# Patient Record
Sex: Male | Born: 1955 | Race: Black or African American | Hispanic: No | Marital: Married
Health system: Midwestern US, Community
[De-identification: ages and names within clinical notes are randomized; demographics above are authoritative.]

## PROBLEM LIST (undated history)

## (undated) DIAGNOSIS — G40409 Other generalized epilepsy and epileptic syndromes, not intractable, without status epilepticus: Principal | ICD-10-CM

## (undated) DIAGNOSIS — Z940 Kidney transplant status: Secondary | ICD-10-CM

## (undated) DIAGNOSIS — E119 Type 2 diabetes mellitus without complications: Secondary | ICD-10-CM

## (undated) DIAGNOSIS — I10 Essential (primary) hypertension: Secondary | ICD-10-CM

## (undated) DIAGNOSIS — I4891 Unspecified atrial fibrillation: Secondary | ICD-10-CM

## (undated) DIAGNOSIS — E785 Hyperlipidemia, unspecified: Secondary | ICD-10-CM

## (undated) DIAGNOSIS — F0150 Vascular dementia without behavioral disturbance: Secondary | ICD-10-CM

## (undated) HISTORY — PX: OTHER SURGICAL HISTORY: SHX169

## (undated) HISTORY — PX: KIDNEY TRANSPLANT: SHX239

---

## 2016-05-27 ENCOUNTER — Inpatient Hospital Stay

## 2016-05-27 ENCOUNTER — Inpatient Hospital Stay
Admit: 2016-05-27 | Discharge: 2016-05-29 | Disposition: A | Discharge status: Home / Self Care | Attending: Internal Medicine | Admitting: Internal Medicine

## 2016-05-27 ENCOUNTER — Emergency Department: Admit: 2016-05-27 | Primary: Family Medicine

## 2016-05-27 DIAGNOSIS — G40909 Epilepsy, unspecified, not intractable, without status epilepticus: Secondary | ICD-10-CM

## 2016-05-27 LAB — METABOLIC PANEL, BASIC
Anion gap: 11 mmol/L (ref 5–15)
BUN: 42 mg/dl — ABNORMAL HIGH (ref 7–25)
CO2: 22 mEq/L (ref 21–32)
Calcium: 10.1 mg/dl (ref 8.5–10.1)
Chloride: 104 mEq/L (ref 98–107)
Creatinine: 2.1 mg/dl — ABNORMAL HIGH (ref 0.6–1.3)
GFR est AA: 42
GFR est non-AA: 35
Glucose: 165 mg/dl — ABNORMAL HIGH (ref 74–106)
Potassium: 4.4 mEq/L (ref 3.5–5.1)
Sodium: 136 mEq/L (ref 136–145)

## 2016-05-27 LAB — PROTHROMBIN TIME + INR
INR: 2.2 — ABNORMAL HIGH (ref 0.1–1.1)
Prothrombin time: 24.7 seconds — ABNORMAL HIGH (ref 10.2–12.9)

## 2016-05-27 LAB — CBC WITH AUTOMATED DIFF
BASOPHILS: 0.3 % (ref 0–3)
EOSINOPHILS: 0.5 % (ref 0–5)
HCT: 45.7 % (ref 37.0–50.0)
HGB: 14.2 gm/dl (ref 12.4–17.2)
LYMPHOCYTES: 10.9 % — ABNORMAL LOW (ref 28–48)
MCH: 24.4 pg (ref 23.0–34.6)
MCHC: 31.1 gm/dl (ref 30.0–36.0)
MCV: 78.4 fL — ABNORMAL LOW (ref 80.0–98.0)
MONOCYTES: 5.1 % (ref 1–13)
NEUTROPHILS: 82.7 % — ABNORMAL HIGH (ref 34–64)
PLATELET: 168 10*3/uL (ref 140–450)
RBC Morphology: NORMAL
RBC: 5.83 M/uL — ABNORMAL HIGH (ref 3.80–5.70)
WBC: 6.1 10*3/uL (ref 4.0–11.0)

## 2016-05-27 LAB — POC URINE MACROSCOPIC
Bilirubin: NEGATIVE
Glucose: NEGATIVE mg/dl
Ketone: NEGATIVE mg/dl
Nitrites: NEGATIVE
Protein: 100 mg/dl — AB
Specific gravity: 1.02 (ref 1.005–1.030)
Urobilinogen: 0.2 EU/dl (ref 0.0–1.0)
pH (UA): 5.5 (ref 5–9)

## 2016-05-27 LAB — POC URINE MICROSCOPIC

## 2016-05-27 LAB — TROPONIN I
Troponin-I: <0.015 ng/ml (ref 0.000–0.045)
Troponin-I: <0.015 ng/ml (ref 0.000–0.045)

## 2016-05-27 MED ORDER — ONDANSETRON (PF) 4 MG/2 ML INJECTION
4 mg/2 mL | Freq: Four times a day (QID) | INTRAMUSCULAR | Status: DC | PRN
Start: 2016-05-27 — End: 2016-05-29
  Administered 2016-05-27 – 2016-05-28 (×2): via INTRAVENOUS

## 2016-05-27 MED ORDER — HYDRALAZINE 20 MG/ML IJ SOLN
20 mg/mL | Freq: Four times a day (QID) | INTRAMUSCULAR | Status: DC | PRN
Start: 2016-05-27 — End: 2016-05-27

## 2016-05-27 MED ORDER — NALOXONE 0.4 MG/ML INJECTION
0.4 mg/mL | INTRAMUSCULAR | Status: DC | PRN
Start: 2016-05-27 — End: 2016-05-29

## 2016-05-27 MED ORDER — CLONIDINE 0.2 MG TAB
0.2 mg | Freq: Three times a day (TID) | ORAL | Status: DC | PRN
Start: 2016-05-27 — End: 2016-05-27

## 2016-05-27 MED ORDER — LABETALOL 5 MG/ML IV SOLN
5 mg/mL | INTRAVENOUS | Status: AC
Start: 2016-05-27 — End: 2016-05-27
  Administered 2016-05-27: 18:00:00 via INTRAVENOUS

## 2016-05-27 MED ORDER — ALBUTEROL SULFATE 0.083 % (0.83 MG/ML) SOLN FOR INHALATION
2.5 mg /3 mL (0.083 %) | Freq: Four times a day (QID) | RESPIRATORY_TRACT | Status: DC | PRN
Start: 2016-05-27 — End: 2016-05-29

## 2016-05-27 MED ORDER — SODIUM CHLORIDE 0.9 % IJ SYRG
Freq: Once | INTRAMUSCULAR | Status: AC
Start: 2016-05-27 — End: 2016-05-27
  Administered 2016-05-27: 14:00:00 via INTRAVENOUS

## 2016-05-27 MED ORDER — ALUM-MAG HYDROXIDE-SIMETH 200 MG-200 MG-20 MG/5 ML ORAL SUSP
200-200-20 mg/5 mL | ORAL | Status: DC | PRN
Start: 2016-05-27 — End: 2016-05-29

## 2016-05-27 MED ORDER — LEVETIRACETAM IN SOD CHLORIDE (ISO-OSM) 500 MG/100 ML IV PIGGY BACK
500 mg/100 mL | INTRAVENOUS | Status: AC
Start: 2016-05-27 — End: 2016-05-27
  Administered 2016-05-27: 16:00:00 via INTRAVENOUS

## 2016-05-27 MED ORDER — ONDANSETRON (PF) 4 MG/2 ML INJECTION
4 mg/2 mL | Freq: Once | INTRAMUSCULAR | Status: AC
Start: 2016-05-27 — End: 2016-05-27
  Administered 2016-05-27: 16:00:00 via INTRAVENOUS

## 2016-05-27 MED ORDER — SODIUM CHLORIDE 0.9 % IJ SYRG
INTRAMUSCULAR | Status: DC | PRN
Start: 2016-05-27 — End: 2016-05-29

## 2016-05-27 MED ORDER — MYCOPHENOLIC ACID 180 MG TAB, DELAYED RELEASE
180 mg | Freq: Two times a day (BID) | ORAL | Status: DC
Start: 2016-05-27 — End: 2016-05-29
  Administered 2016-05-28 – 2016-05-29 (×4): via ORAL

## 2016-05-27 MED ORDER — LEVETIRACETAM 500 MG TAB
500 mg | Freq: Two times a day (BID) | ORAL | Status: DC
Start: 2016-05-27 — End: 2016-05-29
  Administered 2016-05-28 – 2016-05-29 (×4): via ORAL

## 2016-05-27 MED ORDER — SERTRALINE 50 MG TAB
50 mg | Freq: Every day | ORAL | Status: DC
Start: 2016-05-27 — End: 2016-05-29
  Administered 2016-05-28 – 2016-05-29 (×3): via ORAL

## 2016-05-27 MED ORDER — OMEPRAZOLE 20 MG CAP, DELAYED RELEASE
20 mg | Freq: Every day | ORAL | Status: DC
Start: 2016-05-27 — End: 2016-05-29
  Administered 2016-05-29 (×2): via ORAL

## 2016-05-27 MED ORDER — PRAVASTATIN 10 MG TAB
10 mg | Freq: Every evening | ORAL | Status: DC
Start: 2016-05-27 — End: 2016-05-29
  Administered 2016-05-29: 01:00:00 via ORAL

## 2016-05-27 MED ORDER — FLUORESCEIN 1 MG EYE STRIPS
1 mg | OPHTHALMIC | Status: AC
Start: 2016-05-27 — End: 2016-05-27
  Administered 2016-05-27: 15:00:00 via OPHTHALMIC

## 2016-05-27 MED ORDER — WARFARIN 5 MG TAB
5 mg | Freq: Once | ORAL | Status: AC
Start: 2016-05-27 — End: 2016-05-27
  Administered 2016-05-27: 23:00:00 via ORAL

## 2016-05-27 MED ORDER — ACETAMINOPHEN 325 MG TABLET
325 mg | Freq: Four times a day (QID) | ORAL | Status: DC | PRN
Start: 2016-05-27 — End: 2016-05-29
  Administered 2016-05-28 – 2016-05-29 (×2): via ORAL

## 2016-05-27 MED ORDER — PROPARACAINE 0.5 % EYE DROPS
0.5 % | OPHTHALMIC | Status: AC
Start: 2016-05-27 — End: 2016-05-27
  Administered 2016-05-27: 15:00:00 via OPHTHALMIC

## 2016-05-27 MED ORDER — AMLODIPINE 5 MG TAB
5 mg | Freq: Every day | ORAL | Status: DC
Start: 2016-05-27 — End: 2016-05-29
  Administered 2016-05-29: 01:00:00 via ORAL

## 2016-05-27 MED ORDER — HYDROCODONE-ACETAMINOPHEN 5 MG-325 MG TAB
5-325 mg | ORAL | Status: DC | PRN
Start: 2016-05-27 — End: 2016-05-29
  Administered 2016-05-27: 19:00:00 via ORAL

## 2016-05-27 MED ORDER — SODIUM CHLORIDE 0.9 % IJ SYRG
Freq: Three times a day (TID) | INTRAMUSCULAR | Status: DC
Start: 2016-05-27 — End: 2016-05-29
  Administered 2016-05-28 – 2016-05-29 (×5): via INTRAVENOUS

## 2016-05-27 MED ORDER — PHARMACY WARFARIN NOTE
Status: DC
Start: 2016-05-27 — End: 2016-05-29

## 2016-05-27 MED ORDER — CYCLOSPORINE 100 MG CAP
100 mg | Freq: Two times a day (BID) | ORAL | Status: DC
Start: 2016-05-27 — End: 2016-05-29
  Administered 2016-05-28 – 2016-05-29 (×3): via ORAL

## 2016-05-27 MED FILL — FLUORESCEIN 1 MG EYE STRIPS: 1 mg | OPHTHALMIC | Qty: 1

## 2016-05-27 MED FILL — ONDANSETRON (PF) 4 MG/2 ML INJECTION: 4 mg/2 mL | INTRAMUSCULAR | Qty: 2

## 2016-05-27 MED FILL — HYDROCODONE-ACETAMINOPHEN 5 MG-325 MG TAB: 5-325 mg | ORAL | Qty: 1

## 2016-05-27 MED FILL — MYFORTIC 360 MG TABLET,DELAYED RELEASE: 360 mg | ORAL | Qty: 1

## 2016-05-27 MED FILL — PROPARACAINE 0.5 % EYE DROPS: 0.5 % | OPHTHALMIC | Qty: 15

## 2016-05-27 MED FILL — PHARMACY WARFARIN NOTE: Qty: 1

## 2016-05-27 MED FILL — LEVETIRACETAM IN SOD CHLORIDE (ISO-OSM) 500 MG/100 ML IV PIGGY BACK: 500 mg/100 mL | INTRAVENOUS | Qty: 100

## 2016-05-27 MED FILL — SANDIMMUNE 25 MG CAPSULE: 25 mg | ORAL | Qty: 3

## 2016-05-27 MED FILL — LABETALOL 5 MG/ML IV SOLN: 5 mg/mL | INTRAVENOUS | Qty: 20

## 2016-05-27 MED FILL — COUMADIN 5 MG TABLET: 5 mg | ORAL | Qty: 1

## 2016-05-27 NOTE — Other (Signed)
Pt refusing PNA vaccine b/c he is states that he can't have live vaccines.

## 2016-05-27 NOTE — ED Notes (Addendum)
CT here to take patient for testing--  Family asked for nurse because patient wanted to use the bathroom before going to CT-  When nurse entered the room--patient began to wretch--  Patient given emesis bag immediately--already sitting up and in a position to use the bag  Nurse placed urinal on the stretcher--told patient after his nausea and gagging stopped the urinal was there for him  Nurse left to find provider to let them know of patient's nausea and gagging--    Provider not readily available--  Visitor immediately at the desk asking why nurse did not help patient-  Nurse explained that patient was given emesis bag, had urinal available when ready for it and nurse went to try and get medicine for patient to help with the nausea--visitor insists that --that behavior is not providing bedside care  Asked visitor what nurse missed, visitor said she doesn't know--she works at [employer] and no care is being given  Nurse asked visitor what she would have done--that nurse didn't do--  Again, visitor said she didn't know because she was not a nurse  Visitor told patient she could of just provided "care"--guess you can't teach that --  At this time, wife comes out of treatment room--asking for the charge nurse, patient advocate --wanted them all.  ERP Pitrolo informed of visitor's discontent and visitor heard nurse informing doctor so she walked through the nursing station to confront nurse and doctor--Dr Pitrolo went in to patient's room to talk with patient and family and try to calm visitors.  Charge nurse aware of situation and care transferred to [facility].  Patient moved to treatment room 34.      PA Denny Peonrin reports wife wants patient assisted up at the side of the bed ---to use urinal and then she wants patient's blood pressure taken while he is sitting up because she thinks it has something to do with the current situation.    Nurse spoke with Liz BeachGabe EDT and asked him to please assist patient with  using the urinal and take blood pressure while patient is sitting up --per wife request.

## 2016-05-27 NOTE — Progress Notes (Signed)
PAGER ID: [phone]   MESSAGE: Dr. Corinda GublerVerma [id number] Cori RazorMorris, Yehya( SZ) wife is upset because there was a miscommunication with his cyclosporine. She gave his medications tonight and stated she was leaving in the am. if need call [phone]. Thanks. Archie Pattenonya RN

## 2016-05-27 NOTE — H&P (Signed)
Hospitalist Admission History and Physical    NAME:  [name] [name]   DOB:   Jan 22, 1955   MRN:   [id number]     PCP:  Phys Other, MD  Admission Date/Time:  05/27/2016 4:36 PM  Anticipated Date of Discharge: 05/30/16  Anticipated Disposition (home, SNF) : HH         Assessment/Plan:      Active Problems:    Seizure (HCC) (05/27/2016)       Seizure disorder  Metabolic encephalopathy  Status post renal transplant more than one year ago  Hypertensive emergency  Hyperlipidemia  Chronic kidney disease  A. fib      ___________________________________________________  PLAN:    Neuro checks every 4 hours  Control blood pressure, was given labetalol IV push in the emergency room  Restart patient's home medications, clonidine when necessary  Nephrology consulted by emergency room physician-okay for patient staying in the hospital considering his post transplant status  Continue Coumadin pharmacy to dose  Neurology has been consulted for further neuro workup as per neurology    Pressure remained uncontrolled by me to be higher level of care on IV antihypertensive continues infusion      Risk of deterioration:  [] Low    [x] Moderate  [] High    Prophylaxis:  [] Lovenox  [] Coumadin  [x] Hep SQ  [] SCD???s  [] H2B/PPI    Disposition:  [] Home w/ Family   [x] HH PT,OT,RN   [] SNF/LTC   [] SAH/Rehab    Discussed Code Status:    [x] Full Code      [] DNR           Subjective:   CHIEF COMPLAINT:    Chief Complaint   Patient presents with   ??? Seizure       HISTORY OF PRESENT ILLNESS:     [name] [name] is a 61 y.o. BLACK OR [place] AMER[place] male who presents with possible seizure  He was in a hotel having breakfast when he suddenly stopped responding with standing fell hit his head, and went down on the ground and had posturing and clenching behavior with no tonic-clonic activity observed by anybody, no tongue bite or incontinence  Wife by the time she got to him said it seemed like he was confused likely  use after a seizure and was not responsive or speaking and she called paramedics  He has been having some headache discomfort behind his eyes  He had kidney transplant one year one month ago in [facility] and has had 3 episode of possible seizures  With that history with orthostatic hypotension and his blood pressure is usually higher when he is laying down but drops when he is up and is advised to take his blood pressure medication the night when he is ready to sleep  He has been on Keppra for 6 months without any issues  He is visiting here for graduation of his daughter    Past Medical History:   Diagnosis Date   ??? A-fib (HCC)    ??? Chronic kidney disease    ??? ESRD (end stage renal disease) (HCC)    ??? High cholesterol    ??? Hypertension    ??? Kidney transplanted    ??? Orthostatic hypotension    ??? Seizure [facility])         Past Surgical History:   Procedure Laterality Date   ??? HX ARTERIOVENOUS FISTULA     ??? HX RENAL TRANSPLANT         Social History   Substance  Use Topics   ??? Smoking status: Former Smoker   ??? Smokeless tobacco: Never Used   ??? Alcohol use No        Family History   Problem Relation Age of Onset   ??? Hypertension Mother    ??? Diabetes Mother    ??? Hypertension Father    ??? Hypertension Sister         Allergies   Allergen Reactions   ??? Baclofen Other (comments)   ??? Remeron [Mirtazapine] Other (comments)   ??? Vancomycin Other (comments)        Prior to Admission Medications   Prescriptions Last Dose Informant Patient Reported? Taking?   CYCLOSPORINE PO   Yes Yes   Sig: Take 175 mg by mouth two (2) times a day.   OMEPRAZOLE PO   Yes Yes   Sig: Take 20 mg by mouth.   amlodipine besylate (AMLODIPINE PO)   Yes Yes   Sig: Take 5 mg by mouth.   calcitRIOL (ROCALTROL) 0.5 mcg capsule   Yes Yes   Sig: Take 0.25 mcg by mouth daily.   ergocalciferol (ERGOCALCIFEROL) 50,000 unit capsule   Yes Yes   Sig: Take 50,000 Units by mouth.   levetiracetam (KEPPRA PO)   Yes Yes   Sig: Take 500 mg by mouth two (2) times a day.    loperamide HCl (LOPERAMIDE PO)   Yes Yes   Sig: Take 2 mg by mouth as needed.   mycophenolate sodium (MYFORTIC) 360 mg delayed release tablet   Yes Yes   Sig: Take 360 mg by mouth two (2) times a day.   pravastatin sodium (PRAVASTATIN PO)   Yes Yes   Sig: Take 10 mg by mouth nightly.   sertraline HCl (SERTRALINE PO)   Yes Yes   Sig: Take 150 mg by mouth daily.   warfarin sodium (WARFARIN PO)   Yes Yes   Sig: Take  by mouth. 2.5 mg on tuesdays, 5 mg all other days      Facility-Administered Medications: None           REVIEW OF SYSTEMS:     []  Unable to obtain  ROS due to  [] mental status change  [] sedated   [] intubated   [x] Total of 12 systems reviewed as follows:  Constitutional: negative fever, negative chills, negative weight loss  Eyes:     ENT:   negative sore throat, tongue or lip swelling  Respiratory:  negative cough, negative dyspnea  Cards:  negative for chest pain, palpitations, lower extremity edema  GI:   negative for nausea, vomiting, diarrhea, and abdominal pain  Genitourinary: negative for frequency, dysuria  Integument:  negative for rash and pruritus  Hematologic:  negative for easy bruising and gum/nose bleeding  Musculoskel: Positive for fall   Neurological:  Positive for LOC  , headache and confusion   Behavl/Psych: negative for feelings of anxiety, depression       Objective:   VITALS:    Visit Vitals   ??? BP (!) 183/106   ??? Pulse 80   ??? Temp 98.2 ??F (36.8 ??C)   ??? Resp 23   ??? Ht 5\' 9"  (1.753 m)   ??? Wt 69.9 kg (154 lb)   ??? SpO2 100%   ??? BMI 22.74 kg/m2     Temp (24hrs), Avg:98.2 ??F (36.8 ??C), Min:98.2 ??F (36.8 ??C), Max:98.2 ??F (36.8 ??C)      PHYSICAL EXAM:   General:    Alert, cooperative, no distress, appears stated  age.     Head:   Normocephalic, without obvious abnormality, atraumatic.  Eyes:   Conjunctivae clear, anicteric sclerae.  Pupils are equal  Nose:  Nares normal. No drainage or sinus tenderness.  Throat:    Lips, mucosa, and tongue normal.  No Thrush   Neck:  Supple, symmetrical,  no adenopathy, thyroid: non tender    no carotid bruit and no JVD.  Back:    Symmetric,  No CVA tenderness.  Lungs:   Clear to auscultation bilaterally.  No Wheezing or Rhonchi. No rales.  Chest wall:  No tenderness or deformity. No Accessory muscle use.  Heart:   Regular rate and rhythm,  no murmur, rub or gallop.  Abdomen:   Soft, non-tender. Not distended.  Bowel sounds normal. No masses  Extremities: Extremities normal, atraumatic, No cyanosis.  No edema. No clubbing  Skin:     Texture, turgor normal. No rashes or lesions.  Not Jaundiced  Lymph nodes: Cervical, supraclavicular normal.  Psych:  Normal affect   Neurologic:        LAB DATA REVIEWED:    No components found for: [facility]  Recent Labs      05/27/16   1145  05/27/16   0930   NA  136   --    K  4.4   --    CL  104   --    CO2  22   --    BUN  42*   --    CREA  2.1*   --    GLU  165*   --    CA  10.1   --    WBC   --   6.1   HGB   --   14.2   HCT   --   45.7   PLT   --   168     Recent Results (from the past 12 hour(s))   CBC WITH AUTOMATED DIFF    Collection Time: 05/27/16  9:30 AM   Result Value Ref Range    WBC 6.1 4.0 - 11.0 1000/mm3    RBC 5.83 (H) 3.80 - 5.70 M/uL    HGB 14.2 12.4 - 17.2 gm/dl    HCT 29.5 62.1 - 30.8 %    MCV 78.4 (L) 80.0 - 98.0 fL    MCH 24.4 23.0 - 34.6 pg    MCHC 31.1 30.0 - 36.0 gm/dl    PLATELET 657 846 - 962 1000/mm3    NEUTROPHILS 82.7 (H) 34 - 64 %    LYMPHOCYTES 10.9 (L) 28 - 48 %    MONOCYTES 5.1 1 - 13 %    EOSINOPHILS 0.5 0 - 5 %    BASOPHILS 0.3 0 - 3 %    RBC Morphology NORMAL     PROTHROMBIN TIME + INR    Collection Time: 05/27/16  9:30 AM   Result Value Ref Range    Prothrombin time 24.7 (H) 10.2 - 12.9 seconds    INR 2.2 (H) 0.1 - 1.1     TROPONIN I    Collection Time: 05/27/16  9:30 AM   Result Value Ref Range    Troponin-I <0.015 0.000 - 0.045 ng/ml   METABOLIC PANEL, BASIC    Collection Time: 05/27/16 11:45 AM   Result Value Ref Range    Sodium 136 136 - 145 mEq/L     Potassium 4.4 3.5 - 5.1 mEq/L    Chloride 104 98 - 107 mEq/L  CO2 22 21 - 32 mEq/L    Glucose 165 (H) 74 - 106 mg/dl    BUN 42 (H) 7 - 25 mg/dl    Creatinine 2.1 (H) 0.6 - 1.3 mg/dl    GFR est AA 16.1      GFR est non-AA 35      Calcium 10.1 8.5 - 10.1 mg/dl    Anion gap 11 5 - 15 mmol/L   POC URINE MACROSCOPIC    Collection Time: 05/27/16 12:26 PM   Result Value Ref Range    Glucose Negative NEGATIVE,Negative mg/dl    Bilirubin Negative NEGATIVE,Negative      Ketone Negative NEGATIVE,Negative mg/dl    Specific gravity 0.960 1.005 - 1.030      Blood Trace-intact (A) NEGATIVE,Negative      pH (UA) 5.5 5 - 9      Protein 100 (A) NEGATIVE,Negative mg/dl    Urobilinogen 0.2 0.0 - 1.0 EU/dl    Nitrites Negative NEGATIVE,Negative      Leukocyte Esterase Trace (A) NEGATIVE,Negative      Color Yellow      Appearance Clear     POC URINE MICROSCOPIC    Collection Time: 05/27/16 12:26 PM   Result Value Ref Range    Epithelial cells, squamous 1-4 /LPF    WBC 1-4 /HPF    Bacteria OCCASIONAL /HPF   TROPONIN I    Collection Time: 05/27/16  3:44 PM   Result Value Ref Range    Troponin-I <0.015 0.000 - 0.045 ng/ml           IMAGING RESULTS:    Ct Head Wo Cont    Result Date: 05/27/2016  Indication: Seizure Technique:  5 mm axial images were obtained through the head without IV contrast. Comparison: none Findings:  The brain has a normal configuration. The sella, pineal, and craniocervical junction regions are unremarkable.  Orbits, paranasal sinuses, and temporal bones are normal.  No hemorrhage, mass, or infarct. The bony structures are intact.     Impression: Normal noncontrast head CT.         Care Plan discussed with:     [x] Patient   [x] Family    [] ED Care Manager  [] ED Doc   [] Specialist :    Total care time spent on reviewing the case/data/notes/EMR,examining the patient,documentation,coordinating care with nurses and consultants is 41  minutes.       ___________________________________________________   Admitting Physician: Roderic Scarce, MD     Dragon medical dictation software was used for portions of this report.  Unintended voice transcription errors may have occurred.

## 2016-05-27 NOTE — Consults (Signed)
[facility]  CONSULTATION REPORT  NAME:  [name] [name], [name] [name]  SEX:   M  ADMIT: 05/27/2016  DATE OF CONSULT: 05/27/2016  REFERRING PHYSICIAN:    DOB: 01/16/1955  MR#    [id number]  ROOM:  CD14  ACCT#  [id number]    cc: Roderic ScarceSourabh Verma MD    REQUESTING PHYSICIAN:  Dr. Damien Fusiavid Pitrolo and Dr. Roderic ScarceSourabh Verma.    REASON FOR CONSULTATION:  Seizures.    HISTORY OF PRESENT ILLNESS:  This is a 61 year old man with atrial fibrillation, end-stage renal disease, status post renal transplant who is on cyclosporine, and also has history of atrial fibrillation, dyslipidemia, hypertension, orthostatic hypotension, AV fistula, and seizure disorder who is on Keppra 500 mg twice a day.  [place] from [place] gets his care from the neurological standpoint, and also [place].  He was visiting this area because he had attended a graduation ceremony yesterday, and today at breakfast in the hotel he had a convulsive seizure which was witnessed by his wife.  He did not lose bladder control or bite his tongue.  He does not have much recollection of the event, but his wife had the description which fits the description of a convulsive seizure.  They called 911 and EMS arrived, and the patient was slowly awakening.  Then he was brought to the emergency room in our facility where he had a head CT, and I have personally looked at the CT films and it showed no acute intracranial process and specifically, no evidence of acute hemorrhage or stroke.  Labs showed normal CBC with differential and normal basic metabolic panel except for BUN of 42, creatinine of 2.1, and GFR of 42.  Troponin was negative.  INR was 2.2.  The patient currently denies headache, double vision, blurriness of vision, chest pain, or shortness of breath.    REVIEW OF SYSTEMS:  A comprehensive 14-point review of the system was obtained, and the pertinent ones are mentioned under the HPI section and all other systems  are negative.    PAST MEDICAL HISTORY:  As outlined above.    SOCIAL HISTORY:  Former smoker, quit many years ago.  No alcohol abuse.    FAMILY HISTORY:  Hypertension, diabetes.    ALLERGIES:  BACLOFEN, REMERON, VANCOMYCIN.    MEDICATIONS:  As per Epic.    PHYSICAL EXAMINATION:  VITAL SIGNS:  Temperature 98.8, heart rate 85, respiratory rate 14, blood pressure 175/109.  The patient has regular pulse.  GENERAL APPEARANCE:  A well-developed and well-nourished man in no apparent distress.  NEUROLOGIC:  He has a normocephalic and atraumatic head.  He is awake, alert, oriented times 3.  He has normal language, fund of knowledge, attention span and concentration, and normal memory.  Cranial nerve II is normal, III is normal, IV is normal, V is normal, VI is normal, VII is normal, VIII is normal, IX is normal, X is normal, XI is normal, XII is normal.  Funduscopic exam is normal.  He has normal muscle strength and muscle tone.  DTRs are 1/4 symmetrically.  There are no pathological reflexes.  There is no dysmetria.  Sensory exam is normal to light touch.  The patient's gait and station are normal.    ASSESSMENT AND PLAN:  This 61 year old man presented with breakthrough seizures.  I am obtaining an MRI of the brain as well as EEG for further evaluation.  Obviously, given the fact that he is a  renal transplant patient, we want to make sure that we obtain an MRI of the brain since he is immunocompromised patient and there is always concern for some underlying CNS infectious or opportunistic causes which could contribute to seizures.  Also, neoplasm is always going to be in the differential diagnosis as well and therefore, I am obtaining MRI of the brain.  In regard to the medication, Keppra is currently 500 mg twice a day and despite that the patient had seizure, and since is a renal patient my recommendation will be to probably switch him to a different drug such as Depakote or Vimpat which do not have renal  side effects.  I discussed this over the phone with the patient's transplant team in [place] and they are in agreement with me in this regard.  I am not making any immediate change during this visit because most likely patient will be discharged from our facility soon and will have followup with his neurologist in [place], but this is my recommendations to switch the patient to either Depakote or to Vimpat.  I recommend full seizure precautions.  I am also checking the cyclosporine level.  Also, Keppra level was sent out.      The case was discussed with Dr. Corinda Gubler and also with the patient's transplant physician in [place] and also with the patient and his wife at the bedside.      ___________________  Charlesetta Ivory MD  Dictated By:.   AC  D:05/27/2016 21:43:26  T: 05/27/2016 22:32:44  [id number]

## 2016-05-27 NOTE — Other (Signed)
Text paged MD: wife is at bedside and would like to speak to you. Also, wife concerned about giving him meds to control his BP during daytime.  Trish [phone]  Will continue to monitor.

## 2016-05-27 NOTE — ED Notes (Signed)
Patient wife brought bag of medicine bottles.  Asked wife for the bag so nurse could put the doses on the medication list.  Wife became hostile and said no--she would not let nurse have the bottles  Nurse assured wife she would bring them right back--just need to get the doses off the bottles  Wife became even more hostile and said she was not giving the nurse the bag or the bottles  Told nurse to get the charge nurse --or someone else but she was not giving nurse the bag.  Nurse said that was fine, she could log in to the bedside computer and wife could read off the doses--  Nurse logged in to the bedside computer--wife started rattling off drug names and doses before nurse could even get to the right screen--nothing said by nurse--just started to enter them as the screen was ready--after she finished--  Nurse asked to clarify the 2-3 meds she did not get when wife was rattling them off before the computer was on--    Wife reported that their daughter just graduated from [employer]--  Nurse congratulated wife and patient--nothing more wonderful than college graduation  Wife talked about her plans to pursue a graduate degree in biology--  Nurse told wife and family that her sister pursued biology in her graduate program and now works for [employer].    Again, nurse congratulated patient and wife ---trying to de-escalate wife from her hostile attitude.

## 2016-05-27 NOTE — ED Triage Notes (Signed)
Per EMS  Was at the Hotel--visiting for a graduation  Patient fell into the wall--slid to the floor--was diaphoretic and altered  Patient's wife--reports that is how he is after a seizure  Arrives awake--  Accucheck 189

## 2016-05-27 NOTE — Progress Notes (Addendum)
Went  to patient's bedside after asked by nursing to see the patient  Met patient's wife who I did not meet while admitted the patient in the emergency room  Apparently patient wife was there when he was in the emergency room when emergency room physician admitted him to our service  I had asked the emergency room physician to confirm with on-call nephrologist to make sure patient is appropriate to stay in this hospital considering his history of renal transplant which he okayed    Discussed with patient's wife about his workup and plan    She seemed to be upset that this small hospital cannot manage him and he is too complex for the doctors here in the nursing staff  , she   was on call with the transplant coordinator, and while I tried  to summarize on what we're planning to do here in the hospital she interrupted me by saying  to listen to her rather than talking myself   She told me to get out of the room while I stayed in and listened  to the conversation after which she calmed down  Apologized and continue to listen to the transplant coordinator who said she is not sure for  her blood pressure control should be using hydralazine or clonidine and she will have to confirm this with her physician  Wife also concerned about nephrologist hasn't seen the patient who made the determination that patient can stay in this hospital  She is upset that everybody in the nursing staff is asking that what to do when we should be knowing what to do and she is determined that they do not want him to be here as she had told the emergency room physician in the emergency room because we cannot handle complex patient like him here     She also said that he is one of the few cases of a new transplant  At [facility] in [place] who has survived and they do not want him to be having transplant rejection and will want to go to a [facility] or [facility] in [place]    Reassured her that we will try to get in touch with [facility] in [place] while she said her transplant coordinator will also call the [facility] locally to see if we can expedite his transfer  Spoke to nursing supervisor who will bring the numbers for transfer center for [place] and let me know

## 2016-05-27 NOTE — ED Notes (Signed)
IV Keppra has just arrived from pharmacy--medication [facility] to [facility]--

## 2016-05-27 NOTE — ED Notes (Signed)
Assumed care of pt.   Verbal report received from EddyvilleLisa, Charity fundraiserN.

## 2016-05-27 NOTE — Progress Notes (Signed)
Went to clarify if wife was ok with pt getting a cyclosporin level drawn before medication given tonight, because intially I was told it would drawn in am. Wife was upset that it was now 2032 and he had not been given his cyclosporin, keppra or myfortic which he usually get at 8am/8pm at home.  She stated she was told that he was not getting his level until 0730  5/21 but for some reason it was order for both 2000 5/20 and also 0730 5/2.   She stated "you all are not killing my husband" and stated she will give his medications to him and that she wanted to speak with the charge nurse.  She wrote on the board and also informed me of what she gave him and to make sure his lab for the cyclosporin is drawn at 0800 in the morning 5/21.  She also stated that she will be leaving here in the am.

## 2016-05-27 NOTE — Progress Notes (Signed)
[facility] Dosing Services: Warfarin    Consult for Warfarin Dosing by Pharmacy by Dr. Corinda GublerVerma  Consult provided for this 61 y.o.  male , for indication of Thromboembolism (Prevent with Chronic Atrial Fibrillation)    Dose to achieve an INR goal of 2-3    Order entered for  Warfarin  5 (mg) ordered to be given today at 18:00.     Significant drug interactions:   Previous dose given 2.5 mg on Tuesdays and 5 mg everyday but Tuesday (home dose)   PT/INR Lab Results   Component Value Date/Time    INR 2.2 (H) 05/27/2016 09:30 AM      Platelets Lab Results   Component Value Date/Time    PLATELET 168 05/27/2016 09:30 AM      H/H Lab Results   Component Value Date/Time    HGB 14.2 05/27/2016 09:30 AM        Pharmacy to follow daily and will provide subsequent Warfarin dosing based on clinical status.  Evie LacksMia Kwabia, PHARMD)  Contact information [phone]

## 2016-05-27 NOTE — Progress Notes (Signed)
Called and discussed case with Jan, [facility]. [phone]. Confirms on myfortic and cyclosporine. Not on prednisone. Creatinine of  2.1 at about baseline. No recent trp issues. Orthostatic BP issues for 6 months. Reports on cyclosporine after one year of prograf due to tremor.     Discussed with Dr Corinda GublerVerma  Check cortisol level in am  Check cyclosporine level before dose tonight  Called wife #[phone] line busy  Dr Rondel Jumbourley f/u in AM

## 2016-05-27 NOTE — Other (Signed)
CD 14, [name] [name], BP 183/106.  Do you want to give him something and also, where do you want his BP to be closer to?  Thank you, Trish [phone]

## 2016-05-27 NOTE — Other (Signed)
Pt's BP was noted 183/106.  Wife stated that no medication should be given at this time because she's worried that if he gets up, his BP will plummet.  Wife also asked to speak to MD taking care of her husband.  Will notify MD.

## 2016-05-27 NOTE — ED Notes (Signed)
Visitor asked for a blanket.  Nurse provided blanket--  Visitor proceeded to put it on the patient and tell him it was not a warm blanket like they have at [facility]--just a plain old blanket.

## 2016-05-27 NOTE — Progress Notes (Addendum)
Spoke with physician on call Dr Loleta [facility]aboulatta at [facility]  Went over patient's history and presentation and the request by the wife to be transferred to the hospital  After discussion about patient's possible possibilities wise in the hospital out of which seizure is one of them, the physician there told me that the [facility] does not have a EEG technologist who can do EEG for the last 2 years and will be unreasonable to transfer this patient to the hospital    Spoke with dr q , who will order mri tonight and he has seen pt and discussed with wife     Spoke with dr Arelia Longestzydlewski , nephrology - gave him number for [facility] and [facility] transplant clinic in [place] , he will call them both to clarify transplant questions

## 2016-05-27 NOTE — ED Notes (Addendum)
Late entry    1125 Per ER MD pt is okay to take home medications which include anti-rejection mediations.     1130 Pt vomiting at this time. All home medications were in emesis bag.  MD made aware.    1130 4 mg of IV Zofran given per MD order  (See MAR)

## 2016-05-27 NOTE — ED Provider Notes (Signed)
[facility]  Emergency Department Treatment Report        Patient: [name] [name] Age: 61 y.o. Sex: male    Date of Birth: [birth year] Admit Date: 05/27/2016 PCP: Sol Blazing, MD   MRN: [id number]  CSN: [id number]     Room: ER34/ER34 Time Dictated: 10:14 AM      Attending MD: Elsie Saas, MD  APC:  Marlana Salvage PA-C    Chief Complaint   Seizure    History of Present Illness   61 y.o. male presents to the ER for evaluation after a seizure event according to family. He was at a breakfast bar at a hotel when suddenly he stopped responding, fell and hit his head on something while standing, then went down to the ground. Apparently had posturing, clenching behavior but no tonic-clonic activity, didn't urinate on himself. By the time wife got to him he seemed like he does after a seizure.  She states he wasn't speaking or very responsive. 911 was called, by the time EMS arrived he was a little more aroused, in route started talking more, here why states he's talking to her and seems more himself but is not back to normal.    He complains to me of right eye pain and headache. States the eye discomfort feels like pressure. Denies chest pain or shortness of breath. Denies neck pain, back pain, injuries from the fall.    No recent illness, fevers, cough, vomiting, diarrhea. States that his history is complex in that he has had renal transplant and is on antirejection medications, has had 3 episodes of what are thought to be seizures, however each episode is different. This one resembled one that happened 2 times ago. He's been on Keppra for 6 months without any reaction.    Review of Systems   Review of Systems   Constitutional: Negative for malaise/fatigue.        No recent illness, fever, chills   HENT: Negative for nosebleeds.    Eyes: Positive for pain (right eye pressure). Negative for redness.   Respiratory: Negative for cough and shortness of breath.    Cardiovascular: Negative for chest pain.    Gastrointestinal: Negative for abdominal pain, diarrhea, nausea and vomiting.   Genitourinary: Negative for dysuria.   Musculoskeletal: Positive for falls. Negative for back pain, joint pain and neck pain.   Skin: Negative for rash.        No laceration   Neurological: Positive for seizures (? vs syncope), loss of consciousness and headaches. Negative for dizziness, tingling and speech change.     Past Medical/Surgical History     Past Medical History:   Diagnosis Date   ??? A-fib (HCC)    ??? Chronic kidney disease    ??? ESRD (end stage renal disease) (HCC)    ??? High cholesterol    ??? Hypertension    ??? Kidney transplanted    ??? Orthostatic hypotension    ??? Seizure [facility])      Past Surgical History:   Procedure Laterality Date   ??? HX ARTERIOVENOUS FISTULA     ??? HX RENAL TRANSPLANT       Social History     Social History     Social History   ??? Marital status: MARRIED     Spouse name: N/A   ??? Number of children: N/A   ??? Years of education: N/A     Occupational History   ??? Not on file.  Social History Main Topics   ??? Smoking status: Former Smoker   ??? Smokeless tobacco: Never Used   ??? Alcohol use No   ??? Drug use: Yes     Special: Marijuana   ??? Sexual activity: Not on file     Other Topics Concern   ??? Not on file     Social History Narrative   ??? No narrative on file     Family History     Family History   Problem Relation Age of Onset   ??? Hypertension Mother    ??? Diabetes Mother    ??? Hypertension Father    ??? Hypertension Sister      Current Medications     Prior to Admission Medications   Prescriptions Last Dose Informant Patient Reported? Taking?   CYCLOSPORINE PO   Yes Yes   Sig: Take 175 mg by mouth two (2) times a day.   OMEPRAZOLE PO   Yes Yes   Sig: Take 20 mg by mouth.   amlodipine besylate (AMLODIPINE PO)   Yes Yes   Sig: Take 5 mg by mouth.   calcitRIOL (ROCALTROL) 0.5 mcg capsule   Yes Yes   Sig: Take 0.25 mcg by mouth daily.   ergocalciferol (ERGOCALCIFEROL) 50,000 unit capsule   Yes Yes    Sig: Take 50,000 Units by mouth.   levetiracetam (KEPPRA PO)   Yes Yes   Sig: Take 500 mg by mouth two (2) times a day.   loperamide HCl (LOPERAMIDE PO)   Yes Yes   Sig: Take 2 mg by mouth as needed.   mycophenolate sodium (MYFORTIC) 360 mg delayed release tablet   Yes Yes   Sig: Take 360 mg by mouth two (2) times a day.   pravastatin sodium (PRAVASTATIN PO)   Yes Yes   Sig: Take 10 mg by mouth nightly.   sertraline HCl (SERTRALINE PO)   Yes Yes   Sig: Take 150 mg by mouth daily.   warfarin sodium (WARFARIN PO)   Yes Yes   Sig: Take  by mouth. 2.5 mg on tuesdays, 5 mg all other days      Facility-Administered Medications: None     Allergies     Allergies   Allergen Reactions   ??? Baclofen Other (comments)   ??? Remeron [Mirtazapine] Other (comments)   ??? Vancomycin Other (comments)     Physical Exam   ED Triage Vitals   ED Encounter Vitals Group      BP 05/27/16 0921 166/112      Pulse (Heart Rate) 05/27/16 0921 97      Resp Rate 05/27/16 0921 25      Temp 05/27/16 0931 98.2 ??F (36.8 ??C)      Temp src --       O2 Sat (%) 05/27/16 0921 100 %      Weight 05/27/16 0931 154 lb      Height 05/27/16 0931 5\' 9"      Physical Exam   Constitutional: He is oriented to person, place, and time. He appears not dehydrated. He does not have a sickly appearance.   Patient is resting with eyes closed, responds to commands, doesn't appear acutely distressed but does look mildly uncomfortable   Eyes: Conjunctivae, EOM and lids are normal. Pupils are equal, round, and reactive to light. Right conjunctiva is not injected. Left conjunctiva is not injected. No scleral icterus.   I used proparacaine and fluorescein to right eye, no uptake on fluorescein staining.  Tono-Pen pressures were normal bilaterally.   Cardiovascular: Normal rate, regular rhythm and intact distal pulses.    No murmur heard.  Pulmonary/Chest: Effort normal and breath sounds normal.   Abdominal: Soft. There is no tenderness. There is no rebound and no guarding.    Musculoskeletal:   Patient doesn't complain of pain in the C-spine, T-spine, LS-spine on exam. Upper and lower extremities intact with normal range of motion without pain.   Neurological: He is alert and oriented to person, place, and time. He has normal sensation and normal strength. He is not disoriented. He displays no tremor, facial symmetry and normal speech. Coordination normal. GCS score is 15.   Patient is arousable, answers questions appropriate. He does seem to fall back to sleep easily, but is alert and oriented ??3. Follows commands.   Skin: He is not diaphoretic.   No lacerations or abrasions   Psychiatric: Memory and affect normal.     Impression and Management Plan   Patient presenting with seizure versus syncope. Wife states she's had 3 prior episodes, recently started on Keppra 6 months ago which she's been compliant with. He did have some of a postictal state that his symptoms were not classic for a tonic-clonic seizure. He is on Coumadin and hit his head, we'll obtain a head CT given that he has a headache and some eye pressure. Has a history of chronic renal failure status post transplant so we'll check his renal function and electrolytes. We'll check for anemia, elevated white count. Get an EKG for any arrhythmia in the setting of possible syncope.    Blood pressure is very elevated. Wife states that when he sits or stands his pressure gets better. He is recently been on amlodipine only at night as his pressure only elevates when he lays flat. If head CT is negative for bleeding, consider treatment of persistent. We'll get orthostatic vitals.    Diagnostic Studies   Lab:   Recent Results (from the past 12 hour(s))   CBC WITH AUTOMATED DIFF    Collection Time: 05/27/16  9:30 AM   Result Value Ref Range    WBC 6.1 4.0 - 11.0 1000/mm3    RBC 5.83 (H) 3.80 - 5.70 M/uL    HGB 14.2 12.4 - 17.2 gm/dl    HCT 16.1 09.6 - 04.5 %    MCV 78.4 (L) 80.0 - 98.0 fL    MCH 24.4 23.0 - 34.6 pg     MCHC 31.1 30.0 - 36.0 gm/dl    PLATELET 409 811 - 914 1000/mm3    NEUTROPHILS 82.7 (H) 34 - 64 %    LYMPHOCYTES 10.9 (L) 28 - 48 %    MONOCYTES 5.1 1 - 13 %    EOSINOPHILS 0.5 0 - 5 %    BASOPHILS 0.3 0 - 3 %    RBC Morphology NORMAL     PROTHROMBIN TIME + INR    Collection Time: 05/27/16  9:30 AM   Result Value Ref Range    Prothrombin time 24.7 (H) 10.2 - 12.9 seconds    INR 2.2 (H) 0.1 - 1.1     TROPONIN I    Collection Time: 05/27/16  9:30 AM   Result Value Ref Range    Troponin-I <0.015 0.000 - 0.045 ng/ml   METABOLIC PANEL, BASIC    Collection Time: 05/27/16 11:45 AM   Result Value Ref Range    Sodium 136 136 - 145 mEq/L    Potassium 4.4 3.5 - 5.1  mEq/L    Chloride 104 98 - 107 mEq/L    CO2 22 21 - 32 mEq/L    Glucose 165 (H) 74 - 106 mg/dl    BUN 42 (H) 7 - 25 mg/dl    Creatinine 2.1 (H) 0.6 - 1.3 mg/dl    GFR est AA 32.4      GFR est non-AA 35      Calcium 10.1 8.5 - 10.1 mg/dl    Anion gap 11 5 - 15 mmol/L   POC URINE MACROSCOPIC    Collection Time: 05/27/16 12:26 PM   Result Value Ref Range    Glucose Negative NEGATIVE,Negative mg/dl    Bilirubin Negative NEGATIVE,Negative      Ketone Negative NEGATIVE,Negative mg/dl    Specific gravity 4.010 1.005 - 1.030      Blood Trace-intact (A) NEGATIVE,Negative      pH (UA) 5.5 5 - 9      Protein 100 (A) NEGATIVE,Negative mg/dl    Urobilinogen 0.2 0.0 - 1.0 EU/dl    Nitrites Negative NEGATIVE,Negative      Leukocyte Esterase Trace (A) NEGATIVE,Negative      Color Yellow      Appearance Clear     POC URINE MICROSCOPIC    Collection Time: 05/27/16 12:26 PM   Result Value Ref Range    Epithelial cells, squamous 1-4 /LPF    WBC 1-4 /HPF    Bacteria OCCASIONAL /HPF     Labs Reviewed   CBC WITH AUTOMATED DIFF - Abnormal; Notable for the following:        Result Value    RBC 5.83 (*)     MCV 78.4 (*)     NEUTROPHILS 82.7 (*)     LYMPHOCYTES 10.9 (*)     All other components within normal limits   PROTHROMBIN TIME + INR - Abnormal; Notable for the following:      Prothrombin time 24.7 (*)     INR 2.2 (*)     All other components within normal limits   METABOLIC PANEL, BASIC - Abnormal; Notable for the following:     Glucose 165 (*)     BUN 42 (*)     Creatinine 2.1 (*)     All other components within normal limits   POC URINE MACROSCOPIC - Abnormal; Notable for the following:     Blood Trace-intact (*)     Protein 100 (*)     Leukocyte Esterase Trace (*)     All other components within normal limits   TROPONIN I   LEVETIRACETAM (KEPPRA)   POC URINE MICROSCOPIC       Imaging:    Ct Head Wo Cont    Result Date: 05/27/2016  Indication: Seizure Technique:  5 mm axial images were obtained through the head without IV contrast. Comparison: none Findings:  The brain has a normal configuration. The sella, pineal, and craniocervical junction regions are unremarkable.  Orbits, paranasal sinuses, and temporal bones are normal.  No hemorrhage, mass, or infarct. The bony structures are intact.     Impression: Normal noncontrast head CT.     EKG Interpretation:  Dr. Cipriano Mile did not see any acute S-T segment or T-wave abnormalities that are consistent with acute ischemia or infarction.    ED Course   Patient was maintained on cardiac, oxygen, and blood pressure monitoring without arrhythmia or event.    Patient Vitals for the past 12 hrs:   Temp Pulse Resp BP SpO2   05/27/16 1332 -  90 17 (!) 196/140 99 %   05/27/16 1120 - - - (!) 198/123 -   05/27/16 1116 - 98 27 125/72 100 %   05/27/16 1001 - 90 22 (!) 196/118 100 %   05/27/16 0946 - 91 21 (!) 196/115 100 %   05/27/16 0931 98.2 ??F (36.8 ??C) 92 21 (!) 171/116 100 %   05/27/16 0921 - 97 25 (!) 166/112 100 %       ED Course   Value Comment By Time    Discussed the patient's history, physical and plan with Dr. Cipriano Mile who is in agreement. Julien Girt, PA-C 05/20 1029    Patient re-evaluated as he started having vomiting suddenly with nausea.  Right sided headache is mildly worse. Julien Girt, PA-C 05/20 1048    Troponin-I: <0.015 (Reviewed) Julien Girt, PA-C 05/20 1151   Prothrombin time: (!) 24.7 (Reviewed) Julien Girt, PA-C 05/20 1151   INR: (!) 2.2 (Reviewed) Julien Girt, PA-C 05/20 1151   WBC: 6.1 (Reviewed) Julien Girt, PA-C 05/20 1151   CT HEAD WO CONT Impression: Normal noncontrast head CT. Julien Girt, PA-C 05/20 1151    Reviewed patient's test results with Dr. Cipriano Mile. PICC team just left for the redraw on his chemistry, still pending. Dr. Cipriano Mile is updating family about test results, reevaluating him. According to RN patient is alert, vomiting has stopped. Julien Girt, PA-C 05/20 1219    Discussed elevated blood pressure with patient and his wife. His elevated when he lays down, when he sits up or stands up it improves and becomes more normal. They state this is normal for him which is why he takes amlodipine at night. Julien Girt, PA-C 05/20 1241    I checked the patient's right eye as he complained of pressure.      No uptake seen with fluorescein staining.    Eye pressure is 18 in the right eye, 18 in the left eye Julien Girt, PA-C 05/20 1243    Dr. Cipriano Mile spoke further with family.  Given breakthrough seizure on Keppra vs syncope, advised placement in hospital.  They are in agreement at this time.  Page placed for hospitalist for Dr. Cipriano Mile to discuss. Julien Girt, PA-C 05/20 1252    Dr. Cipriano Mile discussed with Dr. Corinda Gubler who accepted for admission in step-down.  Family in agreement with plan.  He discussed creatinine 2.1-- last checked 1.9 per family. Julien Girt, PA-C 05/20 1316       Medications   sodium chloride (NS) flush 5-10 mL (10 mL IntraVENous Given 05/27/16 1009)   levETIRAcetam (KEPPRA) 500 mg in saline (iso-osm) 100 ml IVPB (0 mg IntraVENous IV Completed 05/27/16 1227)   ondansetron (ZOFRAN) injection 4 mg (4 mg IntraVENous Given 05/27/16 1130)   proparacaine (OPTHAINE) 0.5 % ophthalmic solution 1 Drop (1 Drop Both Eyes Given by Provider 05/27/16 1103)    fluorescein (FUL-GLO) 1 mg ophthalmic strip 1 Strip (1 Strip Both Eyes Given by Provider 05/27/16 1103)       Medical Decision Making   Patient here with seizure (on keppra) versus syncope. Head CT is unremarkable. Renal function per Dr. Cipriano Mile was discussed with his wife, is not far from baseline with creatinine last checked at 1.9. Mental status is improved here gradually, no further sz/syncopal activity.  He's not had any arrhythmia on the monitor. Laboratory studies otherwise unremarkable. Dr. Cipriano Mile has discussed with Dr. Corinda Gubler, the patient was placed in  stepdown, currently they are discussing treatment for his persistently elevated pressures, which did improve with sitting/standing as noted by family as normal for him.    Final Diagnosis       ICD-10-CM ICD-9-CM   1. 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 Syncope, unspecified syncope type R55 780.2   3. Orthostatic hypotension I95.1 458.0     Disposition   Stepdown admission to Dr. Corinda GublerVerma    The patient was personally evaluated by myself and DAVID Orland DecA PITROLO, MD who agrees with the above assessment and plan.    Kade Rickels E. Ronnie Derbycenbice, PA-C  May 27, 2016    My signature above authenticates this document and my orders, the final ??  diagnosis (es), discharge prescription (s), and instructions in the Epic ??  record.  If you have any questions please contact [phone].  ??  Nursing notes have been reviewed by the physician/ advanced practice ??  Clinician.    Dragon medical dictation software was used for portions of this report. Unintended voice recognition errors may occur.

## 2016-05-27 NOTE — ED Notes (Signed)
TRANSFER - OUT REPORT:    Verbal report given to Trish, RN (name) on [name] [name]  being transferred to CDU 14 (unit) for routine progression of care       Report consisted of patient???s Situation, Background, Assessment and   Recommendations(SBAR).     Information from the following report(s) SBAR, ED Summary, [facility] and Recent Results was reviewed with the receiving nurse.    Lines:   Peripheral IV 05/27/16 Right Hand (Active)   Dressing Status Clean, dry, & intact 05/27/2016  9:30 AM   Dressing Type Transparent 05/27/2016  9:30 AM   Hub Color/Line Status Flushed 05/27/2016  9:30 AM       Peripheral IV 05/27/16 Right Antecubital (Active)        Opportunity for questions and clarification was provided.      Patient transported with:   Registered Nurse  Tech

## 2016-05-27 NOTE — Consults (Signed)
[facility]  CONSULTATION [facility]  NAME:  [name] [name], [name] [name]  SEX:   M  ADMIT: 05/27/2016  DATE OF CONSULT: 05/27/2016  REFERRING PHYSICIAN:    DOB: [birth year]  MR#    [id number]  ROOM:  CD14  ACCT#  [id number]    cc: Roderic ScarceSourabh Verma MD    REQUESTING PHYSICIAN:  Dr. Damien Fusiavid Pitrolo and Dr. Roderic ScarceSourabh Verma.    REASON FOR CONSULTATION:  Seizures.    HISTORY OF PRESENT ILLNESS:  This is a 61 year old man with atrial fibrillation, end-stage renal disease, status post renal transplant who is on cyclosporine, and also has history of atrial fibrillation, dyslipidemia, hypertension, orthostatic hypotension, AV fistula, and seizure disorder who is on Keppra 500 mg twice a day.  [place] from the neurological standpoint, and also [place] [facility]  He was visiting this area because he had attended a graduation ceremony yesterday, and today at breakfast in the hotel he had a convulsive seizure which was witnessed by his wife.  He did not lose bladder control or bite his tongue.  He does not have much recollection of the event, but his wife had the description which fits the description of a convulsive seizure.  They called 911 and EMS arrived, and the patient was slowly awakening.  Then he was brought to the emergency room in our facility where he had a head CT, and I have personally looked at the CT films and it showed no acute intracranial process and specifically, no evidence of acute hemorrhage or stroke.  Labs showed normal CBC with differential and normal basic metabolic panel except for BUN of 42, creatinine of 2.1, and GFR of 42.  Troponin was negative.  INR was 2.2.  The patient currently denies headache, double vision, blurriness of vision, chest pain, or shortness of breath.    REVIEW OF SYSTEMS:  A comprehensive 14-point review of the system was obtained, and the pertinent ones are mentioned under the HPI section and all other systems  are negative.    PAST MEDICAL HISTORY:  As outlined above.    SOCIAL HISTORY:  Former smoker, quit many years ago.  No alcohol abuse.    FAMILY HISTORY:  Hypertension, diabetes.    ALLERGIES:  BACLOFEN, REMERON, VANCOMYCIN.    MEDICATIONS:  As per Epic.    PHYSICAL EXAMINATION:  VITAL SIGNS:  Temperature 98.8, heart rate 85, respiratory rate 14, blood pressure 175/109.  The patient has regular pulse.  GENERAL APPEARANCE:  A well-developed and well-nourished man in no apparent distress.  NEUROLOGIC:  He has a normocephalic and atraumatic head.  He is awake, alert, oriented times 3.  He has normal language, fund of knowledge, attention span and concentration, and normal memory.  Cranial nerve II is normal, III is normal, IV is normal, V is normal, VI is normal, VII is normal, VIII is normal, IX is normal, X is normal, XI is normal, XII is normal.  Funduscopic exam is normal.  He has normal muscle strength and muscle tone.  DTRs are 1/4 symmetrically.  There are no pathological reflexes.  There is no dysmetria.  Sensory exam is normal to light touch.  The patient's gait and station are normal.    ASSESSMENT AND PLAN:  This [age] year old man presented with breakthrough seizures.  I am obtaining an MRI of the brain as well as EEG for further evaluation.  Obviously, given the fact that he is a  renal transplant patient, we want to make sure that we obtain an MRI of the brain since he is immunocompromised patient and there is always concern for some underlying CNS infectious or opportunistic causes which could contribute to seizures.  Also, neoplasm is always going to be in the differential diagnosis as well and therefore, I am obtaining MRI of the brain.  In regard to the medication, Keppra is currently 500 mg twice a day and despite that the patient had seizure, and since is a renal patient my recommendation will be to probably switch him to a different drug such as Depakote or Vimpat which do not have renal side effects.   I discussed this over the phone with the patient's transplant team in [place] and they are in agreement with me in this regard.  I am not making any immediate change during this visit because most likely patient will be discharged from our facility soon and will have followup with his neurologist in [place], but this is my recommendations to switch the patient to either Depakote or to Vimpat.  I recommend full seizure precautions.  I am also checking the cyclosporine level.  Also, Keppra level was sent out.      The case was discussed with Dr. Corinda Gubler and also with the patient's transplant physician in [place] and also with the patient and his wife at the bedside.      ___________________  Charlesetta Ivory MD  Dictated By:.   AC  D:05/27/2016 21:43:26  T: 05/27/2016 22:32:44  [id number]

## 2016-05-28 ENCOUNTER — Inpatient Hospital Stay: Primary: Family Medicine

## 2016-05-28 LAB — CBC WITH AUTOMATED DIFF
BASOPHILS: 0.1 % (ref 0–3)
EOSINOPHILS: 0 % (ref 0–5)
HCT: 40.2 % (ref 37.0–50.0)
HGB: 12.8 gm/dl (ref 12.4–17.2)
IMMATURE GRANULOCYTES: 0.3 % (ref 0.0–3.0)
LYMPHOCYTES: 9.7 % — ABNORMAL LOW (ref 28–48)
MCH: 24.6 pg (ref 23.0–34.6)
MCHC: 31.8 gm/dl (ref 30.0–36.0)
MCV: 77.2 fL — ABNORMAL LOW (ref 80.0–98.0)
MONOCYTES: 9.4 % (ref 1–13)
NEUTROPHILS: 80.5 % — ABNORMAL HIGH (ref 34–64)
NRBC: 0 (ref 0–0)
PLATELET: 162 10*3/uL (ref 140–450)
RBC: 5.21 M/uL (ref 3.80–5.70)
RDW-SD: 49.5 — ABNORMAL HIGH (ref 35.1–43.9)
WBC: 7 10*3/uL (ref 4.0–11.0)

## 2016-05-28 LAB — METABOLIC PANEL, BASIC
Anion gap: 10 mmol/L (ref 5–15)
BUN: 41 mg/dl — ABNORMAL HIGH (ref 7–25)
CO2: 22 mEq/L (ref 21–32)
Calcium: 9.3 mg/dl (ref 8.5–10.1)
Chloride: 103 mEq/L (ref 98–107)
Creatinine: 2.1 mg/dl — ABNORMAL HIGH (ref 0.6–1.3)
GFR est AA: 42
GFR est non-AA: 35
Glucose: 87 mg/dl (ref 74–106)
Potassium: 4.5 mEq/L (ref 3.5–5.1)
Sodium: 134 mEq/L — ABNORMAL LOW (ref 136–145)

## 2016-05-28 LAB — CORTISOL: Cortisol, random: 18.47 ug/dL

## 2016-05-28 LAB — PROTHROMBIN TIME + INR
INR: 2.7 — ABNORMAL HIGH (ref 0.1–1.1)
Prothrombin time: 30.8 seconds — ABNORMAL HIGH (ref 10.2–12.9)

## 2016-05-28 LAB — TROPONIN I: Troponin-I: <0.015 ng/ml (ref 0.000–0.045)

## 2016-05-28 MED ORDER — PNEUMOCOCCAL 13-VAL CONJ VACCINE-DIP CRM (PF) 0.5 ML IM SYRINGE
0.5 mL | INTRAMUSCULAR | Status: DC
Start: 2016-05-28 — End: 2016-05-29

## 2016-05-28 MED ORDER — WARFARIN 2.5 MG TAB
2.5 mg | Freq: Once | ORAL | Status: AC
Start: 2016-05-28 — End: 2016-05-28

## 2016-05-28 MED FILL — BD POSIFLUSH NORMAL SALINE 0.9 % INJECTION SYRINGE: INTRAMUSCULAR | Qty: 20

## 2016-05-28 MED FILL — BD POSIFLUSH NORMAL SALINE 0.9 % INJECTION SYRINGE: INTRAMUSCULAR | Qty: 10

## 2016-05-28 MED FILL — SERTRALINE 50 MG TAB: 50 mg | ORAL | Qty: 3

## 2016-05-28 MED FILL — MYCOPHENOLIC ACID 180 MG TAB, DELAYED RELEASE: 180 mg | ORAL | Qty: 2

## 2016-05-28 MED FILL — LEVETIRACETAM 500 MG TAB: 500 mg | ORAL | Qty: 1

## 2016-05-28 MED FILL — SANDIMMUNE 25 MG CAPSULE: 25 mg | ORAL | Qty: 3

## 2016-05-28 MED FILL — COUMADIN 2.5 MG TABLET: 2.5 mg | ORAL | Qty: 1

## 2016-05-28 MED FILL — ONDANSETRON (PF) 4 MG/2 ML INJECTION: 4 mg/2 mL | INTRAMUSCULAR | Qty: 2

## 2016-05-28 MED FILL — ACETAMINOPHEN 325 MG TABLET: 325 mg | ORAL | Qty: 2

## 2016-05-28 NOTE — Progress Notes (Signed)
In Patient Progress note      Admit Date: 05/27/2016          Impression:       1.  Renal transplant - patient with [facility].  His Cr is 2.1mg /dl again today, same as it was yesterday -> this apparently is his baseline.  He is on stable immunosuppression with cyclosporine and myfortic.  The doses were verified with his transplant clinic by phone yesterday.      2.  ? Seizures - neuro note reviewed.  EEG was just completed this am.  MRI ordered per neuro recs but not yet done.  CT scan was negative.  No recurrent seizure activity noted.    3.  HTN - Patient just on low dose norvasc for HTN.  BP's are mostly running high (both systolic and diastolic) but apparently he's had a lot of issues with orthostatic hypotension.       Plan:     Continue usual transplant immunosuppression -> no need to make any changes in doses.    Cyclosporine level sent but this may take a couple of days to come back.    I'm not going to change his BP meds given h/o severe orthostasis.    Follow up results of EEG and MRI.    Per wife's request, we are attempting [facility].  Hopefully we can make this transfer happen as wife has many issues with the hospital/staff as already documented.      I will follow but seems quite stable as far as this transplant kidney goes.      Call me with questions.      Subjective:     EEG just completed.             Current Facility-Administered Medications:   ???  pneumococcal 13 val conj dip (PREVNAR-13) injection 0.5 mL, 0.5 mL, IntraMUSCular, PRIOR TO DISCHARGE, Roderic ScarceSourabh Verma, MD  ???  warfarin (COUMADIN) tablet 2.5 mg, 2.5 mg, Oral, ONCE, Roderic ScarceSourabh Verma, MD  ???  amLODIPine (NORVASC) tablet 5 mg, 5 mg, Oral, DAILY, Roderic ScarceSourabh Verma, MD  ???  cycloSPORINE (SandIMMUNE) capsule 175 mg, 175 mg, Oral, BID, Roderic ScarceSourabh Verma, MD, 175 mg at 05/28/16 0806  ???  levETIRAcetam (KEPPRA) tablet 500 mg, 500 mg, Oral, BID, Roderic ScarceSourabh Verma, MD, 500 mg at 05/28/16 0810   ???  mycophenolic acid (MYFORTIC) 180 mg tab, delayed release, 360 mg, Oral, BID, Roderic ScarceSourabh Verma, MD, 360 mg at 05/28/16 [id number]  ???  omeprazole (PRILOSEC) capsule 20 mg, 20 mg, Oral, DAILY, Roderic ScarceSourabh Verma, MD  ???  pravastatin (PRAVACHOL) tablet 10 mg, 10 mg, Oral, QHS, Roderic ScarceSourabh Verma, MD, Stopped at 05/27/16 2200  ???  sertraline (ZOLOFT) tablet 150 mg, 150 mg, Oral, DAILY, Roderic ScarceSourabh Verma, MD, 150 mg at 05/28/16 [id number]  ???  sodium chloride (NS) flush 5-10 mL, 5-10 mL, IntraVENous, Q8H, Roderic ScarceSourabh Verma, MD, 10 mL at 05/28/16 0608  ???  sodium chloride (NS) flush 5-10 mL, 5-10 mL, IntraVENous, PRN, Roderic ScarceSourabh Verma, MD  ???  naloxone (NARCAN) injection 0.1 mg, 0.1 mg, IntraVENous, PRN, Roderic ScarceSourabh Verma, MD  ???  acetaminophen (TYLENOL) tablet 650 mg, 650 mg, Oral, Q6H PRN, Roderic ScarceSourabh Verma, MD, 650 mg at 05/27/16 2242  ???  HYDROcodone-acetaminophen (NORCO) 5-325 mg per tablet 1 Tab, 1 Tab, Oral, Q4H PRN, Roderic ScarceSourabh Verma, MD, 1 Tab at 05/27/16 1521  ???  alum-mag hydroxide-simeth (MYLANTA) oral suspension 30 mL, 30 mL, Oral, Q4H PRN, Roderic ScarceSourabh Verma,  MD  ???  albuterol (PROVENTIL VENTOLIN) nebulizer solution 2.5 mg, 2.5 mg, Nebulization, Q6H PRN, Roderic Scarce, MD  ???  *Pharmacy Dosing Warfarin, 1 Each, Other, Rx Dosing/Monitoring, Roderic Scarce, MD  ???  ondansetron (ZOFRAN) injection 4 mg, 4 mg, IntraVENous, Q6H PRN, Roderic Scarce, MD, 4 mg at 05/28/16 2130        Objective:     Visit Vitals   ??? BP (!) 166/104   ??? Pulse 79   ??? Temp 98.1 ??F (36.7 ??C)   ??? Resp 17   ??? Ht 5\' 9"  (1.753 m)   ??? Wt 69.9 kg (154 lb)   ??? SpO2 100%   ??? BMI 22.74 kg/m2         Intake/Output Summary (Last 24 hours) at 05/28/16 1058  Last data filed at 05/28/16 0900   Gross per 24 hour   Intake              120 ml   Output              425 ml   Net             -305 ml       Physical Exam:     Awake, looks well.  NAD.  Clear lungs.  RRR.  No pitting edema.  No rash.      Data Review:    Recent Labs      05/28/16   0400   WBC  7.0   RBC  5.21   HCT  40.2   MCV  77.2*   MCH  24.6    MCHC  31.8     Recent Labs      05/28/16   0400  05/27/16   1145   BUN  41*  42*   CREA  2.1*  2.1*   CA  9.3  10.1   K  4.5  4.4   NA  134*  136   CL  103  104   CO2  22  22   GLU  87  165*       Vernard Gambles, MD  Cell [phone]  Pager [phone]

## 2016-05-28 NOTE — Progress Notes (Signed)
EEG completed.      Loida M. Tan, EEG Tech

## 2016-05-28 NOTE — Other (Signed)
Bedside and Verbal shift change report given to Gerri, Charity fundraiserN (Cabin crewoncoming nurse) by Eunice BlaseMila, RN (offgoing nurse). Report included the following information SBAR, Kardex, Intake/Output, MAR and Recent Results.     Received patient resting in bed, no complaints, on seizure precautions. EMTALA forms attached to chart, awaiting bed and accepting MD.

## 2016-05-28 NOTE — Progress Notes (Signed)
PAGER ID: [phone]   MESSAGE: Hello, CM has paper work to be signed by md [facility] [name] [name]. If we can have papers signed to be faxed over before 1500 so they can work on acceptance. Thank CM [phone]

## 2016-05-28 NOTE — Progress Notes (Addendum)
Updated note 05/21 at 1505. CM faxed [facility] transfer signed [place] [facility] she will forward to the MICU doctor to discuss with our doctor for transfer. CM provided Lawson FiscalLori with the nursing unit contact [phone] for completion of transfer.     Updated note 5/21 at 1310 CM spoke with Lawson FiscalLori and they will cover the transport grounds not air (they do not have access to air) to [facility] once all information is completed and accepted and physician accepted. CM to fax clinical to Lawson FiscalLori before 4p fax# [phone] and after 4pm fax to [facility] [phone]. She informed she will fax over the transfer packet that needs to be completed by md and patient.     Updated note: 05/21 11:38am CM received voicemail from [facility] [phone] received voicemail return call back information left.     CM received a message from nurse and charge nurse of patient spouse wanting to speak with CM. CM went to patient room and introduced self to both patient and patient spoue [employer]Jeneane Morici. Spouse is requesting for patient to be [facility] [facility] [place] as she stated he has a team of doctors that knows his care and wants to have continuity of care as she voiced [facility] is unable to provide the care and states she does not feel driving him is safe with his seizures. Patient spouse provided contact for PA with the [facility] (nephrology) [phone]. CM attempt to call and was transferred to voicemail-CM left a voicemail with return call back information.

## 2016-05-28 NOTE — Other (Signed)
Bedside and Verbal shift change report given to Geraldine, RN (oncoming nurse) by Milagros M Jimenez, RN   (offgoing nurse). Report included the following information SBAR, Kardex and Recent Results.

## 2016-05-28 NOTE — Progress Notes (Signed)
PAGER ID: [phone]   MESSAGE: Dr. Saunders GlanceDonnell fyi doctor [facility] will call you to discuss Alaimo CD14. Thank you CM [phone]

## 2016-05-28 NOTE — Procedures (Signed)
[facility]  Electroencephalogram  NAME:  [name] [name], [name] [name]   DATE: 05/28/2016  EEG#: [id number]  DOB: 12/11/1955  MR#    [id number]  ROOM:  3CDU  ACCT#  [id number]  SEX: M  REFERRING PHYSICIAN: BRIAN F ODONNELL          HISTORY:  A 60-year-old patient with a seizure disorder who is presenting with breakthrough seizures.    TECHNICAL DESCRIPTION:  An 18-channel digital EEG was performed on this patient according to 10/20 international system of electrode placement for a total duration of 26 minutes.  Background rhythm was faster beta frequency activities when the patient was awake.  Hyperventilation was not performed.  Photic stimulation was performed and did not elicit a well-developed driving response.  During this recording, the patient became drowsy and went into deeper stages of sleep during which sleep spindles and K complexes were present symmetrically.    IMPRESSION:  Normal awake and asleep electroencephalogram.  There were no epileptiform discharges during this recording.      ___________________  Ellana Kawa MD  Dictated By: .   zga  D:05/28/2016 11:13:06  T: 05/28/2016 13:46:58  [id number]

## 2016-05-28 NOTE — Progress Notes (Signed)
PAGER ID: [phone]   MESSAGE: CD14 Dellis, Kerin Ransomouglas(Sz) pt is for a mri this am, so could we get an off tele order for him. He has been SR no cp/sob/sz. vitals stable. please call [phone] or place in order management. Thanks. Archie Pattenonya RN

## 2016-05-28 NOTE — Discharge Summary (Addendum)
[facility]  Transfer Summary   NAME:  [name], [name]  SEX:   M  ADMIT: 05/27/2016  DISCH:   DOB:   1955/01/24  MR#    [id number]  ACCT#  [id number]        DATE OF ADMISSION:    05-27-16     DATE OF DISCHARGE:    05-28-16    Please refer to admission history and physical by Dr. Corinda GublerVerma as well as consultation by Drs. [name] FellersQazizadeh (Neurology) and [name] [name] (Nephrology) for pertinent past medical problems as well as details leading to this hospital stay.    Briefly, the patient is a 61 year old male with multiple medical problems as noted below.  Of most significance is that he does have  history of seizure and is on Keppra 500 mg b.i.d.  Also of significance is that he has a history of chronic atrial fibrillation and end-stage renal disease, and he is status post renal [facility], [facility] and is on chronic immunosuppressive therapy.  Apparently his baseline creatinine is around 2.  He presented to the Emergency Department after having a probable seizure.  The patient was amnesic for the event; wife notes that patient was at a breakfast bar at a hotel when he became nonresponsive, he fell, and had posturing and clenching behavior which she felt was typical for his seizure.  He was postictal after the event.  There was no tongue biting.  There was no loss of bladder or bowel.  He was brought to the Emergency Department and upon presentation had temperature 98.2, blood pressure 166/112, pulse 97, O2 sat was 100%.  Formal neurologic exam per Dr [name] CaulQazizadeh's note.  His initial laboratory studies showed WBC 6.1, hemoglobin 14.2, hematocrit 45.7, platelet 168.  Urinalysis was unremarkable.  INR 2.2 (on Coumadin for atrial fibrillation).  Head CT showed no acute intracranial abnormality.  He was admitted for further evaluation of recurrent seizure.    He was seen in consultation by Dr. Lenord FellersQazizadeh from Neurology.  He  recommended MRI which is pending and an EEG which was normal.  See his consultation for details.  Dr. Lenord FellersQazizadeh also suggested to consider changing to a different anti-seizure medicine such as Depakote or Vimpat.  He was also seen by Dr. Rondel Jumbourley and associates from Nephrology while here.    The patient [facility] most of [facility] healthcare [facility] [place].  He has a neurologist who works there.  He has currently been stabilized.  He and family request transport [facility] for further evaluation and care.  He is currently medically stable for this.  We are attempting to make arrangements [facility].    Again, Dr. Lenord FellersQazizadeh did recommend an MRI.  If patient is still here, we will get it here;   otherwise, this can be done [facility].  We did not make any changes in medication, although Dr. Lenord FellersQazizadeh does recommend changing the Keppra to either Vimpat or Depakote.    He is currently medically stable for this transport.    DISCHARGE DIAGNOSES:  1.  Probable seizure with history of seizure disorder.  2.  History of end-stage renal disease, status post renal transplant April 2017.  3.  Hypertension.  4.  Dyslipidemia.  5.  Chronic atrial fibrillation.  6.  History of orthostatic hypotension.  7.  Depression.    MEDICATIONS ON DISCHARGE:  Norvasc 5 mg daily, Calcitriol 0.25 mcg daily,  cyclosporine 175 mg b.i.d., vitamin D 50,000 units once a week, Keppra 500 mg b.i.d., Imodium 2 mg p.r.n., Myfortic 360 mg b.i.d., Prilosec 20 mg daily, Pravachol 10 mg daily, Zoloft 150 mg daily,  Coumadin 2.5 mg on Tuesdays and 5 mg on the other days.    Regular diet.    Followup will be per physicians at [facility] recommendation.    Total time to coordinate discharge was 35 minutes.    ADDENDUM 05/29/16 @ 1130: We have been awaiting a bed at the [facility]. He has remained afebrile. Stable vital signs. He feels  well and voices no complaints. His EEG was unremarkable. We are unable to obtain an MRI. He's showed no recurrence of seizure. He remains on Keppra. At this point the patient is medically stable. Upon discussion with family, they're comfortable him being discharged from our facility today, he'll drive back to [facility] and follow-up as an outpatient with a [facility].    ___________________  Charisse Klinefelter MD  Dictated By: .   MLT  D:05/28/2016 14:44:08  T: 05/28/2016 15:17:03  [id number]

## 2016-05-28 NOTE — Progress Notes (Signed)
[facility] Dosing [facility]: Warfarin    Consult for Warfarin Dosing by Pharmacy by Dr. Corinda GublerVerma  Consult provided for this 61 y.o.  male for indication of atrial fib  Dose to achieve an INR goal of 2-3    Order entered for  Warfarin  2.5 (mg)  to be given today at 18:00.     Significant drug interactions:   Previous dose given 5 mg on 5/20   PT/INR Lab Results   Component Value Date/Time    INR 2.7 (H) 05/28/2016 04:00 AM      Platelets Lab Results   Component Value Date/Time    PLATELET 162 05/28/2016 04:00 AM      H/H Lab Results   Component Value Date/Time    HGB 12.8 05/28/2016 04:00 AM        INR up to 2.7 today from 2.2 yesterday  Will reduce Warfarin dose to 2.5 mg for this pm  Pharmacy to follow daily and will provide subsequent Warfarin dosing based on clinical status.  Rosalin HawkingWilliam Curtis CrawfordScott Jr., [facility])  Contact information [phone]

## 2016-05-28 NOTE — Discharge Summary (Signed)
[facility]  Transfer Summary   NAME:  [name] [name], [name] [name]  SEX:   M  ADMIT: 05/27/2016  DISCH:   DOB:   04-11-1955  MR#    [id number]  ACCT#  [id number]        DATE OF ADMISSION:    05-27-16     DATE OF DISCHARGE:    05-28-16    Please refer to admission history and physical by Dr. Corinda Gubler as well as consultation by Drs. Lenord Fellers (Neurology) and [facility] (Nephrology) for pertinent past medical problems as well as details leading to this hospital stay.    Briefly, the patient is a 61 year old male with multiple medical problems as noted below.  Of most significance is that he does have  history of seizure and is on Keppra 500 mg b.i.d.  Also of significance is that he has a history of chronic atrial fibrillation and end-stage renal disease, and he is status post renal transplant at the [facility] in April, 2017 and is on chronic immunosuppressive therapy.  Apparently his baseline creatinine is around 2.  He presented to the Emergency Department after having a probable seizure.  The patient was amnesic for the event; wife notes that patient was at a breakfast bar at a hotel when he became nonresponsive, he fell, and had posturing and clenching behavior which she felt was typical for his seizure.  He was postictal after the event.  There was no tongue biting.  There was no loss of bladder or bowel.  He was brought to the Emergency Department and upon presentation had temperature 98.2, blood pressure 166/112, pulse 97, O2 sat was 100%.  Formal neurologic exam per Dr Letitia Caul note.  His initial laboratory studies showed WBC 6.1, hemoglobin 14.2, hematocrit 45.7, platelet 168.  Urinalysis was unremarkable.  INR 2.2 (on Coumadin for atrial fibrillation).  Head CT showed no acute intracranial abnormality.  He was admitted for further evaluation of recurrent seizure.    He was seen in consultation by Dr. Lenord Fellers from Neurology.  He recommended MRI which is pending and an EEG which was normal.  See  his consultation for details.  Dr. Lenord Fellers also suggested to consider changing to a different anti-seizure medicine such as Depakote or Vimpat.  He was also seen by Dr. Rondel Jumbo and associates from Nephrology while here.    The patient gets most of his healthcare through the [facility] in [place], [place].  He has a neurologist who works there.  He has currently been stabilized.  He and family request transport back to [facility] [address] [facility] [address] for further evaluation and care.  He is currently medically stable for this.  We are attempting to make arrangements with the [facility].    Again, Dr. Lenord Fellers did recommend an MRI.  If patient is still here, we will get it here;   otherwise, this can be done at [facility].  We did not make any changes in medication, although Dr. Lenord Fellers does recommend changing the Keppra to either Vimpat or Depakote.    He is currently medically stable for this transport.    DISCHARGE DIAGNOSES:  1.  Probable seizure with history of seizure disorder.  2.  History of end-stage renal disease, status post renal transplant April 2017.  3.  Hypertension.  4.  Dyslipidemia.  5.  Chronic atrial fibrillation.  6.  History of orthostatic hypotension.  7.  Depression.    MEDICATIONS ON DISCHARGE:  Norvasc 5 mg daily, Calcitriol 0.25 mcg daily,  cyclosporine 175 mg b.i.d., vitamin D 50,000 units once a week, Keppra 500 mg b.i.d., Imodium 2 mg p.r.n., Myfortic 360 mg b.i.d., Prilosec 20 mg daily, Pravachol 10 mg daily, Zoloft 150 mg daily,  Coumadin 2.5 mg on Tuesdays and 5 mg on the other days.    Regular diet.    Followup will be per physicians at [facility]'s recommendation.    Total time to coordinate discharge was 35 minutes.    ADDENDUM 05/29/16 @ 1130: We have been awaiting a bed at [facility]. He has remained afebrile. Stable vital signs. He feels well and voices no complaints. His EEG was unremarkable. We are unable to obtain an MRI. He's showed no  recurrence of seizure. He remains on Keppra. At this point the patient is medically stable. Upon discussion with family, they're comfortable him being discharged from our facility today, [place] [facility] and [facility] an [facility].    ___________________  Charisse KlinefelterBrian F Jaelynn Pozo MD  Dictated By: .   MLT  D:05/28/2016 14:44:08  T: 05/28/2016 15:17:03  [id number]

## 2016-05-28 NOTE — Procedures (Signed)
[facility]  Electroencephalogram  NAME:  [name] [name], [name] [name]   DATE: 05/28/2016  EEG#: [id number]  DOB: [birth year]  MR#    [id number]  ROOM:  3CDU  ACCT#  [id number]  SEX: M  REFERRING PHYSICIAN: Apolonio SchneidersBRIAN F ODONNELL          HISTORY:  A 61 year old patient with a seizure disorder who is presenting with breakthrough seizures.    TECHNICAL DESCRIPTION:  An 18-channel digital EEG was performed on this patient according to 10/20 international system of electrode placement for a total duration of 26 minutes.  Background rhythm was faster beta frequency activities when the patient was awake.  Hyperventilation was not performed.  Photic stimulation was performed and did not elicit a well-developed driving response.  During this recording, the patient became drowsy and went into deeper stages of sleep during which sleep spindles and K complexes were present symmetrically.    IMPRESSION:  Normal awake and asleep electroencephalogram.  There were no epileptiform discharges during this recording.      ___________________  Charlesetta IvorySalim Sheza Strickland MD  Dictated By: .   zga  D:05/28/2016 11:13:06  T: 05/28/2016 13:46:58  [id number]

## 2016-05-29 LAB — CYCLOSPORINE: CYCLOSPORINE: 237 ng/ml

## 2016-05-29 MED ORDER — AMLODIPINE 5 MG TAB
5 mg | Freq: Every day | ORAL | Status: DC
Start: 2016-05-29 — End: 2016-05-29

## 2016-05-29 MED FILL — SANDIMMUNE 25 MG CAPSULE: 25 mg | ORAL | Qty: 3

## 2016-05-29 MED FILL — LEVETIRACETAM 500 MG TAB: 500 mg | ORAL | Qty: 1

## 2016-05-29 MED FILL — AMLODIPINE 5 MG TAB: 5 mg | ORAL | Qty: 1

## 2016-05-29 MED FILL — SERTRALINE 50 MG TAB: 50 mg | ORAL | Qty: 3

## 2016-05-29 MED FILL — MYCOPHENOLIC ACID 180 MG TAB, DELAYED RELEASE: 180 mg | ORAL | Qty: 2

## 2016-05-29 MED FILL — OMEPRAZOLE 20 MG CAP, DELAYED RELEASE: 20 mg | ORAL | Qty: 1

## 2016-05-29 MED FILL — BD POSIFLUSH NORMAL SALINE 0.9 % INJECTION SYRINGE: INTRAMUSCULAR | Qty: 10

## 2016-05-29 MED FILL — HYDROCODONE-ACETAMINOPHEN 5 MG-325 MG TAB: 5-325 mg | ORAL | Qty: 1

## 2016-05-29 MED FILL — ACETAMINOPHEN 325 MG TABLET: 325 mg | ORAL | Qty: 2

## 2016-05-29 MED FILL — PRAVASTATIN 10 MG TAB: 10 mg | ORAL | Qty: 1

## 2016-05-29 NOTE — Progress Notes (Signed)
It appears [facility] will likely [facility] today.    No new labs in our computer system.  I looked to see if cyclosporine level was back -> this remains pending.    I will write for labs in the am in the event that the patient is not transferred today as planned.

## 2016-05-29 NOTE — Progress Notes (Signed)
Problem: Falls - Risk of  Goal: *Absence of Falls  Document Schmid Fall Risk and appropriate interventions in the flowsheet.   Outcome: Progressing Towards Goal  Fall Risk Interventions:  Mobility Interventions: Bed/chair exit alarm, Communicate number of staff needed for ambulation/transfer, Patient to call before getting OOB         Medication Interventions: Bed/chair exit alarm, Patient to call before getting OOB, Teach patient to arise slowly    Elimination Interventions: Bed/chair exit alarm, Call light in reach, Patient to call for help with toileting needs    History of Falls Interventions: Bed/chair exit alarm, Consult care management for discharge planning, Evaluate medications/consider consulting pharmacy

## 2016-05-29 NOTE — Other (Addendum)
----------  DocumentID: [id number]------------------------------------------------              [facility]                       Patient Education Report         Name: [name] [name], [name] [name]                  Date: 05/27/2016    MRN: [id number]                    Time: 3:27:31 PM         Patient ordered video: 'Patient Safety: Stay Safe While you are in the Hospital'    from 3CDU_CD14_1 via phone number: [phone] at 3:27:31 PM    Description: This program outlines some of the precautions patients can take to ensure a speedy recovery without extra complications. The video emphasizes the importance of communicating with the healthcare team.    ----------DocumentID: [id number]------------------------------------------------                       [facility]          Patient Education Report - Discharge Summary        Date: 05/29/2016   Time: 11:54:42 AM   Name: [name] [name], [name] [name]   MRN: [id number]      Account Number: [id number]      Education History:        Patient ordered video: 'Patient Safety: Stay Safe While you are in the Hospital' from 3CDU_CD14_1 on 05/27/2016 03:27:31 PM

## 2016-05-29 NOTE — Progress Notes (Signed)
CM received call from [facility]ori [facility]-there is an accepting physician awaiting a medical bed-Lori will call the nurse station [phone] once they have a bed.

## 2016-05-29 NOTE — Progress Notes (Signed)
Discharge Plan:  Home-family has decided to go home versus [facility] [place]. [facility] with the [facility] is informed also of discharge today.     Discharge Date:     05/29/2016     Assisted Living Facility:    no    FOC given :     The facility confirmed that they are ready to receive the patient:     Yes/No; the CM spoke with     no    Does the patient have  an insurance company assigned cm   Yes/NO    Spoke with case manager  No    Collaborative dc plan  no    Is patient going to snf? no name  no    Wound vac needed at time of dc to snf  no    BIPAP needed at snf  no    Any other equipment needs at snf? no    Home Health Needed:      no    Home Health Agency:    no      Confirmed start of care with the home health agency and spoke with:      no    DME needed and ordered for Discharge:    no bipap/cpap  no wound vac no    DME Company:    no    CM confirmed delivery of DME: with      no    TCC Referral:    no    Medication Assistance given     Y/N       meds given (please list)     no    Change(MDC/SSDI) Referral/outcome    no     Transportation: [facility]/ Medical    Family    Transport Time:    anytime    Family, MD, patient, nurse and facility aware of pickup time    yes

## 2016-05-31 LAB — LEVETIRACETAM (KEPPRA): LEVETIRACETAM, S: 25.8 ug/mL

## 2016-06-03 LAB — EKG, 12 LEAD, INITIAL
Atrial Rate: 91 {beats}/min
Calculated P Axis: 65 degrees
Calculated R Axis: -81 degrees
Calculated T Axis: 69 degrees
P-R Interval: 150 ms
Q-T Interval: 380 ms
QRS Duration: 82 ms
QTC Calculation (Bezet): 467 ms
Ventricular Rate: 91 {beats}/min

## 2016-06-03 LAB — EKG 12-LEAD
Atrial Rate: 91 {beats}/min
P Axis: 65 degrees
P-R Interval: 150 ms
Q-T Interval: 380 ms
QRS Duration: 82 ms
QTc Calculation (Bazett): 467 ms
R Axis: -81 degrees
T Axis: 69 degrees
Ventricular Rate: 91 {beats}/min

## 2020-09-30 ENCOUNTER — Emergency Department: Payer: No Typology Code available for payment source

## 2020-09-30 ENCOUNTER — Emergency Department
Admission: EM | Admit: 2020-09-30 | Discharge: 2020-09-30 | Disposition: A | Discharge status: Skilled Nursing Facility | Payer: No Typology Code available for payment source | Attending: Emergency Medicine | Admitting: Emergency Medicine

## 2020-09-30 ENCOUNTER — Encounter: Payer: Self-pay | Admitting: Emergency Medicine

## 2020-09-30 ENCOUNTER — Other Ambulatory Visit: Payer: Self-pay

## 2020-09-30 DIAGNOSIS — R531 Weakness: Secondary | ICD-10-CM | POA: Insufficient documentation

## 2020-09-30 DIAGNOSIS — Z7901 Long term (current) use of anticoagulants: Secondary | ICD-10-CM | POA: Insufficient documentation

## 2020-09-30 DIAGNOSIS — I4891 Unspecified atrial fibrillation: Secondary | ICD-10-CM | POA: Diagnosis not present

## 2020-09-30 DIAGNOSIS — R42 Dizziness and giddiness: Secondary | ICD-10-CM | POA: Diagnosis not present

## 2020-09-30 DIAGNOSIS — Z940 Kidney transplant status: Secondary | ICD-10-CM | POA: Insufficient documentation

## 2020-09-30 DIAGNOSIS — F0390 Unspecified dementia without behavioral disturbance: Secondary | ICD-10-CM | POA: Diagnosis not present

## 2020-09-30 DIAGNOSIS — E119 Type 2 diabetes mellitus without complications: Secondary | ICD-10-CM | POA: Diagnosis not present

## 2020-09-30 DIAGNOSIS — Z79899 Other long term (current) drug therapy: Secondary | ICD-10-CM | POA: Insufficient documentation

## 2020-09-30 DIAGNOSIS — I5030 Unspecified diastolic (congestive) heart failure: Secondary | ICD-10-CM | POA: Diagnosis not present

## 2020-09-30 DIAGNOSIS — I110 Hypertensive heart disease with heart failure: Secondary | ICD-10-CM | POA: Diagnosis not present

## 2020-09-30 DIAGNOSIS — R55 Syncope and collapse: Secondary | ICD-10-CM

## 2020-09-30 DIAGNOSIS — R2243 Localized swelling, mass and lump, lower limb, bilateral: Secondary | ICD-10-CM | POA: Diagnosis not present

## 2020-09-30 HISTORY — DX: Unspecified atrial fibrillation: I48.91

## 2020-09-30 HISTORY — DX: Vascular dementia, unspecified severity, without behavioral disturbance, psychotic disturbance, mood disturbance, and anxiety: F01.50

## 2020-09-30 HISTORY — DX: Essential (primary) hypertension: I10

## 2020-09-30 HISTORY — DX: Hyperlipidemia, unspecified: E78.5

## 2020-09-30 HISTORY — DX: Kidney transplant status: Z94.0

## 2020-09-30 HISTORY — DX: Type 2 diabetes mellitus without complications: E11.9

## 2020-09-30 LAB — URINALYSIS, COMPLETE (UACMP) WITH MICROSCOPIC
Bacteria, UA: NONE SEEN
Bilirubin Urine: NEGATIVE
Glucose, UA: NEGATIVE mg/dL
Hgb urine dipstick: NEGATIVE
Ketones, ur: NEGATIVE mg/dL
Leukocytes,Ua: NEGATIVE
Nitrite: NEGATIVE
Protein, ur: 30 mg/dL — AB
Specific Gravity, Urine: 1.014 (ref 1.005–1.030)
pH: 5 (ref 5.0–8.0)

## 2020-09-30 LAB — CBC WITH DIFFERENTIAL/PLATELET
Abs Immature Granulocytes: 0.02 10*3/uL (ref 0.00–0.07)
Basophils Absolute: 0 10*3/uL (ref 0.0–0.1)
Basophils Relative: 0 %
Eosinophils Absolute: 0.1 10*3/uL (ref 0.0–0.5)
Eosinophils Relative: 2 %
HCT: 25.4 % — ABNORMAL LOW (ref 39.0–52.0)
Hemoglobin: 8.2 g/dL — ABNORMAL LOW (ref 13.0–17.0)
Immature Granulocytes: 0 %
Lymphocytes Relative: 8 %
Lymphs Abs: 0.4 10*3/uL — ABNORMAL LOW (ref 0.7–4.0)
MCH: 23.9 pg — ABNORMAL LOW (ref 26.0–34.0)
MCHC: 32.3 g/dL (ref 30.0–36.0)
MCV: 74.1 fL — ABNORMAL LOW (ref 80.0–100.0)
Monocytes Absolute: 0.2 10*3/uL (ref 0.1–1.0)
Monocytes Relative: 5 %
Neutro Abs: 4 10*3/uL (ref 1.7–7.7)
Neutrophils Relative %: 85 %
Platelets: 216 10*3/uL (ref 150–400)
RBC: 3.43 MIL/uL — ABNORMAL LOW (ref 4.22–5.81)
RDW: 21.7 % — ABNORMAL HIGH (ref 11.5–15.5)
Smear Review: NORMAL
WBC: 4.7 10*3/uL (ref 4.0–10.5)
nRBC: 0 % (ref 0.0–0.2)

## 2020-09-30 LAB — TROPONIN I (HIGH SENSITIVITY)
Troponin I (High Sensitivity): 13 ng/L (ref <18)
Troponin I (High Sensitivity): 12 ng/L (ref <18)

## 2020-09-30 LAB — COMPREHENSIVE METABOLIC PANEL
ALT: 7 U/L (ref 0–44)
AST: 8 U/L — ABNORMAL LOW (ref 15–41)
Albumin: 2.9 g/dL — ABNORMAL LOW (ref 3.5–5.0)
Alkaline Phosphatase: 69 U/L (ref 38–126)
Anion gap: 11 (ref 5–15)
BUN: 35 mg/dL — ABNORMAL HIGH (ref 8–23)
CO2: 25 mmol/L (ref 22–32)
Calcium: 8.6 mg/dL — ABNORMAL LOW (ref 8.9–10.3)
Chloride: 102 mmol/L (ref 98–111)
Creatinine, Ser: 2.69 mg/dL — ABNORMAL HIGH (ref 0.61–1.24)
GFR, Estimated: 26 mL/min — ABNORMAL LOW (ref >60)
Glucose, Bld: 134 mg/dL — ABNORMAL HIGH (ref 70–99)
Potassium: 4 mmol/L (ref 3.5–5.1)
Sodium: 138 mmol/L (ref 135–145)
Total Bilirubin: 1.2 mg/dL (ref 0.3–1.2)
Total Protein: 7.5 g/dL (ref 6.5–8.1)

## 2020-09-30 LAB — CBG MONITORING, ED: Glucose-Capillary: 127 mg/dL — ABNORMAL HIGH (ref 70–99)

## 2020-09-30 LAB — BRAIN NATRIURETIC PEPTIDE: B Natriuretic Peptide: 868.7 pg/mL — ABNORMAL HIGH (ref 0.0–100.0)

## 2020-09-30 MED ORDER — FUROSEMIDE 10 MG/ML IJ SOLN: 40.0000 mg | Freq: Once | INTRAMUSCULAR | Status: DC

## 2020-09-30 NOTE — ED Triage Notes (Signed)
Pt BIBA from [facility] for weakness and dizziness. Per EMS, facility stated he had stroke like symptoms. Per ems, none displayed for them other than his previous left sided defecits.

## 2020-09-30 NOTE — ED Notes (Signed)
Patient transported to CT 

## 2020-09-30 NOTE — ED Notes (Signed)
[facility]  CALLED  FOR  TRANSPORT  TO  [facility] 

## 2020-09-30 NOTE — ED Notes (Signed)
Pt resting, warm blankets provided, lights off, denies any needs at this time. Call bell in reach.

## 2020-09-30 NOTE — ED Provider Notes (Signed)
[facility] Emergency Department Provider Note   ____________________________________________   Event Date/Time   First MD Initiated Contact with Patient 09/30/20 1517     (approximate)  I have reviewed the triage vital signs and the nursing notes.   HISTORY  Chief Complaint Weakness    HPI Juandiego Kolenovic is a 65 y.o. male with past medical history of hypertension, hyperlipidemia, stroke, vascular dementia, atrial fibrillation, diastolic CHF, diabetes, and renal transplant who presents to the ED for weakness.  Patient states that he was being pushed in his wheelchair out to the [place] for some fresh air when "something came over me."  He describes of waves of weakness affecting his entire body, denies any focal weakness or numbness in any of his extremities.  Staff at nursing facility were concerned about possible facial droop as well as slurred speech.  Patient denies any chest pain or shortness of breath with this episode, does report that he felt lightheaded like he was going to pass out.  He now states the lightheadedness is improved, but he continues to feel weak.  He states he has been feeling well recently with no fevers, cough, shortness of breath, nausea, vomiting, abdominal pain, or diarrhea.  He has been making urine as usual and denies any dysuria.        Past Medical History:  Diagnosis Date   Atrial fibrillation ([place])    Diabetes ([place])    HTN (hypertension)    Hyperlipidemia    Renal transplant recipient    Vascular dementia ([place])     There are no problems to display for this patient.   Past Surgical History:  Procedure Laterality Date   KIDNEY TRANSPLANT      Prior to Admission medications   Medication Sig Start Date End Date Taking? Authorizing Provider  acetaminophen (TYLENOL) 325 MG tablet Take 2 tablets by mouth every 6 (six) hours as needed.   Yes [provider]  Alogliptin Benzoate 6.25 MG TABS Take 1 tablet by  mouth daily. 07/29/20  Yes [provider]  amLODipine (NORVASC) 10 MG tablet Take 10 mg by mouth daily. 07/29/20  Yes [provider]  atorvastatin (LIPITOR) 40 MG tablet Take 40 mg by mouth daily as needed. 07/29/20  Yes [provider]  carvedilol (COREG) 25 MG tablet Take 2 tablets by mouth 2 (two) times daily. 07/29/20  Yes [provider]  Cholecalciferol (VITAMIN D3) 50 MCG (2000 UT) TABS Take 1 tablet by mouth daily. 07/29/20  Yes [provider]  cycloSPORINE (SANDIMMUNE) 25 MG capsule Take 3 capsules by mouth 2 (two) times daily. 07/29/20  Yes [provider]  DULoxetine (CYMBALTA) 60 MG capsule Take 60 mg by mouth daily. 07/29/20  Yes [provider]  ELIQUIS 5 MG TABS tablet Take 5 mg by mouth 2 (two) times daily. 08/30/20  Yes [provider]  finasteride (PROSCAR) 5 MG tablet Take 5 mg by mouth daily. 07/29/20  Yes [provider]  Lacosamide 100 MG TABS Take 1 tablet by mouth every 12 (twelve) hours. 09/01/20  Yes [provider]  losartan (COZAAR) 100 MG tablet Take 100 mg by mouth daily. 07/29/20  Yes [provider]  mycophenolate (MYFORTIC) 180 MG EC tablet Take 180 mg by mouth 2 (two) times daily. 08/01/20  Yes [provider]  sodium bicarbonate 650 MG tablet Take 1 tablet by mouth 3 (three) times daily. 08/01/20  Yes [provider]  tamsulosin (FLOMAX) 0.4 MG CAPS capsule  Take 0.8 mg by mouth daily. 07/29/20  Yes [provider]    Allergies Baclofen, Lisinopril, Remeron [mirtazapine], Vancomycin, and Zofran [ondansetron]  No family history on file.  Social History    Review of Systems  Constitutional: No fever/chills.  Positive for generalized weakness and lightheadedness. Eyes: No visual changes. ENT: No sore throat. Cardiovascular: Denies chest pain. Respiratory: Denies shortness of breath. Gastrointestinal: No abdominal pain.  No nausea, no  vomiting.  No diarrhea.  No constipation. Genitourinary: Negative for dysuria. Musculoskeletal: Negative for back pain. Skin: Negative for rash. Neurological: Negative for headaches, focal weakness or numbness.  ____________________________________________   PHYSICAL EXAM:  VITAL SIGNS: ED Triage Vitals  Enc Vitals Group     BP      Pulse      Resp      Temp      Temp src      SpO2      Weight      Height      Head Circumference      Peak Flow      Pain Score      Pain Loc      Pain Edu?      Excl. in [place]?     Constitutional: Alert and oriented. Eyes: Conjunctivae are normal. Head: Atraumatic. Nose: No congestion/rhinnorhea. Mouth/Throat: Mucous membranes are moist. Neck: Normal ROM Cardiovascular: Normal rate, regular rhythm. Grossly normal heart sounds.  2+ radial pulses bilaterally, old left upper extremity AV fistula noted. Respiratory: Normal respiratory effort.  No retractions. Lungs CTAB. Gastrointestinal: Soft and nontender. No distention. Genitourinary: deferred Musculoskeletal: No lower extremity tenderness, 2+ pitting edema to knees bilaterally and mild edema noted in left upper extremity.  No tenderness noted in left upper extremity. Neurologic:  Normal speech and language. No gross focal neurologic deficits are appreciated. Skin:  Skin is warm, dry and intact. No rash noted. Psychiatric: Mood and affect are normal. Speech and behavior are normal.  ____________________________________________   LABS (all labs ordered are listed, but only abnormal results are displayed)  Labs Reviewed  CBC WITH DIFFERENTIAL/PLATELET - Abnormal; Notable for the following components:      Result Value   RBC 3.43 (*)    Hemoglobin 8.2 (*)    HCT 25.4 (*)    MCV 74.1 (*)    MCH 23.9 (*)    RDW 21.7 (*)    Lymphs Abs 0.4 (*)    All other components within normal limits  COMPREHENSIVE METABOLIC PANEL - Abnormal; Notable for the following components:   Glucose, Bld 134  (*)    BUN 35 (*)    Creatinine, Ser 2.69 (*)    Calcium 8.6 (*)    Albumin 2.9 (*)    AST 8 (*)    GFR, Estimated 26 (*)    All other components within normal limits  BRAIN NATRIURETIC PEPTIDE - Abnormal; Notable for the following components:   B Natriuretic Peptide 868.7 (*)    All other components within normal limits  CBG MONITORING, ED - Abnormal; Notable for the following components:   Glucose-Capillary 127 (*)    All other components within normal limits  URINALYSIS, COMPLETE (UACMP) WITH MICROSCOPIC  TROPONIN I (HIGH SENSITIVITY)  TROPONIN I (HIGH SENSITIVITY)   ____________________________________________  EKG  ED ECG REPORT I, Blake Divine, the attending physician, personally viewed and interpreted this ECG.   Date: 09/30/2020  EKG Time: 15:18  Rate: 66  Rhythm: normal sinus rhythm  Axis: Normal  Intervals:left anterior  fascicular block  ST&T Change: None   PROCEDURES  Procedure(s) performed (including Critical Care):  Procedures   ____________________________________________   INITIAL IMPRESSION / ASSESSMENT AND PLAN / ED COURSE      65 year old male with past medical history of hypertension, hyperlipidemia, diabetes, stroke, vascular dementia, diastolic CHF, atrial fibrillation, and renal transplant who presents to the ED following episode of acute generalized weakness and lightheadedness with near syncope.  Patient symptoms seem to be improving and while there was concern for facial droop and slurred speech by staff at his facility, he currently has no focal neurologic deficits on exam.  We will screen CT head, labs, chest x-ray, and BNP given his lower extremity edema.  Very low suspicion for stroke or TIA at this time.  We will check for infectious process with chest x-ray and UA.  CT head is negative for acute process, chest x-ray reviewed by me and shows very small pleural effusions, no focal infiltrate.  Labs remarkable for elevated creatinine as  well as anemia, but no evidence of acute bleeding at this time.  Findings discussed with patient's wife over the phone as he receives much of his care at the [place].  Creatinine of 2.6 is very similar to his baseline and wife prefers to hold off on any diuresis at this time.  She was advised to have him follow-up closely at the [facility] for recheck of labs and to return to the ED for new worsening symptoms.  On reassessment, patient states that he feels "excellent" and is requesting to be discharged home.      ____________________________________________   FINAL CLINICAL IMPRESSION(S) / ED DIAGNOSES  Final diagnoses:  Generalized weakness  Near syncope  Renal transplant recipient     ED Discharge Orders     None        Note:  This document was prepared using Dragon voice recognition software and may include unintentional dictation errors.    Blake Divine, MD 09/30/20 [phone]

## 2021-01-10 ENCOUNTER — Inpatient Hospital Stay
Admission: EM | Admit: 2021-01-10 | Discharge: 2021-01-19 | DRG: 100 | Disposition: A | Discharge status: Skilled Nursing Facility | Payer: No Typology Code available for payment source | Source: Skilled Nursing Facility | Attending: Internal Medicine | Admitting: Internal Medicine

## 2021-01-10 ENCOUNTER — Other Ambulatory Visit: Payer: Self-pay

## 2021-01-10 ENCOUNTER — Emergency Department: Payer: No Typology Code available for payment source

## 2021-01-10 DIAGNOSIS — J690 Pneumonitis due to inhalation of food and vomit: Secondary | ICD-10-CM | POA: Diagnosis not present

## 2021-01-10 DIAGNOSIS — I4819 Other persistent atrial fibrillation: Secondary | ICD-10-CM | POA: Diagnosis present

## 2021-01-10 DIAGNOSIS — Z20822 Contact with and (suspected) exposure to covid-19: Secondary | ICD-10-CM | POA: Diagnosis present

## 2021-01-10 DIAGNOSIS — T8619 Other complication of kidney transplant: Secondary | ICD-10-CM | POA: Diagnosis present

## 2021-01-10 DIAGNOSIS — D631 Anemia in chronic kidney disease: Secondary | ICD-10-CM | POA: Diagnosis present

## 2021-01-10 DIAGNOSIS — I10 Essential (primary) hypertension: Secondary | ICD-10-CM

## 2021-01-10 DIAGNOSIS — I1310 Hypertensive heart and chronic kidney disease without heart failure, with stage 1 through stage 4 chronic kidney disease, or unspecified chronic kidney disease: Secondary | ICD-10-CM | POA: Diagnosis present

## 2021-01-10 DIAGNOSIS — R569 Unspecified convulsions: Secondary | ICD-10-CM | POA: Diagnosis not present

## 2021-01-10 DIAGNOSIS — Z7901 Long term (current) use of anticoagulants: Secondary | ICD-10-CM

## 2021-01-10 DIAGNOSIS — E785 Hyperlipidemia, unspecified: Secondary | ICD-10-CM | POA: Diagnosis present

## 2021-01-10 DIAGNOSIS — R55 Syncope and collapse: Secondary | ICD-10-CM

## 2021-01-10 DIAGNOSIS — I69398 Other sequelae of cerebral infarction: Secondary | ICD-10-CM

## 2021-01-10 DIAGNOSIS — Z940 Kidney transplant status: Secondary | ICD-10-CM

## 2021-01-10 DIAGNOSIS — N401 Enlarged prostate with lower urinary tract symptoms: Secondary | ICD-10-CM | POA: Diagnosis present

## 2021-01-10 DIAGNOSIS — G9341 Metabolic encephalopathy: Secondary | ICD-10-CM | POA: Diagnosis present

## 2021-01-10 DIAGNOSIS — E1122 Type 2 diabetes mellitus with diabetic chronic kidney disease: Secondary | ICD-10-CM | POA: Diagnosis present

## 2021-01-10 DIAGNOSIS — N179 Acute kidney failure, unspecified: Secondary | ICD-10-CM

## 2021-01-10 DIAGNOSIS — B9689 Other specified bacterial agents as the cause of diseases classified elsewhere: Secondary | ICD-10-CM | POA: Diagnosis present

## 2021-01-10 DIAGNOSIS — F0154 Vascular dementia, unspecified severity, with anxiety: Secondary | ICD-10-CM | POA: Diagnosis present

## 2021-01-10 DIAGNOSIS — Y830 Surgical operation with transplant of whole organ as the cause of abnormal reaction of the patient, or of later complication, without mention of misadventure at the time of the procedure: Secondary | ICD-10-CM | POA: Diagnosis present

## 2021-01-10 DIAGNOSIS — I160 Hypertensive urgency: Secondary | ICD-10-CM | POA: Diagnosis not present

## 2021-01-10 DIAGNOSIS — F0153 Vascular dementia, unspecified severity, with mood disturbance: Secondary | ICD-10-CM | POA: Diagnosis present

## 2021-01-10 DIAGNOSIS — E538 Deficiency of other specified B group vitamins: Secondary | ICD-10-CM | POA: Diagnosis present

## 2021-01-10 DIAGNOSIS — N400 Enlarged prostate without lower urinary tract symptoms: Secondary | ICD-10-CM

## 2021-01-10 DIAGNOSIS — N390 Urinary tract infection, site not specified: Secondary | ICD-10-CM | POA: Diagnosis present

## 2021-01-10 DIAGNOSIS — N184 Chronic kidney disease, stage 4 (severe): Secondary | ICD-10-CM | POA: Diagnosis present

## 2021-01-10 DIAGNOSIS — I248 Other forms of acute ischemic heart disease: Secondary | ICD-10-CM | POA: Diagnosis present

## 2021-01-10 DIAGNOSIS — J9601 Acute respiratory failure with hypoxia: Secondary | ICD-10-CM | POA: Diagnosis not present

## 2021-01-10 DIAGNOSIS — J441 Chronic obstructive pulmonary disease with (acute) exacerbation: Secondary | ICD-10-CM | POA: Diagnosis present

## 2021-01-10 DIAGNOSIS — C9000 Multiple myeloma not having achieved remission: Secondary | ICD-10-CM | POA: Diagnosis present

## 2021-01-10 DIAGNOSIS — Z79624 Long term (current) use of inhibitors of nucleotide synthesis: Secondary | ICD-10-CM

## 2021-01-10 DIAGNOSIS — E872 Acidosis, unspecified: Secondary | ICD-10-CM | POA: Diagnosis not present

## 2021-01-10 DIAGNOSIS — N2581 Secondary hyperparathyroidism of renal origin: Secondary | ICD-10-CM | POA: Diagnosis present

## 2021-01-10 DIAGNOSIS — I2510 Atherosclerotic heart disease of native coronary artery without angina pectoris: Secondary | ICD-10-CM | POA: Diagnosis present

## 2021-01-10 DIAGNOSIS — F32A Depression, unspecified: Secondary | ICD-10-CM

## 2021-01-10 DIAGNOSIS — R338 Other retention of urine: Secondary | ICD-10-CM | POA: Diagnosis present

## 2021-01-10 DIAGNOSIS — B961 Klebsiella pneumoniae [K. pneumoniae] as the cause of diseases classified elsewhere: Secondary | ICD-10-CM | POA: Diagnosis present

## 2021-01-10 DIAGNOSIS — I509 Heart failure, unspecified: Secondary | ICD-10-CM | POA: Diagnosis present

## 2021-01-10 DIAGNOSIS — B1920 Unspecified viral hepatitis C without hepatic coma: Secondary | ICD-10-CM | POA: Diagnosis present

## 2021-01-10 DIAGNOSIS — Z79899 Other long term (current) drug therapy: Secondary | ICD-10-CM

## 2021-01-10 DIAGNOSIS — J440 Chronic obstructive pulmonary disease with acute lower respiratory infection: Secondary | ICD-10-CM | POA: Diagnosis not present

## 2021-01-10 LAB — CBC
HCT: 24.1 % — ABNORMAL LOW (ref 39.0–52.0)
Hemoglobin: 7.3 g/dL — ABNORMAL LOW (ref 13.0–17.0)
MCH: 23.2 pg — ABNORMAL LOW (ref 26.0–34.0)
MCHC: 30.3 g/dL (ref 30.0–36.0)
MCV: 76.8 fL — ABNORMAL LOW (ref 80.0–100.0)
Platelets: 156 10*3/uL (ref 150–400)
RBC: 3.14 MIL/uL — ABNORMAL LOW (ref 4.22–5.81)
RDW: 25.3 % — ABNORMAL HIGH (ref 11.5–15.5)
WBC: 5.5 10*3/uL (ref 4.0–10.5)
nRBC: 0 % (ref 0.0–0.2)

## 2021-01-10 LAB — COMPREHENSIVE METABOLIC PANEL
ALT: 7 U/L (ref 0–44)
AST: 16 U/L (ref 15–41)
Albumin: 2.8 g/dL — ABNORMAL LOW (ref 3.5–5.0)
Alkaline Phosphatase: 51 U/L (ref 38–126)
Anion gap: 10 (ref 5–15)
BUN: 73 mg/dL — ABNORMAL HIGH (ref 8–23)
CO2: 26 mmol/L (ref 22–32)
Calcium: 8.7 mg/dL — ABNORMAL LOW (ref 8.9–10.3)
Chloride: 103 mmol/L (ref 98–111)
Creatinine, Ser: 3.17 mg/dL — ABNORMAL HIGH (ref 0.61–1.24)
GFR, Estimated: 21 mL/min — ABNORMAL LOW (ref >60)
Glucose, Bld: 169 mg/dL — ABNORMAL HIGH (ref 70–99)
Potassium: 4.4 mmol/L (ref 3.5–5.1)
Sodium: 139 mmol/L (ref 135–145)
Total Bilirubin: 1 mg/dL (ref 0.3–1.2)
Total Protein: 7.7 g/dL (ref 6.5–8.1)

## 2021-01-10 LAB — TROPONIN I (HIGH SENSITIVITY)
Troponin I (High Sensitivity): 40 ng/L — ABNORMAL HIGH (ref <18)
Troponin I (High Sensitivity): 39 ng/L — ABNORMAL HIGH (ref <18)

## 2021-01-10 MED ORDER — CARVEDILOL 25 MG PO TABS
50.0000 mg | ORAL_TABLET | Freq: Two times a day (BID) | ORAL | Status: DC
  Administered 2021-01-12 – 2021-01-15 (×2): 50 mg via ORAL
  Filled 2021-01-10 (×4): qty 2

## 2021-01-10 MED ORDER — AMLODIPINE BESYLATE 5 MG PO TABS: 10.0000 mg | ORAL_TABLET | Freq: Every day | ORAL | Status: DC

## 2021-01-10 MED ORDER — ACETAMINOPHEN 500 MG PO TABS: 1000.0000 mg | ORAL_TABLET | Freq: Four times a day (QID) | ORAL | Status: AC | PRN

## 2021-01-10 MED ORDER — TAMSULOSIN HCL 0.4 MG PO CAPS
0.8000 mg | ORAL_CAPSULE | Freq: Every day | ORAL | Status: DC
  Administered 2021-01-12 – 2021-01-18 (×6): 0.8 mg via ORAL
  Filled 2021-01-10 (×7): qty 2

## 2021-01-10 MED ORDER — MYCOPHENOLATE SODIUM 180 MG PO TBEC
180.0000 mg | DELAYED_RELEASE_TABLET | Freq: Two times a day (BID) | ORAL | Status: DC
  Administered 2021-01-11 – 2021-01-19 (×14): 180 mg via ORAL
  Filled 2021-01-10 (×19): qty 1

## 2021-01-10 MED ORDER — INSULIN ASPART 100 UNIT/ML IJ SOLN
0.0000 [IU] | Freq: Every day | INTRAMUSCULAR | Status: DC
  Administered 2021-01-16 – 2021-01-18 (×3): 2 [IU] via SUBCUTANEOUS
  Filled 2021-01-10 (×3): qty 1

## 2021-01-10 MED ORDER — DULOXETINE HCL 30 MG PO CPEP
60.0000 mg | ORAL_CAPSULE | Freq: Every day | ORAL | Status: DC
  Administered 2021-01-12 – 2021-01-19 (×6): 60 mg via ORAL
  Filled 2021-01-10 (×3): qty 2
  Filled 2021-01-10: qty 1
  Filled 2021-01-10 (×2): qty 2

## 2021-01-10 MED ORDER — ONDANSETRON HCL 4 MG PO TABS: 4.0000 mg | ORAL_TABLET | Freq: Four times a day (QID) | ORAL | Status: DC | PRN

## 2021-01-10 MED ORDER — INSULIN ASPART 100 UNIT/ML IJ SOLN
0.0000 [IU] | Freq: Three times a day (TID) | INTRAMUSCULAR | Status: DC
  Administered 2021-01-11 – 2021-01-14 (×4): 1 [IU] via SUBCUTANEOUS
  Administered 2021-01-14 – 2021-01-15 (×3): 2 [IU] via SUBCUTANEOUS
  Administered 2021-01-15: 3 [IU] via SUBCUTANEOUS
  Administered 2021-01-16: 5 [IU] via SUBCUTANEOUS
  Administered 2021-01-16: 3 [IU] via SUBCUTANEOUS
  Administered 2021-01-16: 2 [IU] via SUBCUTANEOUS
  Administered 2021-01-17: 10:00:00 1 [IU] via SUBCUTANEOUS
  Administered 2021-01-17: 5 [IU] via SUBCUTANEOUS
  Administered 2021-01-17 – 2021-01-18 (×3): 2 [IU] via SUBCUTANEOUS
  Administered 2021-01-18: 17:00:00 5 [IU] via SUBCUTANEOUS
  Administered 2021-01-19 (×2): 1 [IU] via SUBCUTANEOUS
  Administered 2021-01-19: 3 [IU] via SUBCUTANEOUS
  Filled 2021-01-10 (×18): qty 1

## 2021-01-10 MED ORDER — CARVEDILOL 25 MG PO TABS: 50.0000 mg | ORAL_TABLET | Freq: Two times a day (BID) | ORAL | Status: DC

## 2021-01-10 MED ORDER — APIXABAN 5 MG PO TABS
5.0000 mg | ORAL_TABLET | Freq: Two times a day (BID) | ORAL | Status: DC
  Administered 2021-01-11 – 2021-01-19 (×14): 5 mg via ORAL
  Filled 2021-01-10 (×16): qty 1

## 2021-01-10 MED ORDER — LACOSAMIDE 50 MG PO TABS
100.0000 mg | ORAL_TABLET | Freq: Two times a day (BID) | ORAL | Status: DC
  Filled 2021-01-10: qty 2

## 2021-01-10 MED ORDER — ALOGLIPTIN BENZOATE 6.25 MG PO TABS: 1.0000 | ORAL_TABLET | Freq: Every day | ORAL | Status: DC

## 2021-01-10 MED ORDER — VITAMIN D 25 MCG (1000 UNIT) PO TABS
2000.0000 [IU] | ORAL_TABLET | Freq: Every day | ORAL | Status: DC
  Administered 2021-01-12 – 2021-01-19 (×6): 2000 [IU] via ORAL
  Filled 2021-01-10 (×6): qty 2

## 2021-01-10 MED ORDER — CYCLOSPORINE 25 MG PO CAPS
75.0000 mg | ORAL_CAPSULE | Freq: Two times a day (BID) | ORAL | Status: DC
  Administered 2021-01-11 – 2021-01-19 (×13): 75 mg via ORAL
  Filled 2021-01-10 (×15): qty 3

## 2021-01-10 MED ORDER — LORAZEPAM 2 MG/ML IJ SOLN
2.0000 mg | INTRAMUSCULAR | Status: AC | PRN
  Administered 2021-01-10 – 2021-01-13 (×3): 2 mg via INTRAVENOUS
  Filled 2021-01-10 (×3): qty 1

## 2021-01-10 MED ORDER — ONDANSETRON HCL 4 MG/2ML IJ SOLN: 4.0000 mg | Freq: Four times a day (QID) | INTRAMUSCULAR | Status: DC | PRN

## 2021-01-10 MED ORDER — ATORVASTATIN CALCIUM 20 MG PO TABS
40.0000 mg | ORAL_TABLET | Freq: Every day | ORAL | Status: DC
  Administered 2021-01-11 – 2021-01-18 (×8): 40 mg via ORAL
  Filled 2021-01-10 (×8): qty 2

## 2021-01-10 MED ORDER — METOPROLOL TARTRATE 5 MG/5ML IV SOLN
5.0000 mg | INTRAVENOUS | Status: DC | PRN
  Administered 2021-01-10: 5 mg via INTRAVENOUS
  Filled 2021-01-10: qty 5

## 2021-01-10 MED ORDER — LEVETIRACETAM IN NACL 1000 MG/100ML IV SOLN
1000.0000 mg | Freq: Once | INTRAVENOUS | Status: AC
  Administered 2021-01-10: 1000 mg via INTRAVENOUS
  Filled 2021-01-10: qty 100

## 2021-01-10 MED ORDER — ACETAMINOPHEN 325 MG RE SUPP: 650.0000 mg | Freq: Four times a day (QID) | RECTAL | Status: AC | PRN

## 2021-01-10 NOTE — ED Provider Notes (Signed)
[facility] Provider Note    Event Date/Time   First MD Initiated Contact with Patient 01/10/21 1924     (approximate)   History   Seizures (Pt from [facility] had 3 witnessed seizures ranging from 30 seconds to 1 minute. Pt has hx of epilepsy. Given 2 of versed by EMS. Post ictal on arrival)  Level V caveat:  AMS postictal  HPI  Jared Gregory is a 66 y.o. male with a history of A. fib hypertension as well as vascular dementia also reported status post renal transplant presents to the ER with 3 witnessed seizures lasting about 30 seconds each.  He was given 2 mg of Versed by EMS and arrives postictal.  Unable to provide much additional history.  Patient states he has a history of seizure but on review of recent [place] records there is no documentation seizure or any documentation that he is on antiepileptic medications.  He is on Eliquis.  Patient drowsy denies any pain.  Does not remember the last time he had a seizure.     Physical Exam   Triage Vital Signs: ED Triage Vitals  Enc Vitals Group     BP      Pulse      Resp      Temp      Temp src      SpO2      Weight      Height      Head Circumference      Peak Flow      Pain Score      Pain Loc      Pain Edu?      Excl. in [place]?     Most recent vital signs: Vitals:   01/10/21 2100 01/10/21 2307  BP: (!) 181/106 (!) 124/95  Pulse: (!) 34 (!) 139  Resp: (!) 21 20  Temp:    SpO2: 100% 100%     Constitutional: Alert but somewhat drowsy and postictal appearing.  Answering questions appropriately. Eyes: Conjunctivae are normal.  Head: Atraumatic. Nose: No congestion/rhinnorhea. Mouth/Throat: Mucous membranes are moist.   Neck: Painless ROM.  Cardiovascular:   Good peripheral circulation. Respiratory: Normal respiratory effort.  No retractions.  Gastrointestinal: Soft and nontender.  Musculoskeletal:  no deformity Neurologic:  MAE spontaneously.  No facial droop Skin:  Skin is warm, dry  and intact. No rash noted. Psychiatric: Calm and cooperative    ED Results / Procedures / Treatments   Labs (all labs ordered are listed, but only abnormal results are displayed) Labs Reviewed  CBC - Abnormal; Notable for the following components:      Result Value   RBC 3.14 (*)    Hemoglobin 7.3 (*)    HCT 24.1 (*)    MCV 76.8 (*)    MCH 23.2 (*)    RDW 25.3 (*)    All other components within normal limits  COMPREHENSIVE METABOLIC PANEL - Abnormal; Notable for the following components:   Glucose, Bld 169 (*)    BUN 73 (*)    Creatinine, Ser 3.17 (*)    Calcium 8.7 (*)    Albumin 2.8 (*)    GFR, Estimated 21 (*)    All other components within normal limits  TROPONIN I (HIGH SENSITIVITY) - Abnormal; Notable for the following components:   Troponin I (High Sensitivity) 40 (*)    All other components within normal limits  TROPONIN I (HIGH SENSITIVITY) - Abnormal; Notable for the following components:  Troponin I (High Sensitivity) 39 (*)    All other components within normal limits  RESP PANEL BY RT-PCR (FLU A&B, COVID) ARPGX2  HIV ANTIBODY (ROUTINE TESTING W REFLEX)  BASIC METABOLIC PANEL  CBC  VITAMIN B12  MAGNESIUM  PHOSPHORUS  TSH     EKG  ED ECG REPORT I, Merlyn Lot, the attending physician, personally viewed and interpreted this ECG.   Date: 01/10/2021  EKG Time: 20:30  Rate: 80  Rhythm: sinus  Axis: left  Intervals:normal  ST&T Change: poor r wave progression, no stemi    RADIOLOGY I personally reviewed all radiographic images ordered to evaluate for the above acute complaints and reviewed radiology reports and findings.  These findings were personally discussed with the patient.  Please see medical record for radiology report.    PROCEDURES:  Critical Care performed: No  Procedures   MEDICATIONS ORDERED IN ED: Medications  atorvastatin (LIPITOR) tablet 40 mg (has no administration in time range)  carvedilol (COREG) tablet 50 mg  (has no administration in time range)  DULoxetine (CYMBALTA) DR capsule 60 mg (has no administration in time range)  tamsulosin (FLOMAX) capsule 0.8 mg (has no administration in time range)  apixaban (ELIQUIS) tablet 5 mg (has no administration in time range)  acetaminophen (TYLENOL) tablet 1,000 mg (has no administration in time range)    Or  acetaminophen (TYLENOL) suppository 650 mg (has no administration in time range)  ondansetron (ZOFRAN) tablet 4 mg (has no administration in time range)    Or  ondansetron (ZOFRAN) injection 4 mg (has no administration in time range)  LORazepam (ATIVAN) injection 2 mg (2 mg Intravenous Given 01/10/21 2309)  cycloSPORINE (SANDIMMUNE) capsule 75 mg (has no administration in time range)  mycophenolate (MYFORTIC) EC tablet 180 mg (has no administration in time range)  Lacosamide TABS 100 mg (has no administration in time range)  Cholecalciferol 2,000 Units (has no administration in time range)  metoprolol tartrate (LOPRESSOR) injection 5 mg (5 mg Intravenous Given 01/10/21 2308)  levETIRAcetam (KEPPRA) IVPB 1000 mg/100 mL premix (0 mg Intravenous Stopped 01/10/21 2046)     IMPRESSION / MDM / ASSESSMENT AND PLAN / ED COURSE  I reviewed the triage vital signs and the nursing notes.                              Differential diagnosis includes, but is not limited to, epilepsy, seizure, hypoglycemia, dysrhythmia, ACS, mass, IPH, SDH, sepsis  Patient with extensive past medical history and presentation as described above presents apparently postictal versus still slightly sedated from Versed given by EMS.  CT imaging be ordered.  Chest x-ray per my review does not show any evidence of pneumothorax or significant pulmonary edema.  EKG without sign of dysrhythmia or electrolyte abnormality.  He is hypertensive.  The patient will be placed on continuous pulse oximetry and telemetry for monitoring.  Laboratory evaluation will be sent to evaluate for the above  complaints.      Clinical Course as of 01/10/21 2330  Tue Jan 10, 2021  2110 Patient reassessed does appear to have mild AKI with increased creatinine.  Hemoglobin appears stable no leukocytosis.  No metabolic acidosis.  My review of chest x-ray does have some mild edema and cardiomegaly but is not hypoxic.  Has his troponin is also mildly elevated we will continue to observe we will hold off on IV fluids at this time. unclear as whether this was syncope with  possible dysrhythmia versus true seizure he is following commands.  Based on his presentation and work-up thus far I discussed case in consultation with hospitalist for admission. [PR]    Clinical Course User Index [PR] Merlyn Lot, MD     FINAL CLINICAL IMPRESSION(S) / ED DIAGNOSES   Final diagnoses:  Seizure-like activity ([place])  Syncope, unspecified syncope type     Rx / DC Orders   ED Discharge Orders     None        Note:  This document was prepared using Dragon voice recognition software and may include unintentional dictation errors.    Merlyn Lot, MD 01/10/21 2330

## 2021-01-10 NOTE — H&P (Addendum)
History and Physical   Jared Gregory PXT:[id number] DOB: 1956/01/05 DOA: 01/10/2021  PCP: [facility] [place]  Patient coming from: [place], [place]  I have personally briefly reviewed patient's old medical records in [place].  Chief Concern: seizure  HPI: Jared Gregory is a 66 y.o. male with medical history significant for anxiety, depression, hyperlipidemia, atrial fibrillation on Coreg and Eliquis, cva in January 03, 2020, history of seizures on lacosamide, ESRD status post kidney transplant about 5 years ago in [place], who presents emergency department for chief concerns of seizure activity from facility.  He presents from rest home/stroke home. He said he is in the hospital because they presume he had seizure.  He states he does not remember what happened.  At bedside he is able to tell me his name, his age, he knows in the hospital.  He does not have any chest pain, shortness of breath, abdominal pain.  He states he has severe anxiety.  He is trying to get out of bed.  He is redirectable.  He asked for tech's hand.  He asked to hold my hand because he feels very anxious.  He states he is not trying to get out of bed.  He endorses compliance with his seizure medication.  Social history: he lives in a rest home. He denies tobacco, etoh, recreational drug use. Formerly he worked Teaching laboratory technician.  Vaccination history: He is vaccinated for covid 19.  ROS: Constitutional: no weight change, no fever ENT/Mouth: no sore throat, no rhinorrhea Eyes: no eye pain, no vision changes Cardiovascular: no chest pain, no dyspnea,  no edema, no palpitations Respiratory: no cough, no sputum, no wheezing Gastrointestinal: no nausea, no vomiting, no diarrhea, no constipation Genitourinary: no urinary incontinence, no dysuria, no hematuria Musculoskeletal: no arthralgias, no myalgias Skin: no skin lesions, no pruritus, Neuro: no weakness, no loss of consciousness, no  syncope Psych: no anxiety, no depression, no decrease appetite Heme/Lymph: no bruising, no bleeding  ED Course: Discussed with emergency medicine provider, patient requiring hospitalization for chief concerns of seizure.  Vitals in the emergency department showed temperature of 97.4, respiration rate of 20, heart rate of 96, initial blood pressure 194/112, improved to 181/106, SPO2 100% on room air.  Serum sodium 139, potassium 4.4, chloride 103, bicarb 26, BUN is 18, serum creatinine 3.17, nonfasting blood glucose 169, WBC 5.5, hemoglobin 7.3, platelets of 156.  GFR of 21.  Troponin was 40.  Assessment/Plan  Principal Problem:   Seizure ([place]) Active Problems:   Depression   Essential hypertension   On continuous oral anticoagulation   BPH (benign prostatic hyperplasia)   # Seizure activity-seizure precautions placed # Increased somnolence  - Post ictal - Etiology work-up in progress - Check procalcitonin - A.m. team to consult neurology for further recommendations Patient is status post Keppra 1 g IV - Resumed home lacosamide 100 mg p.o. twice daily - Ativan injection 2 mg IV as needed for breakthrough seizures, 3 doses ordered with instructions for nursing staff to alert provider once IV Ativan has been used - Admit to progressive cardiac, observation, telemetry - Aspiration, fall precautions, seizure precautions  # Elevated troponin - etiology unclear at this time - Troponin is downtrending which is reassuring - Complete echo ordered due to patient's HR has been difficult to control ranging from 110-150 - Patient is sleeping and difficult to arouse - Discussed with cross coverage provider and ICU - Check BNP, if elevated, patient will need lasix  # History of  cva in December of 2021 per spouse - MRI of the brain ordered  # AKI - etiology work up in process   # Depression/anxiety-resume duloxetine 60 mg daily  # History of atrial fibrillation-resumed Coreg 50 mg twice  daily, apixaban 5 mg p.o. twice daily starting on 01/11/2021 # BPH-Flomax resumed # Hyperlipidemia-atorvastatin 40 mg daily resumed # History of renal transplant-resumed home cyclosporine 75 mg twice daily, mycophenolate 180 mg p.o. twice daily  Chart reviewed.   DVT prophylaxis: Eliquis 5 mg p.o. twice daily Code Status: Full code Diet: Heart healthy Family Communication: Updated spouse over the phone Disposition Plan: Pending clinical course Consults called: None at this time would recommend a.m. team to consult neurology Admission status: inpatient, stepdown  Past Medical History:  Diagnosis Date   Atrial fibrillation ([place])    Diabetes ([place])    HTN (hypertension)    Hyperlipidemia    Renal transplant recipient    Vascular dementia [facility])    Past Surgical History:  Procedure Laterality Date   KIDNEY TRANSPLANT     Social History:  has no history on file for tobacco use, alcohol use, and drug use.  Allergies  Allergen Reactions   Baclofen    Lisinopril    Remeron [Mirtazapine]    Vancomycin    Zofran [Ondansetron]    No family history on file. Family history: Family history reviewed and not pertinent.  Prior to Admission medications   Medication Sig Start Date End Date Taking? Authorizing Provider  acetaminophen (TYLENOL) 325 MG tablet Take 2 tablets by mouth every 6 (six) hours as needed.    [provider]  Alogliptin Benzoate 6.25 MG TABS Take 1 tablet by mouth daily. 07/29/20   [provider]  amLODipine (NORVASC) 10 MG tablet Take 10 mg by mouth daily. 07/29/20   [provider]  atorvastatin (LIPITOR) 40 MG tablet Take 40 mg by mouth daily as needed. 07/29/20   [provider]  carvedilol (COREG) 25 MG tablet Take 2 tablets by mouth 2 (two) times daily. 07/29/20   [provider]  Cholecalciferol (VITAMIN D3) 50 MCG (2000 UT) TABS Take 1 tablet by mouth daily. 07/29/20   [provider]  cycloSPORINE (SANDIMMUNE)  25 MG capsule Take 3 capsules by mouth 2 (two) times daily. 07/29/20   [provider]  DULoxetine (CYMBALTA) 60 MG capsule Take 60 mg by mouth daily. 07/29/20   [provider]  ELIQUIS 5 MG TABS tablet Take 5 mg by mouth 2 (two) times daily. 08/30/20   [provider]  finasteride (PROSCAR) 5 MG tablet Take 5 mg by mouth daily. 07/29/20   [provider]  Lacosamide 100 MG TABS Take 1 tablet by mouth every 12 (twelve) hours. 09/01/20   [provider]  losartan (COZAAR) 100 MG tablet Take 100 mg by mouth daily. 07/29/20   [provider]  mycophenolate (MYFORTIC) 180 MG EC tablet Take 180 mg by mouth 2 (two) times daily. 08/01/20   [provider]  sodium bicarbonate 650 MG tablet Take 1 tablet by mouth 3 (three) times daily. 08/01/20   [provider]  tamsulosin (FLOMAX) 0.4 MG CAPS capsule Take 0.8 mg by mouth daily. 07/29/20   [provider]   Physical Exam: Vitals:   01/10/21 1926 01/10/21 2100 01/10/21 2307  BP: (!) 194/112 (!) 181/106 (!) 124/95  Pulse: 96 (!) 34 (!) 139  Resp: 20 (!) 21 20  Temp: (!) 97.4 F (36.3 C)  TempSrc: Axillary    SpO2: 100% 100% 100%  Weight: 76.2 kg    Height: 5\' 9"  (1.753 m)     Constitutional: appears older than chronological age, frail, NAD, calm, comfortable Eyes: PERRL, lids and conjunctivae normal ENMT: Mucous membranes are moist. Posterior pharynx clear of any exudate or lesions. Age-appropriate dentition. Hearing appropriate Neck: normal, supple, no masses, no thyromegaly Respiratory: clear to auscultation bilaterally, no wheezing, no crackles. Normal respiratory effort. No accessory muscle use.  Cardiovascular: Regular rate and rhythm, no murmurs / rubs / gallops. No extremity edema. 2+ pedal pulses. No carotid bruits.  Abdomen: no tenderness, no masses palpated, no hepatosplenomegaly. Bowel sounds positive.  Musculoskeletal: no clubbing / cyanosis. Good ROM, no  contractures, no atrophy. Normal muscle tone.  Bilateral upper and lower extremity atrophy. Skin: no rashes, lesions, ulcers. No induration Neurologic: Sensation intact. Strength 5/5 in all 4.  Psychiatric: Normal judgment and insight. Alert and oriented x 3. Normal mood.   EKG: independently reviewed, showing sinus rhythm with rate of 82, QTc 483  Chest x-ray on Admission: I personally reviewed and I agree with radiologist reading as below.  CT HEAD WO CONTRAST (5MM)  Result Date: 01/10/2021 CLINICAL DATA:  New onset seizure EXAM: CT HEAD WITHOUT CONTRAST TECHNIQUE: Contiguous axial images were obtained from the base of the skull through the vertex without intravenous contrast. COMPARISON:  09/30/2020 FINDINGS: Brain: Normal anatomic configuration. Parenchymal volume loss is commensurate with the patient's age. Stable moderate subcortical and periventricular white matter changes are present likely reflecting the sequela of small vessel ischemia. Remote infarcts are noted within the cerebellar hemispheres bilaterally. Lacunar infarct noted within the left basal ganglia and right thalamus. No abnormal intra or extra-axial mass lesion or fluid collection. No abnormal mass effect or midline shift. No evidence of acute intracranial hemorrhage or infarct. Ventricular size is normal. Cerebellum unremarkable. Vascular: No asymmetric hyperdense vasculature at the skull base. Moderate atherosclerotic calcification within the carotid siphons. Extensive arteriosclerosis involving the external carotid artery distribution bilaterally. Skull: Intact Sinuses/Orbits: Paranasal sinuses are clear. Orbits are unremarkable. Other: Mastoid air cells and middle ear cavities are clear. IMPRESSION: No acute intracranial hemorrhage or infarct. Stable remote infarcts within the left basal ganglia, right thalamus and cerebellar hemispheres bilaterally. Peripheral vascular disease Electronically Signed   By: Fidela Salisbury M.D.   On:  01/10/2021 20:33   DG Chest Portable 1 View  Result Date: 01/10/2021 CLINICAL DATA:  Weakness and altered mental status. EXAM: PORTABLE CHEST 1 VIEW COMPARISON:  None. FINDINGS: Low lung volumes are seen. Mild to moderate severity areas of bibasilar atelectasis and/or infiltrate. The cardiac silhouette is mildly enlarged. The visualized skeletal structures are unremarkable. IMPRESSION: Cardiomegaly with mild to moderate severity bibasilar atelectasis and/or infiltrate. Electronically Signed   By: Virgina Norfolk M.D.   On: 01/10/2021 19:55    Labs on Admission: I have personally reviewed following labs  CBC: Recent Labs  Lab 01/10/21 2020  WBC 5.5  HGB 7.3*  HCT 24.1*  MCV 76.8*  PLT 412   Basic Metabolic Panel: Recent Labs  Lab 01/10/21 2020  NA 139  K 4.4  CL 103  CO2 26  GLUCOSE 169*  BUN 73*  CREATININE 3.17*  CALCIUM 8.7*   GFR: Estimated Creatinine Clearance: 23.5 mL/min (A) (by C-G formula based on SCr of 3.17 mg/dL (H)).  Liver Function Tests: Recent Labs  Lab 01/10/21 2020  AST 16  ALT 7  ALKPHOS 51  BILITOT 1.0  PROT 7.7  ALBUMIN  2.8*   Urine analysis:    Component Value Date/Time   COLORURINE YELLOW (A) 09/30/2020 1539   APPEARANCEUR HAZY (A) 09/30/2020 1539   LABSPEC 1.014 09/30/2020 1539   PHURINE 5.0 09/30/2020 1539   GLUCOSEU NEGATIVE 09/30/2020 1539   HGBUR NEGATIVE 09/30/2020 1539   BILIRUBINUR NEGATIVE 09/30/2020 1539   KETONESUR NEGATIVE 09/30/2020 1539   PROTEINUR 30 (A) 09/30/2020 1539   NITRITE NEGATIVE 09/30/2020 1539   LEUKOCYTESUR NEGATIVE 09/30/2020 1539   Dr. Tobie Poet [facility]  If 7PM-7AM, please contact overnight-coverage provider If 7AM-7PM, please contact day coverage provider [email or web address]  01/11/2021, 1:07 AM

## 2021-01-11 ENCOUNTER — Inpatient Hospital Stay (HOSPITAL_COMMUNITY)
Admit: 2021-01-11 | Discharge: 2021-01-11 | Disposition: A | Discharge status: Home / Self Care | Payer: No Typology Code available for payment source | Attending: Internal Medicine | Admitting: Internal Medicine

## 2021-01-11 ENCOUNTER — Observation Stay: Payer: No Typology Code available for payment source

## 2021-01-11 ENCOUNTER — Inpatient Hospital Stay: Payer: No Typology Code available for payment source

## 2021-01-11 DIAGNOSIS — I248 Other forms of acute ischemic heart disease: Secondary | ICD-10-CM | POA: Diagnosis present

## 2021-01-11 DIAGNOSIS — I509 Heart failure, unspecified: Secondary | ICD-10-CM | POA: Diagnosis present

## 2021-01-11 DIAGNOSIS — I340 Nonrheumatic mitral (valve) insufficiency: Secondary | ICD-10-CM | POA: Diagnosis not present

## 2021-01-11 DIAGNOSIS — F32A Depression, unspecified: Secondary | ICD-10-CM | POA: Diagnosis present

## 2021-01-11 DIAGNOSIS — Z20822 Contact with and (suspected) exposure to covid-19: Secondary | ICD-10-CM | POA: Diagnosis present

## 2021-01-11 DIAGNOSIS — T8619 Other complication of kidney transplant: Secondary | ICD-10-CM | POA: Diagnosis present

## 2021-01-11 DIAGNOSIS — D631 Anemia in chronic kidney disease: Secondary | ICD-10-CM | POA: Diagnosis present

## 2021-01-11 DIAGNOSIS — C9000 Multiple myeloma not having achieved remission: Secondary | ICD-10-CM | POA: Diagnosis present

## 2021-01-11 DIAGNOSIS — N401 Enlarged prostate with lower urinary tract symptoms: Secondary | ICD-10-CM | POA: Diagnosis not present

## 2021-01-11 DIAGNOSIS — I342 Nonrheumatic mitral (valve) stenosis: Secondary | ICD-10-CM | POA: Diagnosis not present

## 2021-01-11 DIAGNOSIS — J441 Chronic obstructive pulmonary disease with (acute) exacerbation: Secondary | ICD-10-CM | POA: Diagnosis present

## 2021-01-11 DIAGNOSIS — E872 Acidosis, unspecified: Secondary | ICD-10-CM | POA: Diagnosis not present

## 2021-01-11 DIAGNOSIS — I4891 Unspecified atrial fibrillation: Secondary | ICD-10-CM | POA: Diagnosis not present

## 2021-01-11 DIAGNOSIS — N390 Urinary tract infection, site not specified: Secondary | ICD-10-CM | POA: Diagnosis present

## 2021-01-11 DIAGNOSIS — G9341 Metabolic encephalopathy: Secondary | ICD-10-CM | POA: Diagnosis present

## 2021-01-11 DIAGNOSIS — F0153 Vascular dementia, unspecified severity, with mood disturbance: Secondary | ICD-10-CM | POA: Diagnosis present

## 2021-01-11 DIAGNOSIS — Z940 Kidney transplant status: Secondary | ICD-10-CM | POA: Diagnosis not present

## 2021-01-11 DIAGNOSIS — I361 Nonrheumatic tricuspid (valve) insufficiency: Secondary | ICD-10-CM

## 2021-01-11 DIAGNOSIS — I69398 Other sequelae of cerebral infarction: Secondary | ICD-10-CM | POA: Diagnosis not present

## 2021-01-11 DIAGNOSIS — G40909 Epilepsy, unspecified, not intractable, without status epilepticus: Secondary | ICD-10-CM | POA: Diagnosis not present

## 2021-01-11 DIAGNOSIS — Y830 Surgical operation with transplant of whole organ as the cause of abnormal reaction of the patient, or of later complication, without mention of misadventure at the time of the procedure: Secondary | ICD-10-CM | POA: Diagnosis present

## 2021-01-11 DIAGNOSIS — J440 Chronic obstructive pulmonary disease with acute lower respiratory infection: Secondary | ICD-10-CM | POA: Diagnosis not present

## 2021-01-11 DIAGNOSIS — N179 Acute kidney failure, unspecified: Secondary | ICD-10-CM | POA: Diagnosis present

## 2021-01-11 DIAGNOSIS — N184 Chronic kidney disease, stage 4 (severe): Secondary | ICD-10-CM | POA: Diagnosis present

## 2021-01-11 DIAGNOSIS — N2581 Secondary hyperparathyroidism of renal origin: Secondary | ICD-10-CM | POA: Diagnosis present

## 2021-01-11 DIAGNOSIS — E1122 Type 2 diabetes mellitus with diabetic chronic kidney disease: Secondary | ICD-10-CM | POA: Diagnosis present

## 2021-01-11 DIAGNOSIS — I10 Essential (primary) hypertension: Secondary | ICD-10-CM | POA: Diagnosis not present

## 2021-01-11 DIAGNOSIS — R569 Unspecified convulsions: Secondary | ICD-10-CM | POA: Diagnosis present

## 2021-01-11 DIAGNOSIS — B1920 Unspecified viral hepatitis C without hepatic coma: Secondary | ICD-10-CM | POA: Diagnosis present

## 2021-01-11 DIAGNOSIS — F0154 Vascular dementia, unspecified severity, with anxiety: Secondary | ICD-10-CM | POA: Diagnosis present

## 2021-01-11 DIAGNOSIS — J9601 Acute respiratory failure with hypoxia: Secondary | ICD-10-CM | POA: Diagnosis not present

## 2021-01-11 DIAGNOSIS — G9340 Encephalopathy, unspecified: Secondary | ICD-10-CM | POA: Diagnosis not present

## 2021-01-11 DIAGNOSIS — I1310 Hypertensive heart and chronic kidney disease without heart failure, with stage 1 through stage 4 chronic kidney disease, or unspecified chronic kidney disease: Secondary | ICD-10-CM | POA: Diagnosis present

## 2021-01-11 DIAGNOSIS — J690 Pneumonitis due to inhalation of food and vomit: Secondary | ICD-10-CM | POA: Diagnosis not present

## 2021-01-11 LAB — URINE DRUG SCREEN, QUALITATIVE (ARMC ONLY)
Amphetamines, Ur Screen: NOT DETECTED
Barbiturates, Ur Screen: NOT DETECTED
Benzodiazepine, Ur Scrn: POSITIVE — AB
Cannabinoid 50 Ng, Ur ~~LOC~~: NOT DETECTED
Cocaine Metabolite,Ur ~~LOC~~: NOT DETECTED
MDMA (Ecstasy)Ur Screen: NOT DETECTED
Methadone Scn, Ur: NOT DETECTED
Opiate, Ur Screen: NOT DETECTED
Phencyclidine (PCP) Ur S: NOT DETECTED
Tricyclic, Ur Screen: NOT DETECTED

## 2021-01-11 LAB — HEMOGLOBIN A1C
Hgb A1c MFr Bld: 5.2 % (ref 4.8–5.6)
Mean Plasma Glucose: 102.54 mg/dL

## 2021-01-11 LAB — ECHOCARDIOGRAM COMPLETE
AR max vel: 2.03 cm2
AV Area VTI: 2.03 cm2
AV Area mean vel: 2.07 cm2
AV Mean grad: 5 mmHg
AV Peak grad: 11.3 mmHg
Ao pk vel: 1.68 m/s
Area-P 1/2: 2.48 cm2
Height: 69 in
MV VTI: 1.36 cm2
S' Lateral: 2.52 cm
Weight: 2687.85 oz

## 2021-01-11 LAB — CBG MONITORING, ED
Glucose-Capillary: 102 mg/dL — ABNORMAL HIGH (ref 70–99)
Glucose-Capillary: 106 mg/dL — ABNORMAL HIGH (ref 70–99)
Glucose-Capillary: 123 mg/dL — ABNORMAL HIGH (ref 70–99)
Glucose-Capillary: 132 mg/dL — ABNORMAL HIGH (ref 70–99)
Glucose-Capillary: 144 mg/dL — ABNORMAL HIGH (ref 70–99)

## 2021-01-11 LAB — ETHANOL: Alcohol, Ethyl (B): <10 mg/dL (ref <10)

## 2021-01-11 LAB — RESP PANEL BY RT-PCR (FLU A&B, COVID) ARPGX2
Influenza A by PCR: NEGATIVE
Influenza B by PCR: NEGATIVE
SARS Coronavirus 2 by RT PCR: NEGATIVE

## 2021-01-11 LAB — MAGNESIUM: Magnesium: 1.5 mg/dL — ABNORMAL LOW (ref 1.7–2.4)

## 2021-01-11 LAB — BLOOD GAS, ARTERIAL
Acid-Base Excess: 2.5 mmol/L — ABNORMAL HIGH (ref 0.0–2.0)
Bicarbonate: 27.2 mmol/L (ref 20.0–28.0)
O2 Saturation: 94.6 %
Patient temperature: 37
pCO2 arterial: 42 mmHg (ref 32.0–48.0)
pH, Arterial: 7.42 (ref 7.350–7.450)
pO2, Arterial: 72 mmHg — ABNORMAL LOW (ref 83.0–108.0)

## 2021-01-11 LAB — VITAMIN B12: Vitamin B-12: 462 pg/mL (ref 180–914)

## 2021-01-11 LAB — PHOSPHORUS: Phosphorus: 4.4 mg/dL (ref 2.5–4.6)

## 2021-01-11 LAB — AMMONIA: Ammonia: 13 umol/L (ref 9–35)

## 2021-01-11 LAB — TSH: TSH: 3.422 u[IU]/mL (ref 0.350–4.500)

## 2021-01-11 LAB — HIV ANTIBODY (ROUTINE TESTING W REFLEX): HIV Screen 4th Generation wRfx: NONREACTIVE

## 2021-01-11 LAB — BRAIN NATRIURETIC PEPTIDE: B Natriuretic Peptide: 1828.8 pg/mL — ABNORMAL HIGH (ref 0.0–100.0)

## 2021-01-11 MED ORDER — METOPROLOL TARTRATE 5 MG/5ML IV SOLN
5.0000 mg | Freq: Four times a day (QID) | INTRAVENOUS | Status: DC | PRN
  Administered 2021-01-11 – 2021-01-13 (×3): 5 mg via INTRAVENOUS
  Filled 2021-01-11 (×3): qty 5

## 2021-01-11 MED ORDER — SODIUM CHLORIDE 0.9 % IV SOLN: INTRAVENOUS | Status: DC

## 2021-01-11 MED ORDER — SODIUM CHLORIDE 0.9 % IV SOLN
150.0000 mg | Freq: Two times a day (BID) | INTRAVENOUS | Status: DC
  Administered 2021-01-11 – 2021-01-18 (×14): 150 mg via INTRAVENOUS
  Filled 2021-01-11 (×17): qty 15

## 2021-01-11 MED ORDER — DILTIAZEM HCL-DEXTROSE 125-5 MG/125ML-% IV SOLN (PREMIX)
5.0000 mg/h | INTRAVENOUS | Status: DC
  Administered 2021-01-11: 5 mg/h via INTRAVENOUS

## 2021-01-11 MED ORDER — LOSARTAN POTASSIUM 50 MG PO TABS
50.0000 mg | ORAL_TABLET | Freq: Every day | ORAL | Status: DC
  Administered 2021-01-12: 50 mg via ORAL
  Filled 2021-01-11: qty 1

## 2021-01-11 MED ORDER — SODIUM CHLORIDE 0.9 % IV SOLN
100.0000 mg | Freq: Two times a day (BID) | INTRAVENOUS | Status: DC
  Administered 2021-01-11: 100 mg via INTRAVENOUS
  Filled 2021-01-11 (×3): qty 10

## 2021-01-11 MED ORDER — LINAGLIPTIN 5 MG PO TABS
5.0000 mg | ORAL_TABLET | Freq: Every day | ORAL | Status: DC
  Administered 2021-01-12 – 2021-01-19 (×6): 5 mg via ORAL
  Filled 2021-01-11 (×10): qty 1

## 2021-01-11 MED ORDER — DILTIAZEM HCL-DEXTROSE 125-5 MG/125ML-% IV SOLN (PREMIX)
INTRAVENOUS | Status: AC
  Filled 2021-01-11: qty 125

## 2021-01-11 MED ORDER — METOPROLOL TARTRATE 5 MG/5ML IV SOLN: 10.0000 mg | Freq: Once | INTRAVENOUS | Status: AC

## 2021-01-11 MED ORDER — DILTIAZEM HCL 30 MG PO TABS: 30.0000 mg | ORAL_TABLET | Freq: Four times a day (QID) | ORAL | Status: DC | PRN

## 2021-01-11 MED ORDER — MAGNESIUM SULFATE 2 GM/50ML IV SOLN
2.0000 g | Freq: Once | INTRAVENOUS | Status: AC
  Administered 2021-01-11: 2 g via INTRAVENOUS
  Filled 2021-01-11: qty 50

## 2021-01-11 MED ORDER — METOPROLOL TARTRATE 5 MG/5ML IV SOLN
INTRAVENOUS | Status: AC
  Administered 2021-01-11: 10 mg via INTRAVENOUS
  Filled 2021-01-11: qty 10

## 2021-01-11 MED ORDER — HYDRALAZINE HCL 20 MG/ML IJ SOLN
10.0000 mg | Freq: Four times a day (QID) | INTRAMUSCULAR | Status: DC | PRN
  Administered 2021-01-11 – 2021-01-16 (×4): 10 mg via INTRAVENOUS
  Filled 2021-01-11 (×4): qty 1

## 2021-01-11 NOTE — ED Notes (Signed)
Pt returned from MRI Pt is more alert, opens eyes to voice and verbally response to questions

## 2021-01-11 NOTE — ED Notes (Signed)
Tele-neuro consulted patient with this RN in room.

## 2021-01-11 NOTE — Consult Note (Addendum)
Neurology Consultation Reason for Consult: Seizure Referring Physician: Dwyane Dee, D  CC: Seizure  History is obtained from:patient  HPI: Jared Gregory is a 66 y.o. male with a history of stroke, vascular dementia and epilepsy who presents with three breakthrough seizures. He reports that he is not aware during his seizures, but is aware afterwards that he had one. He states that he "stares off." He has not missed any doses of his vimpat.   He also notes taht he occasionally drops things. He also fell recently, stating that his leg just "gave out" and hit his left eye.   ROS: A 14 point ROS was performed and is negative except as noted in the HPI. Marland Kitchen   Past Medical History:  Diagnosis Date   Atrial fibrillation ([place])    Diabetes ([place])    HTN (hypertension)    Hyperlipidemia    Renal transplant recipient    Vascular dementia ([place])      FHx: No history of similar   Social History: denies etoh   Exam: Current vital signs: BP (!) 200/107    Pulse 69    Temp (!) 97.4 F (36.3 C) (Axillary)    Resp 14    Ht 5\' 9"  (1.753 m)    Wt 76.2 kg    SpO2 100%    BMI 24.81 kg/m  Vital signs in last 24 hours: Temp:  [97.4 F (36.3 C)] 97.4 F (36.3 C) (01/03 1926) Pulse Rate:  [34-139] 69 (01/04 1800) Resp:  [13-30] 14 (01/04 1730) BP: (112-202)/(87-116) 200/107 (01/04 1800) SpO2:  [92 %-100 %] 100 % (01/04 1800) Weight:  [76.2 kg] 76.2 kg (01/03 1926)   Physical Exam  Constitutional: Appears well-developed and well-nourished.  Psych: Affect appropriate to situation Eyes: No scleral injection HENT: left eye slightly swollen MSK: no joint deformities.  Cardiovascular: Normal rate and regular rhythm.  Respiratory: Effort normal, non-labored breathing GI: Soft.  No distension. There is no tenderness.  Skin: WDI  Neuro: Mental Status: Patient is awake, alert, oriented to person, place, month, year, and situation. Patient is able to give a clear and coherent history No signs of  aphasia or neglect Cranial Nerves: II: Visual Fields are full. Pupils are equal, round, and reactive to light.   III,IV, VI: EOMI without ptosis or diploplia.  V: Facial sensation is symmetric to temperature VII: Facial movement with mild left weakness.  VIII: hearing is intact to voice X: Uvula elevates symmetrically XI: Shoulder shrug is symmetric. XII: tongue is midline without atrophy or fasciculations.  Motor: Tone is normal. Bulk is normal. 5/5 strength was present proximally on the right, worse in the right hand.  4/5 in left arm and leg.  Sensory: Sensation is symmetric to light touch and temperature in the arms and legs. Cerebellar: FNF and HKS are intact bilaterally  He has mild asterixis.    I have reviewed labs in epic and the results pertinent to this consultation are: Results for orders placed or performed during the hospital encounter of 01/10/21 (from the past 24 hour(s))  CBC     Status: Abnormal   Collection Time: 01/10/21  8:20 PM  Result Value Ref Range   WBC 5.5 4.0 - 10.5 K/uL   RBC 3.14 (L) 4.22 - 5.81 MIL/uL   Hemoglobin 7.3 (L) 13.0 - 17.0 g/dL   HCT 24.1 (L) 39.0 - 52.0 %   MCV 76.8 (L) 80.0 - 100.0 fL   MCH 23.2 (L) 26.0 - 34.0 pg   MCHC 30.3  30.0 - 36.0 g/dL   RDW 25.3 (H) 11.5 - 15.5 %   Platelets 156 150 - 400 K/uL   nRBC 0.0 0.0 - 0.2 %  Comprehensive metabolic panel     Status: Abnormal   Collection Time: 01/10/21  8:20 PM  Result Value Ref Range   Sodium 139 135 - 145 mmol/L   Potassium 4.4 3.5 - 5.1 mmol/L   Chloride 103 98 - 111 mmol/L   CO2 26 22 - 32 mmol/L   Glucose, Bld 169 (H) 70 - 99 mg/dL   BUN 73 (H) 8 - 23 mg/dL   Creatinine, Ser 3.17 (H) 0.61 - 1.24 mg/dL   Calcium 8.7 (L) 8.9 - 10.3 mg/dL   Total Protein 7.7 6.5 - 8.1 g/dL   Albumin 2.8 (L) 3.5 - 5.0 g/dL   AST 16 15 - 41 U/L   ALT 7 0 - 44 U/L   Alkaline Phosphatase 51 38 - 126 U/L   Total Bilirubin 1.0 0.3 - 1.2 mg/dL   GFR, Estimated 21 (L) >60 mL/min   Anion gap 10  5 - 15  Troponin I (High Sensitivity)     Status: Abnormal   Collection Time: 01/10/21  8:20 PM  Result Value Ref Range   Troponin I (High Sensitivity) 40 (H) <18 ng/L  Troponin I (High Sensitivity)     Status: Abnormal   Collection Time: 01/10/21  9:32 PM  Result Value Ref Range   Troponin I (High Sensitivity) 39 (H) <18 ng/L  HIV Antibody (routine testing w rflx)     Status: None   Collection Time: 01/10/21  9:32 PM  Result Value Ref Range   HIV Screen 4th Generation wRfx Non Reactive Non Reactive  Vitamin B12     Status: None   Collection Time: 01/10/21  9:38 PM  Result Value Ref Range   Vitamin B-12 462 180 - 914 pg/mL  CBG monitoring, ED     Status: Abnormal   Collection Time: 01/11/21 12:37 AM  Result Value Ref Range   Glucose-Capillary 144 (H) 70 - 99 mg/dL  Magnesium     Status: Abnormal   Collection Time: 01/11/21  1:11 AM  Result Value Ref Range   Magnesium 1.5 (L) 1.7 - 2.4 mg/dL  Phosphorus     Status: None   Collection Time: 01/11/21  1:11 AM  Result Value Ref Range   Phosphorus 4.4 2.5 - 4.6 mg/dL  TSH     Status: None   Collection Time: 01/11/21  1:11 AM  Result Value Ref Range   TSH 3.422 0.350 - 4.500 uIU/mL  Resp Panel by RT-PCR (Flu A&B, Covid) Nasopharyngeal Swab     Status: None   Collection Time: 01/11/21  1:11 AM   Specimen: Nasopharyngeal Swab; Nasopharyngeal(NP) swabs in vial transport medium  Result Value Ref Range   SARS Coronavirus 2 by RT PCR NEGATIVE NEGATIVE   Influenza A by PCR NEGATIVE NEGATIVE   Influenza B by PCR NEGATIVE NEGATIVE  Hemoglobin A1c     Status: None   Collection Time: 01/11/21  1:11 AM  Result Value Ref Range   Hgb A1c MFr Bld 5.2 4.8 - 5.6 %   Mean Plasma Glucose 102.54 mg/dL  Ammonia     Status: None   Collection Time: 01/11/21  1:11 AM  Result Value Ref Range   Ammonia 13 9 - 35 umol/L  Urine Drug Screen, Qualitative ([facility] only)     Status: Abnormal   Collection Time: 01/11/21  1:74 AM  Result Value Ref Range    Tricyclic, Ur Screen NONE DETECTED NONE DETECTED   Amphetamines, Ur Screen NONE DETECTED NONE DETECTED   MDMA (Ecstasy)Ur Screen NONE DETECTED NONE DETECTED   Cocaine Metabolite,Ur [place] NONE DETECTED NONE DETECTED   Opiate, Ur Screen NONE DETECTED NONE DETECTED   Phencyclidine (PCP) Ur S NONE DETECTED NONE DETECTED   Cannabinoid 50 Ng, Ur [place] NONE DETECTED NONE DETECTED   Barbiturates, Ur Screen NONE DETECTED NONE DETECTED   Benzodiazepine, Ur Scrn POSITIVE (A) NONE DETECTED   Methadone Scn, Ur NONE DETECTED NONE DETECTED  Ethanol     Status: None   Collection Time: 01/11/21  1:57 AM  Result Value Ref Range   Alcohol, Ethyl (B) <10 <10 mg/dL  Brain natriuretic peptide     Status: Abnormal   Collection Time: 01/11/21  1:57 AM  Result Value Ref Range   B Natriuretic Peptide 1,828.8 (H) 0.0 - 100.0 pg/mL  Blood gas, arterial     Status: Abnormal   Collection Time: 01/11/21  2:24 AM  Result Value Ref Range   pH, Arterial 7.42 7.350 - 7.450   pCO2 arterial 42 32.0 - 48.0 mmHg   pO2, Arterial 72 (L) 83.0 - 108.0 mmHg   Bicarbonate 27.2 20.0 - 28.0 mmol/L   Acid-Base Excess 2.5 (H) 0.0 - 2.0 mmol/L   O2 Saturation 94.6 %   Patient temperature 37.0    Collection site RIGHT RADIAL    Sample type ARTERIAL DRAW    Allens test (pass/fail) PASS PASS  CBG monitoring, ED     Status: Abnormal   Collection Time: 01/11/21  8:09 AM  Result Value Ref Range   Glucose-Capillary 132 (H) 70 - 99 mg/dL  CBG monitoring, ED     Status: Abnormal   Collection Time: 01/11/21 12:49 PM  Result Value Ref Range   Glucose-Capillary 123 (H) 70 - 99 mg/dL  CBG monitoring, ED     Status: Abnormal   Collection Time: 01/11/21  5:52 PM  Result Value Ref Range   Glucose-Capillary 102 (H) 70 - 99 mg/dL     I have reviewed the images obtained:MRI brain - extensive white matter disease, no stroke.   Impression: 66 yo M with a history of epilepsy with breakthrough seizures. Without obvious precipitant, I would favor  increasing his vimpat slightly. He also appears to have asterixis, which I think is contributing to his recent fall and dropping things. Though his renal insufficiency is not acute, with a BUN of 73, this could be contributing. I will also ask pharmacy to review meds for possible culprits.   Recommendations: 1) increase vimpat to 150mg  BID 2) EEG 3) pharmacy consult 4) neurology will follow   Roland Rack, MD [facility] [phone]  If 7pm- 7am, please page neurology on call as listed in [place].

## 2021-01-11 NOTE — ED Notes (Signed)
Provider notified of increasing BP after PRN lopressor.  Written order to give 10mg  Lopressor once stat.

## 2021-01-11 NOTE — ED Notes (Signed)
RN attempted to wake pt for morning PO medication. Pt opened eyes to sternal rub and moaned but not alert enough for PO medications at this time

## 2021-01-11 NOTE — Procedures (Signed)
History: 66 yo M with a history of stroke presenting with seizure  Sedation: None  Technique: This EEG was acquired with electrodes placed according to the International 10-20 electrode system (including Fp1, Fp2, F3, F4, C3, C4, P3, P4, O1, O2, T3, T4, T5, T6, A1, A2, Fz, Cz, Pz). The following electrodes were missing or displaced: none.   Background: The background consists of intermixed alpha and beta activities. There is a well defined posterior dominant rhythm of 8 Hz that attenuates with eye opening. Sleep is recorded with normal appearing structures.   Photic stimulation: Physiologic driving is not performed  EEG Abnormalities: none  Clinical Interpretation: This normal EEG is recorded in the waking and sleep state. There was no seizure or seizure predisposition recorded on this study. Please note that lack of epileptiform activity on EEG does not preclude the possibility of epilepsy.   Roland Rack, MD [facility] [phone]  If 7pm- 7am, please page neurology on call as listed in [place].

## 2021-01-11 NOTE — Progress Notes (Signed)
[facility] Progress Note  Patient: Jared Gregory    DZH:[id number]  DOA: 01/10/2021     Date of Service: the patient was seen and examined on 01/11/2021  Chief Complaint  Patient presents with   Seizures    Pt from [facility] had 3 witnessed seizures ranging from 30 seconds to 1 minute. Pt has hx of epilepsy. Given 2 of versed by EMS. Post ictal on arrival   Brief hospital course: Jared Gregory is a 66 y.o. male with medical history significant for anxiety, depression, hyperlipidemia, atrial fibrillation on Coreg and Eliquis, cva in January 03, 2020, history of seizures on lacosamide, ESRD status post kidney transplant about 5 years ago in [place], who presents emergency department for chief concerns of seizure activity from facility. ED Course: Discussed with emergency medicine provider, patient requiring hospitalization for chief concerns of seizure.  Vitals in the emergency department showed temperature of 97.4, respiration rate of 20, heart rate of 96, initial blood pressure 194/112, improved to 181/106, SPO2 100% on room air.  Serum sodium 139, potassium 4.4, chloride 103, bicarb 26, BUN is 18, serum creatinine 3.17, nonfasting blood glucose 169, WBC 5.5, hemoglobin 7.3, platelets of 156.  GFR of 21.  Troponin was 40.        Assessment and Plan:   Principal Problem:   Seizure ([place]) Active Problems:   Depression   Essential hypertension   On continuous oral anticoagulation   BPH (benign prostatic hyperplasia)   # Seizure activity # Increased somnolence, Post ictal and due to ativan  - Etiology work-up in progress, Patient is status post Keppra 1 g IV - Resumed home lacosamide 100 mg p.o. twice daily - Ativan injection 2 mg IV as needed for breakthrough seizures, 3 doses ordered with instructions for nursing staff to alert provider once IV Ativan has been used - continue aspiration, fall precautions, seizure precautions Follow EEG Follow neurology consult   # Elevated  troponin - etiology unclear at this time - Troponin is downtrending which is reassuring - Complete echo ordered due to patient's HR has been difficult to control ranging from 110-150 - Patient is sleeping and difficult to arouse - BNP 1828 elevated, but clinically euvolemic, avoided Lasix due to AKI  Follow TTE  # History of cva in December of 2021 per spouse - MRI of the brain No acute infarct, hemorrhage, or mass. Continue Lipitor and patient is on Eliquis  # AKI on CKD stage IV, baseline creatinine 2.69 in sep 2022 - etiology work up in process  # History of renal transplant-resumed home cyclosporine 75 mg twice daily, mycophenolate 180 mg p.o. twice daily Check bladder scan to rule out urine retention Started IV fluid for gentle hydration as patient has decreased oral intake due to somnolence Check creatinine level tomorrow a.m. Consider nephrology consult if no improvement in the renal functions    # Depression/anxiety-resume duloxetine 60 mg daily  #HTN and h/o atrial fibrillation-resumed Coreg 50 mg twice daily, apixaban 5 mg p.o. twice daily starting on 01/11/2021 Patient had A. fib with RVR, Cardizem drip was started.   BPH-Flomax resumed   Hypomagnesemia, mag repleted.   Body mass index is 24.81 kg/m.  Interventions:       Diet: Renal diet DVT Prophylaxis: Therapeutic Anticoagulation with Eliquis    Advance goals of care discussion: Full code  Family Communication: family was NOT present at bedside, at the time of interview.  The pt provided permission to discuss medical plan with the family. Opportunity  was given to ask question and all questions were answered satisfactorily.   Disposition:  Pt is from Home, admitted with seizure, still has postictal symptoms, which precludes a safe discharge. Discharge to home, when clinically stable, needs clearance by neurology..  Subjective: Patient was admitted yesterday due to seizure activity, still patient is very  somnolent, he opens eyes very briefly and was dozing off, unable to offer any complaints, seems to be resting comfortably.  Physical Exam: General:  obtunded not oriented to time, place, and person.  Appear in no distress, affect unresponsive Eyes: closed, briefly opened, dozed off, postictal ENT: Oral Mucosa Clear, moist  Neck: no JVD,  Cardiovascular: S1 and S2 Present, no Murmur,  Respiratory: good respiratory effort, Bilateral Air entry equal and Decreased, no Crackles, no wheezes Abdomen: Bowel Sound present, Soft and no tenderness,  Skin: no rashes Extremities: no Pedal edema, no calf tenderness Neurologic: without any new focal findings Gait not checked due to patient safety concerns  Vitals:   01/11/21 1100 01/11/21 1248 01/11/21 1407 01/11/21 1500  BP: (!) 166/88 (!) 188/104 (!) 185/101 (!) 202/99  Pulse: 66 69 72 67  Resp: 18 18 15 13   Temp:      TempSrc:      SpO2: 99% 99% 100% 97%  Weight:      Height:        Intake/Output Summary (Last 24 hours) at 01/11/2021 1539 Last data filed at 01/11/2021 0836 Gross per 24 hour  Intake 210.04 ml  Output --  Net 210.04 ml   Filed Weights   01/10/21 1926  Weight: 76.2 kg    Data Reviewed: I have personally reviewed and interpreted daily labs, tele strips, imagings as discussed above. I reviewed all nursing notes, pharmacy notes, vitals, pertinent old records I have discussed plan of care as described above with RN and patient/family.  CBC: Recent Labs  Lab 01/10/21 2020  WBC 5.5  HGB 7.3*  HCT 24.1*  MCV 76.8*  PLT 353   Basic Metabolic Panel: Recent Labs  Lab 01/10/21 2020 01/11/21 0111  NA 139  --   K 4.4  --   CL 103  --   CO2 26  --   GLUCOSE 169*  --   BUN 73*  --   CREATININE 3.17*  --   CALCIUM 8.7*  --   MG  --  1.5*  PHOS  --  4.4    Studies: CT HEAD WO CONTRAST (5MM)  Result Date: 01/10/2021 CLINICAL DATA:  New onset seizure EXAM: CT HEAD WITHOUT CONTRAST TECHNIQUE: Contiguous axial  images were obtained from the base of the skull through the vertex without intravenous contrast. COMPARISON:  09/30/2020 FINDINGS: Brain: Normal anatomic configuration. Parenchymal volume loss is commensurate with the patient's age. Stable moderate subcortical and periventricular white matter changes are present likely reflecting the sequela of small vessel ischemia. Remote infarcts are noted within the cerebellar hemispheres bilaterally. Lacunar infarct noted within the left basal ganglia and right thalamus. No abnormal intra or extra-axial mass lesion or fluid collection. No abnormal mass effect or midline shift. No evidence of acute intracranial hemorrhage or infarct. Ventricular size is normal. Cerebellum unremarkable. Vascular: No asymmetric hyperdense vasculature at the skull base. Moderate atherosclerotic calcification within the carotid siphons. Extensive arteriosclerosis involving the external carotid artery distribution bilaterally. Skull: Intact Sinuses/Orbits: Paranasal sinuses are clear. Orbits are unremarkable. Other: Mastoid air cells and middle ear cavities are clear. IMPRESSION: No acute intracranial hemorrhage or infarct. Stable remote infarcts within  the left basal ganglia, right thalamus and cerebellar hemispheres bilaterally. Peripheral vascular disease Electronically Signed   By: Fidela Salisbury M.D.   On: 01/10/2021 20:33   MR BRAIN WO CONTRAST  Result Date: 01/11/2021 CLINICAL DATA:  Mental status change, unknown cause EXAM: MRI HEAD WITHOUT CONTRAST TECHNIQUE: Multiplanar, multiecho pulse sequences of the brain and surrounding structures were obtained without intravenous contrast. COMPARISON:  None. FINDINGS: Motion artifact is present. Brain: There is no acute infarction or intracranial hemorrhage. There is no intracranial mass, mass effect, or edema. There is no hydrocephalus or extra-axial fluid collection. There are chronic infarcts of the basal ganglia, thalamus, pons, and cerebellum.  Some demonstrate corresponding chronic blood products. Additional patchy and confluent T2 hyperintensity in the supratentorial and pontine white matter is nonspecific but probably reflects moderate to marked chronic microvascular ischemic changes. Prominence of the ventricles and sulci reflects parenchymal volume loss. Vascular: Major vessel flow voids at the skull base are preserved. Skull and upper cervical spine: Normal marrow signal is preserved. Sinuses/Orbits: Paranasal sinuses are aerated. Orbits are unremarkable. Other: Sella is unremarkable.  Mastoid air cells are clear. IMPRESSION: Motion degraded study. No acute infarct, hemorrhage, or mass. Moderate to marked chronic microvascular ischemic changes. Multiple chronic small vessel infarcts, some of which demonstrate chronic blood products. Electronically Signed   By: Macy Mis M.D.   On: 01/11/2021 12:18   DG Chest Portable 1 View  Result Date: 01/10/2021 CLINICAL DATA:  Weakness and altered mental status. EXAM: PORTABLE CHEST 1 VIEW COMPARISON:  None. FINDINGS: Low lung volumes are seen. Mild to moderate severity areas of bibasilar atelectasis and/or infiltrate. The cardiac silhouette is mildly enlarged. The visualized skeletal structures are unremarkable. IMPRESSION: Cardiomegaly with mild to moderate severity bibasilar atelectasis and/or infiltrate. Electronically Signed   By: Virgina Norfolk M.D.   On: 01/10/2021 19:55    Scheduled Meds:  apixaban  5 mg Oral BID   atorvastatin  40 mg Oral QHS   carvedilol  50 mg Oral BID WC   cholecalciferol  2,000 Units Oral Daily   cycloSPORINE  75 mg Oral BID   DULoxetine  60 mg Oral Daily   insulin aspart  0-5 Units Subcutaneous QHS   insulin aspart  0-9 Units Subcutaneous TID WC   linagliptin  5 mg Oral Daily   losartan  50 mg Oral Daily   mycophenolate  180 mg Oral BID   tamsulosin  0.8 mg Oral QPC supper   Continuous Infusions:  diltiazem (CARDIZEM) infusion Stopped (01/11/21 0810)    lacosamide (VIMPAT) IV Stopped (01/11/21 0836)   PRN Meds: acetaminophen **OR** acetaminophen, diltiazem, LORazepam, metoprolol tartrate, ondansetron **OR** ondansetron (ZOFRAN) IV  Time spent: 35 minutes  Author: Val Riles. MD [facility] 01/11/2021 3:39 PM  To reach On-call, see care teams to locate the attending and reach out to them via [email or web address]. If 7PM-7AM, please contact night-coverage If you still have difficulty reaching the attending provider, please page the [facility] (Director on Call) for [facility] on amion for assistance.

## 2021-01-11 NOTE — ED Notes (Signed)
Pt transported to MRI 

## 2021-01-11 NOTE — ED Notes (Signed)
Diltiazem drip stopped per provider. HR 64-75 for over 1hr.

## 2021-01-11 NOTE — Progress Notes (Signed)
*  PRELIMINARY RESULTS* Echocardiogram 2D Echocardiogram has been performed.  Jared Gregory 01/11/2021, 1:53 PM

## 2021-01-11 NOTE — Progress Notes (Signed)
Eeg done 

## 2021-01-11 NOTE — ED Notes (Signed)
Pt family member updated on pt status and plan of care

## 2021-01-11 NOTE — Progress Notes (Addendum)
[facility] - Significant Event  Received message from nursing staff that patient has increased somnolence and not able to take p.o. intake.  I evaluated patient at bedside, patient does move his extremities he is breathing, and minimally opens his eyes.  He appears to be deeply sleeping.  # Increased somnolence - Query postictal vs ativan 2 mg IV given at 2309 - I called and discussed case with telemetry service/coordinator and am awaiting their call. - MRI of the brain has been ordered, complete echo ordered, ammonia level ordered, UDS, ethanol level ordered, BNP ordered. - Increased for level to stepdown, inpatient - Discussed with cross coverage provider - Discussed plan with nursing  Dr. Tobie Poet  CRITICAL CARE Performed by: Criss Alvine  Total critical care time: 30 minutes  Critical care time was exclusive of separately billable procedures and treating other patients.  Critical care was necessary to treat or prevent imminent or life-threatening deterioration.  Critical care was time spent personally by me on the following activities: development of treatment plan with patient and/or surrogate as well as nursing, discussions with consultants, evaluation of patient's response to treatment, examination of patient, obtaining history from patient or surrogate, ordering and performing treatments and interventions, ordering and review of laboratory studies, ordering and review of radiographic studies, pulse oximetry and re-evaluation of patient's condition.

## 2021-01-11 NOTE — Consult Note (Signed)
[facility] TeleNeurology Consult Services  Stat Consult  Patient Name:   Jared Gregory, Jared Gregory Date of Birth:   1955/05/12 Identification Number:   MRN - [id number] Date of Service:   01/11/2021 01:51:48  Diagnosis:       R56.9 - Seizures  Impression 66 yo male with history of HTN, HLD, DM, afib on Eliquis, prior strokes, epilepsy on Vimpat, ESRD s/p renal transplant, anxiety, CHF presenting to the ED from nursing facility following 3 witnessed breakthrough seizures. He remains somnolent and difficult to arouse, confused, unable to follow commands, generalized weakness (L>R). Concern for possible prolonged postictal state v subclinical seizure activity. Recommend STAT EEG or LTM when able. MRI Brain w/wo for further work-up. Transition Vimpat from PO to IV, 100 mg BID. Etiology for breakthrough seizure not entirely clear. Infectious/metabolic work-up and treatment per primary team. Continue to monitor and replete electrolytes.  CT HEAD: As Per Radiologist CT Head Showed No Acute Hemorrhage or Acute Core Infarct  Our recommendations are outlined below.  Diagnostic Studies: MRI brain without contrast Stat EEG or LTM  Medications: Continue Vimpat 100 mg BID, transition to IV  Nursing Recommendations: Maintain Euglycemia and Euthermia Neuro checks q1-2 hrs during ICU stay if critical Once stable neuro checks q 4hrs  DVT Prophylaxis: Choice of Primary Team  Disposition: Neurology will follow  Additional Recommendations:: Monitor and replete electrolytes (K goal > 4.0, Mg goal > 2.0)   Metrics: [facility] Notification Time: 01/11/2021 01:49:10 Stamp Time: 01/11/2021 01:51:48 Callback Response Time: 01/11/2021 01:52:46   ----------------------------------------------------------------------------------------------------  Chief Complaint: breakthrough seizure  History of Present Illness: Patient is a 66 year old Male. Patient presenting from nursing  facility with reported 3 witnessed breakthrough seizures lasting ~30 seconds each. Given Versed en route to the ED with EMS. He was very somnolent on arrival to the ED. At ~2145 he suddenly woke up with confusion and endorsing severe anxiety. He was given 2 mg of Ativan at that time. No clinical seizure activity since arrival to the ED. He has been confused and difficult to arouse since ~2300. Currently on diltiazem gtt. Also given 1,000 mg of Keppra at ~2030.  Home AEDs: Vimpat 100 mg BID    Past Medical History:      Hypertension      Diabetes Mellitus      Hyperlipidemia      Atrial Fibrillation      Stroke      Seizures Other PMH:  ESRD s/p renal transplant, anxiety, CHF  Medications:  Anticoagulant use:  Yes Eliquis No Antiplatelet use Reviewed EMR for current medications  Allergies:  Reviewed  Social History: Unable To Obtain Due To Patient Status : Patient Is Obtunded/ Comatose  Family History:  There is no family history of premature cerebrovascular disease pertinent to this consultation  ROS : 14 Points Review of Systems was performed and was negative except mentioned in HPI. ROS Cannot Be Obtained Because:  Patient Is Obtunded/ Comatose  Past Surgical History: There Is No Surgical History Contributory To Todays Visit    Examination: BP(124/95), Pulse(139), Blood Glucose(144) 1A: Level of Consciousness - Requires repeated stimulation to arouse + 2 1B: Ask Month and Age - Could Not Answer Either Question Correctly + 2 1C: Blink Eyes & Squeeze Hands - Performs 0 Tasks + 2 2: Test Horizontal Extraocular Movements - Normal + 0 3: Test Visual Fields - No Visual Loss + 0 4: Test Facial Palsy (Use Grimace if Obtunded) - Normal symmetry + 0 5A: Test Left Arm  Motor Drift - Drift, hits bed + 2 5B: Test Right Arm Motor Drift - Drift, but doesn't hit bed + 1 6A: Test Left Leg Motor Drift - No Effort Against Gravity + 3 6B: Test Right Leg Motor Drift - Some Effort  Against Gravity + 2 7: Test Limb Ataxia (FNF/Heel-Shin) - No Ataxia + 0 8: Test Sensation - Normal; No sensory loss + 0 9: Test Language/Aphasia - Severe Aphasia: Fragmentary Expression, Inference Needed, Cannot Identify Materials + 2 10: Test Dysarthria - Mild-Moderate Dysarthria: Slurring but can be understood + 1 11: Test Extinction/Inattention - No abnormality + 0  NIHSS Score: 17     Patient / Family was informed the Neurology Consult would occur via TeleHealth consult by way of interactive audio and video telecommunications and consented to receiving care in this manner.  Patient is being evaluated for possible acute neurologic impairment and high probability of imminent or life - threatening deterioration.I spent total of 35 minutes providing care to this patient, including time for face to face visit via telemedicine, review of medical records, imaging studies and discussion of findings with providers, the patient and / or family.   Dr Janeece Agee   [facility] [phone]  Case [id number]

## 2021-01-12 LAB — BASIC METABOLIC PANEL
Anion gap: 13 (ref 5–15)
BUN: 77 mg/dL — ABNORMAL HIGH (ref 8–23)
CO2: 23 mmol/L (ref 22–32)
Calcium: 8.8 mg/dL — ABNORMAL LOW (ref 8.9–10.3)
Chloride: 105 mmol/L (ref 98–111)
Creatinine, Ser: 3.01 mg/dL — ABNORMAL HIGH (ref 0.61–1.24)
GFR, Estimated: 22 mL/min — ABNORMAL LOW (ref >60)
Glucose, Bld: 124 mg/dL — ABNORMAL HIGH (ref 70–99)
Potassium: 3.6 mmol/L (ref 3.5–5.1)
Sodium: 141 mmol/L (ref 135–145)

## 2021-01-12 LAB — CBG MONITORING, ED
Glucose-Capillary: 107 mg/dL — ABNORMAL HIGH (ref 70–99)
Glucose-Capillary: 117 mg/dL — ABNORMAL HIGH (ref 70–99)
Glucose-Capillary: 110 mg/dL — ABNORMAL HIGH (ref 70–99)
Glucose-Capillary: 128 mg/dL — ABNORMAL HIGH (ref 70–99)

## 2021-01-12 LAB — CBC
HCT: 25.7 % — ABNORMAL LOW (ref 39.0–52.0)
Hemoglobin: 8.1 g/dL — ABNORMAL LOW (ref 13.0–17.0)
MCH: 24 pg — ABNORMAL LOW (ref 26.0–34.0)
MCHC: 31.5 g/dL (ref 30.0–36.0)
MCV: 76.3 fL — ABNORMAL LOW (ref 80.0–100.0)
Platelets: 179 10*3/uL (ref 150–400)
RBC: 3.37 MIL/uL — ABNORMAL LOW (ref 4.22–5.81)
RDW: 25.6 % — ABNORMAL HIGH (ref 11.5–15.5)
WBC: 6.9 10*3/uL (ref 4.0–10.5)
nRBC: 0 % (ref 0.0–0.2)

## 2021-01-12 LAB — PHOSPHORUS: Phosphorus: 4.5 mg/dL (ref 2.5–4.6)

## 2021-01-12 LAB — MAGNESIUM: Magnesium: 1.9 mg/dL (ref 1.7–2.4)

## 2021-01-12 NOTE — ED Notes (Signed)
This pt. Sat up in bed, repositioned for comfort. Lunch cut into bite size pieces, pt. Demonstrated to this RN that he can feed himself. Requests more water. Water to bedside. Suction canister emptied, 900,mL cloudy dark yellow urine. Pt. Denies any further need at this time. Speaking in complete sentences, NAD.

## 2021-01-12 NOTE — Progress Notes (Signed)
He has had no further seizures since admission.  His asterixis is also improved today, I asked pharmacy to look at his medications and they did not find any likely culprits.  I suspect that this was related to his BUN/AKI and he has somewhat equilibrated to it.   At this time, I would continue Vimpat 150 mg twice daily at discharge.  Neurology will be available on an as-needed basis, please call with further questions or concerns.  Roland Rack, MD [facility] [phone]  If 7pm- 7am, please page neurology on call as listed in [place].

## 2021-01-12 NOTE — Progress Notes (Signed)
[facility] Progress Note  Patient: Jared Gregory    GEZ:[id number]  DOA: 01/10/2021     Date of Service: the patient was seen and examined on 01/12/2021  Chief Complaint  Patient presents with   Seizures    Pt from [facility] had 3 witnessed seizures ranging from 30 seconds to 1 minute. Pt has hx of epilepsy. Given 2 of versed by EMS. Post ictal on arrival   Brief hospital course: Jared Gregory is a 66 y.o. male with medical history significant for anxiety, depression, hyperlipidemia, atrial fibrillation on Coreg and Eliquis, cva in January 03, 2020, history of seizures on lacosamide, ESRD status post kidney transplant about 5 years ago in [place], who presents emergency department for chief concerns of seizure activity from facility. ED Course: Discussed with emergency medicine provider, patient requiring hospitalization for chief concerns of seizure.  Vitals in the emergency department showed temperature of 97.4, respiration rate of 20, heart rate of 96, initial blood pressure 194/112, improved to 181/106, SPO2 100% on room air.  Serum sodium 139, potassium 4.4, chloride 103, bicarb 26, BUN is 18, serum creatinine 3.17, nonfasting blood glucose 169, WBC 5.5, hemoglobin 7.3, platelets of 156.  GFR of 21.  Troponin was 40.      Assessment and Plan:   Principal Problem:   Seizure ([place]) Active Problems:   Depression   Essential hypertension   On continuous oral anticoagulation   BPH (benign prostatic hyperplasia)   # Seizure activity # Increased somnolence, Post ictal and due to ativan  - Etiology work-up in progress, Patient is status post Keppra 1 g IV - Increased Lacosamide 150 mg IV twice daily as per neurology, will transition to oral after improvement. - Ativan injection 2 mg IV as needed for breakthrough seizures, 3 doses ordered with instructions for nursing staff to alert provider once IV Ativan has been used - continue aspiration, fall precautions, seizure  precautions EEG negative for epilepsy Follow neurology for further recommendation and clearance for discharge   # Elevated troponin - etiology unclear at this time - Troponin is downtrending which is reassuring - Complete echo ordered due to patient's HR has been difficult to control ranging from 110-150 - Patient is sleeping and difficult to arouse - BNP 1828 elevated, but clinically euvolemic, avoided Lasix due to AKI  Follow TTE LVEF 6065%, small circumferential pericardial effusion, moderate MS, severe mitral valve calcification.  Patient was recommended to follow with cardiology as an outpatient to repeat 2D echocardiogram after few months  # History of cva in December of 2021 per spouse - MRI of the brain No acute infarct, hemorrhage, or mass. Continue Lipitor and patient is on Eliquis  # AKI on CKD stage IV, baseline creatinine 2.69 in sep 2022 - etiology work up in process  # History of renal transplant-resumed home cyclosporine 75 mg twice daily, mycophenolate 180 mg p.o. twice daily Check bladder scan to rule out urine retention Started IV fluid for gentle hydration as patient has decreased oral intake due to somnolence Check creatinine level tomorrow a.m. Consider nephrology consult if no improvement in the renal functions    # Depression/anxiety-resume duloxetine 60 mg daily  #HTN and h/o atrial fibrillation-resumed Coreg 50 mg twice daily, apixaban 5 mg p.o. twice daily starting on 01/11/2021 Patient had A. fib with RVR, Cardizem drip was started.   BPH-Flomax resumed   Hypomagnesemia, mag repleted.   Body mass index is 24.81 kg/m.  Interventions:   Follow PT and OT eval for  disposition plan    Diet: Renal diet DVT Prophylaxis: Therapeutic Anticoagulation with Eliquis    Advance goals of care discussion: Full code  Family Communication: family was NOT present at bedside, at the time of interview.  The pt provided permission to discuss medical plan with the  family. Opportunity was given to ask question and all questions were answered satisfactorily.   Disposition:  Pt is from Home, admitted with seizure, gradually improving, still has high blood pressure, and on IV Vimpat which precludes a safe discharge. Discharge to SNF, pending PT and OT eval, when clinically stable, needs clearance by neurology..  Subjective: No significant overnight events, patient is AO x3 today, stated that he is not ambulatory since past 6 months due to prior CVA.  Patient denied any headache or dizziness, patient was having some jerking movements of the right lower extremity, stated that his left lower extremity is weak due to prior stroke and cannot walk.  Patient denied any other active issues no chest pain or palpitation, no shortness of breath.   Physical Exam: General:  obtunded not oriented to time, place, and person.  Appear in no distress, affect unresponsive Eyes: closed, briefly opened, dozed off, postictal ENT: Oral Mucosa Clear, moist  Neck: no JVD,  Cardiovascular: S1 and S2 Present, no Murmur,  Respiratory: good respiratory effort, Bilateral Air entry equal and Decreased, no Crackles, no wheezes Abdomen: Bowel Sound present, Soft and no tenderness,  Skin: no rashes Extremities: no Pedal edema, no calf tenderness Neurologic: Bilateral lower extremity weakness, left greater than right, power 4/5, no sensory deficit. Gait not checked due to patient safety concerns  Vitals:   01/12/21 1246 01/12/21 1400 01/12/21 1415 01/12/21 1445  BP:  (!) 142/88    Pulse:  70 70 69  Resp:  16 16 12   Temp: 97.8 F (36.6 C)     TempSrc: Oral     SpO2:  97% 97% 96%  Weight:      Height:        Intake/Output Summary (Last 24 hours) at 01/12/2021 1634 Last data filed at 01/12/2021 1005 Gross per 24 hour  Intake 77.32 ml  Output 450 ml  Net -372.68 ml   Filed Weights   01/10/21 1926  Weight: 76.2 kg    Data Reviewed: I have personally reviewed and interpreted  daily labs, tele strips, imagings as discussed above. I reviewed all nursing notes, pharmacy notes, vitals, pertinent old records I have discussed plan of care as described above with RN and patient/family.  CBC: Recent Labs  Lab 01/10/21 2020 01/12/21 0953  WBC 5.5 6.9  HGB 7.3* 8.1*  HCT 24.1* 25.7*  MCV 76.8* 76.3*  PLT 156 812   Basic Metabolic Panel: Recent Labs  Lab 01/10/21 2020 15-Jan-2021 0111 01/12/21 0953  NA 139  --  141  K 4.4  --  3.6  CL 103  --  105  CO2 26  --  23  GLUCOSE 169*  --  124*  BUN 73*  --  77*  CREATININE 3.17*  --  3.01*  CALCIUM 8.7*  --  8.8*  MG  --  1.5* 1.9  PHOS  --  4.4 4.5    Studies: EEG adult  Result Date: 01/15/2021 Greta Doom, MD     2021/01/15  6:37 PM History: 66 yo M with a history of stroke presenting with seizure Sedation: None Technique: This EEG was acquired with electrodes placed according to the International 10-20 electrode system (including  Fp1, Fp2, F3, F4, C3, C4, P3, P4, O1, O2, T3, T4, T5, T6, A1, A2, Fz, Cz, Pz). The following electrodes were missing or displaced: none. Background: The background consists of intermixed alpha and beta activities. There is a well defined posterior dominant rhythm of 8 Hz that attenuates with eye opening. Sleep is recorded with normal appearing structures. Photic stimulation: Physiologic driving is not performed EEG Abnormalities: none Clinical Interpretation: This normal EEG is recorded in the waking and sleep state. There was no seizure or seizure predisposition recorded on this study. Please note that lack of epileptiform activity on EEG does not preclude the possibility of epilepsy. Roland Rack, MD [facility] [phone] If 7pm- 7am, please page neurology on call as listed in [place].    Scheduled Meds:  apixaban  5 mg Oral BID   atorvastatin  40 mg Oral QHS   carvedilol  50 mg Oral BID WC   cholecalciferol  2,000 Units Oral Daily   cycloSPORINE  75 mg Oral  BID   DULoxetine  60 mg Oral Daily   insulin aspart  0-5 Units Subcutaneous QHS   insulin aspart  0-9 Units Subcutaneous TID WC   linagliptin  5 mg Oral Daily   losartan  50 mg Oral Daily   mycophenolate  180 mg Oral BID   tamsulosin  0.8 mg Oral QPC supper   Continuous Infusions:  sodium chloride 75 mL/hr at 01/12/21 0933   diltiazem (CARDIZEM) infusion Stopped (01/11/21 0810)   lacosamide (VIMPAT) IV Stopped (01/12/21 1005)   PRN Meds: acetaminophen **OR** acetaminophen, diltiazem, hydrALAZINE, LORazepam, metoprolol tartrate, ondansetron **OR** ondansetron (ZOFRAN) IV  Time spent: 35 minutes  Author: Val Riles. MD [facility] 01/12/2021 4:34 PM  To reach On-call, see care teams to locate the attending and reach out to them via [email or web address]. If 7PM-7AM, please contact night-coverage If you still have difficulty reaching the attending provider, please page the [facility] (Director on Call) for [facility] on amion for assistance.

## 2021-01-12 NOTE — ED Notes (Signed)
Pt. Ate 25% of lunch tray

## 2021-01-13 ENCOUNTER — Encounter: Payer: Self-pay | Admitting: Internal Medicine

## 2021-01-13 ENCOUNTER — Inpatient Hospital Stay: Payer: No Typology Code available for payment source

## 2021-01-13 LAB — URINALYSIS, COMPLETE (UACMP) WITH MICROSCOPIC
Bilirubin Urine: NEGATIVE
Glucose, UA: NEGATIVE mg/dL
Ketones, ur: NEGATIVE mg/dL
Nitrite: NEGATIVE
Protein, ur: >=300 mg/dL — AB
RBC / HPF: >50 RBC/hpf — ABNORMAL HIGH (ref 0–5)
Specific Gravity, Urine: 1.016 (ref 1.005–1.030)
WBC, UA: >50 WBC/hpf — ABNORMAL HIGH (ref 0–5)
pH: 5 (ref 5.0–8.0)

## 2021-01-13 LAB — BASIC METABOLIC PANEL
Anion gap: 11 (ref 5–15)
BUN: 71 mg/dL — ABNORMAL HIGH (ref 8–23)
CO2: 21 mmol/L — ABNORMAL LOW (ref 22–32)
Calcium: 8.6 mg/dL — ABNORMAL LOW (ref 8.9–10.3)
Chloride: 105 mmol/L (ref 98–111)
Creatinine, Ser: 2.82 mg/dL — ABNORMAL HIGH (ref 0.61–1.24)
GFR, Estimated: 24 mL/min — ABNORMAL LOW (ref >60)
Glucose, Bld: 134 mg/dL — ABNORMAL HIGH (ref 70–99)
Potassium: 3.6 mmol/L (ref 3.5–5.1)
Sodium: 137 mmol/L (ref 135–145)

## 2021-01-13 LAB — CBC
HCT: 25.9 % — ABNORMAL LOW (ref 39.0–52.0)
Hemoglobin: 8.3 g/dL — ABNORMAL LOW (ref 13.0–17.0)
MCH: 24.1 pg — ABNORMAL LOW (ref 26.0–34.0)
MCHC: 32 g/dL (ref 30.0–36.0)
MCV: 75.1 fL — ABNORMAL LOW (ref 80.0–100.0)
Platelets: 207 10*3/uL (ref 150–400)
RBC: 3.45 MIL/uL — ABNORMAL LOW (ref 4.22–5.81)
RDW: 25 % — ABNORMAL HIGH (ref 11.5–15.5)
WBC: 7.7 10*3/uL (ref 4.0–10.5)
nRBC: 0 % (ref 0.0–0.2)

## 2021-01-13 LAB — PHOSPHORUS: Phosphorus: 4.1 mg/dL (ref 2.5–4.6)

## 2021-01-13 LAB — RESPIRATORY PANEL BY PCR

## 2021-01-13 LAB — MAGNESIUM: Magnesium: 1.7 mg/dL (ref 1.7–2.4)

## 2021-01-13 LAB — BLOOD GAS, ARTERIAL
Acid-base deficit: 3.7 mmol/L — ABNORMAL HIGH (ref 0.0–2.0)
Bicarbonate: 19.9 mmol/L — ABNORMAL LOW (ref 20.0–28.0)
FIO2: 0.28
O2 Saturation: 96.5 %
Patient temperature: 37
pCO2 arterial: 30 mmHg — ABNORMAL LOW (ref 32.0–48.0)
pH, Arterial: 7.43 (ref 7.350–7.450)
pO2, Arterial: 83 mmHg (ref 83.0–108.0)

## 2021-01-13 LAB — CBG MONITORING, ED
Glucose-Capillary: 123 mg/dL — ABNORMAL HIGH (ref 70–99)
Glucose-Capillary: 127 mg/dL — ABNORMAL HIGH (ref 70–99)
Glucose-Capillary: 165 mg/dL — ABNORMAL HIGH (ref 70–99)

## 2021-01-13 MED ORDER — METHYLPREDNISOLONE SODIUM SUCC 40 MG IJ SOLR: 40.0000 mg | Freq: Two times a day (BID) | INTRAMUSCULAR | Status: DC

## 2021-01-13 MED ORDER — IPRATROPIUM-ALBUTEROL 0.5-2.5 (3) MG/3ML IN SOLN
3.0000 mL | Freq: Four times a day (QID) | RESPIRATORY_TRACT | Status: DC
  Administered 2021-01-13 – 2021-01-14 (×6): 3 mL via RESPIRATORY_TRACT
  Filled 2021-01-13 (×5): qty 3

## 2021-01-13 MED ORDER — FLUTICASONE FUROATE-VILANTEROL 200-25 MCG/ACT IN AEPB
1.0000 | INHALATION_SPRAY | Freq: Every day | RESPIRATORY_TRACT | Status: DC
  Administered 2021-01-15 – 2021-01-19 (×5): 1 via RESPIRATORY_TRACT
  Filled 2021-01-13 (×3): qty 28

## 2021-01-13 MED ORDER — METHYLPREDNISOLONE SODIUM SUCC 40 MG IJ SOLR: 40.0000 mg | Freq: Three times a day (TID) | INTRAMUSCULAR | Status: DC

## 2021-01-13 MED ORDER — HYDROXYZINE HCL 25 MG PO TABS
25.0000 mg | ORAL_TABLET | Freq: Three times a day (TID) | ORAL | Status: DC
  Administered 2021-01-13 – 2021-01-15 (×5): 25 mg via ORAL
  Filled 2021-01-13 (×7): qty 1

## 2021-01-13 MED ORDER — NICARDIPINE HCL IN NACL 20-0.86 MG/200ML-% IV SOLN
3.0000 mg/h | INTRAVENOUS | Status: DC
  Administered 2021-01-13 – 2021-01-15 (×7): 3 mg/h via INTRAVENOUS
  Filled 2021-01-13 (×9): qty 200

## 2021-01-13 MED ORDER — SODIUM CHLORIDE 0.9 % IV SOLN
500.0000 mg | INTRAVENOUS | Status: DC
  Administered 2021-01-13 – 2021-01-14 (×2): 500 mg via INTRAVENOUS
  Filled 2021-01-13: qty 500
  Filled 2021-01-13 (×3): qty 5

## 2021-01-13 MED ORDER — LORAZEPAM 2 MG/ML IJ SOLN
INTRAMUSCULAR | Status: AC
  Filled 2021-01-13: qty 1

## 2021-01-13 MED ORDER — LORAZEPAM 2 MG/ML IJ SOLN
2.0000 mg | Freq: Four times a day (QID) | INTRAMUSCULAR | Status: DC | PRN
  Administered 2021-01-13: 2 mg via INTRAVENOUS

## 2021-01-13 MED ORDER — METHYLPREDNISOLONE SODIUM SUCC 125 MG IJ SOLR
125.0000 mg | Freq: Once | INTRAMUSCULAR | Status: AC
  Administered 2021-01-13: 125 mg via INTRAVENOUS
  Filled 2021-01-13: qty 2

## 2021-01-13 MED ORDER — HALOPERIDOL LACTATE 5 MG/ML IJ SOLN
2.0000 mg | Freq: Four times a day (QID) | INTRAMUSCULAR | Status: DC | PRN
  Administered 2021-01-13: 2 mg via INTRAVENOUS
  Filled 2021-01-13: qty 1

## 2021-01-13 MED ORDER — LORAZEPAM 2 MG/ML IJ SOLN: 2.0000 mg | INTRAMUSCULAR | Status: DC | PRN

## 2021-01-13 MED ORDER — METHYLPREDNISOLONE SODIUM SUCC 125 MG IJ SOLR
120.0000 mg | INTRAMUSCULAR | Status: DC
  Administered 2021-01-14: 120 mg via INTRAVENOUS
  Filled 2021-01-13: qty 2

## 2021-01-13 MED ORDER — SODIUM CHLORIDE 0.9 % IV SOLN
1.0000 g | INTRAVENOUS | Status: DC
  Administered 2021-01-13 – 2021-01-16 (×4): 1 g via INTRAVENOUS
  Filled 2021-01-13: qty 10
  Filled 2021-01-13 (×2): qty 1
  Filled 2021-01-13: qty 10
  Filled 2021-01-13: qty 1
  Filled 2021-01-13: qty 10

## 2021-01-13 NOTE — ED Notes (Signed)
ABG drawn and pt placed on bipap by RT.

## 2021-01-13 NOTE — Progress Notes (Signed)
OT Cancellation Note  Patient Details Name: Jared Gregory MRN: [id number] DOB: 29-Jul-1955   Cancelled Treatment:    Reason Eval/Treat Not Completed: Medical issues which prohibited therapy. Per chart review, pt now agitated and RN requesting IV sedation. Pt also placed on Bipap. OT to re-attempt when pt is able to actively participate in therapeutic intervention.   Darleen Crocker, MS, OTR/L , CBIS ascom [phone]  01/13/21, 1:33 PM

## 2021-01-13 NOTE — ED Notes (Signed)
Informed attending of pt status. Labored breathing, hypertensive, and confused and agitated.

## 2021-01-13 NOTE — ED Notes (Signed)
At this moment, pt tolerating bipap.

## 2021-01-13 NOTE — ED Notes (Signed)
Informed RT that pt was taken off bipap.

## 2021-01-13 NOTE — ED Notes (Signed)
Nicardipine started at 3mg /hr.  Pt now going to CT.  Pt had removed nasal cannula and was satting at 83%. Replaced [place]. Now 94% on 2L.

## 2021-01-13 NOTE — Progress Notes (Addendum)
[facility] Progress Note  Patient: Jared Gregory    MWN:[id number]  DOA: 01/10/2021     Date of Service: the patient was seen and examined on 01/13/2021  Chief Complaint  Patient presents with   Seizures    Pt from [facility] had 3 witnessed seizures ranging from 30 seconds to 1 minute. Pt has hx of epilepsy. Given 2 of versed by EMS. Post ictal on arrival   Brief hospital course: Jared Gregory is a 66 y.o. male with medical history significant for anxiety, depression, hyperlipidemia, atrial fibrillation on Coreg and Eliquis, cva in January 03, 2020, history of seizures on lacosamide, ESRD status post kidney transplant about 5 years ago in [place], who presents emergency department for chief concerns of seizure activity from facility. ED Course: Discussed with emergency medicine provider, patient requiring hospitalization for chief concerns of seizure.  Vitals in the emergency department showed temperature of 97.4, respiration rate of 20, heart rate of 96, initial blood pressure 194/112, improved to 181/106, SPO2 100% on room air.  Serum sodium 139, potassium 4.4, chloride 103, bicarb 26, BUN is 18, serum creatinine 3.17, nonfasting blood glucose 169, WBC 5.5, hemoglobin 7.3, platelets of 156.  GFR of 21.  Troponin was 40.      Assessment and Plan:   Principal Problem:   Seizure ([place]) Active Problems:   Depression   Essential hypertension   On continuous oral anticoagulation   BPH (benign prostatic hyperplasia)   # Seizure activity # Increased somnolence, Post ictal and due to ativan  - Etiology work-up in progress, Patient is status post Keppra 1 g IV - Increased Lacosamide 150 mg IV twice daily as per neurology, will transition to oral after improvement. - Ativan injection 2 mg IV as needed for breakthrough seizures, 3 doses ordered with instructions for nursing staff to alert provider once IV Ativan has been used - continue aspiration, fall precautions, seizure  precautions EEG negative for epilepsy Follow neurology for further recommendation and clearance for discharge   Acute respiratory failure secondary to community-acquired pneumonia Continue supplemental O2 inhalation and gradually wean off Started ceftriaxone and azithromycin CT chest Multiple patchy airspace opacities are noted in both upper lobes concerning for multifocal pneumonia. Small bilateral pleural effusions are noted with adjacent subsegmental atelectasis. Extensive coronary artery calcifications are noted suggesting coronary artery disease. Follow respiratory viral panel Follow urine antigen for strep and Legionella Follow blood cultures  COPD exacerbation secondary to underlying pneumonia Started Solu-Medrol 125 mg x 1 dose Started DuoNeb every 6 hourly and Breo Ellipta inhaler daily Pulmonary critical care consulted for further management Follow ABG New BiPAP as needed  Hypertensive urgency, blood pressure remained uncontrolled Continued home medications Coreg 50 mg p.o. twice daily, losartan 50 mg daily 1/6 started Cardene IV infusion  UTI, UA positive, continue ceftriaxone Follow urine culture   # Elevated troponin - etiology unclear at this time - Troponin is downtrending which is reassuring - Complete echo ordered due to patient's HR has been difficult to control ranging from 110-150 - Patient is sleeping and difficult to arouse - BNP 1828 elevated, but clinically euvolemic, avoided Lasix due to AKI  Follow TTE LVEF 6065%, small circumferential pericardial effusion, moderate MS, severe mitral valve calcification.  Patient was recommended to follow with cardiology as an outpatient to repeat 2D echocardiogram after few months  # History of cva in December of 2021 per spouse - MRI of the brain No acute infarct, hemorrhage, or mass. Continue Lipitor and patient is on  Eliquis  # AKI on CKD stage IV, baseline creatinine 2.69 in sep 2022 - etiology work up in process   # History of renal transplant-resumed home cyclosporine 75 mg twice daily, mycophenolate 180 mg p.o. twice daily Check bladder scan to rule out urine retention Started IV fluid for gentle hydration as patient has decreased oral intake due to somnolence Check creatinine level tomorrow a.m. Consider nephrology consult if no improvement in the renal functions    Depression/anxiety-resume duloxetine 60 mg daily Started Atarax 25 mg p.o. 3 times daily due to worsening of anxiety  #Chronic persistent atrial fibrillation-resumed Coreg 50 mg twice daily, apixaban 5 mg p.o. twice daily starting on 01/11/2021 Patient had A. fib with RVR, resolved s/p Cardizem drip, which was discontinued   BPH-Flomax resumed   Hypomagnesemia, mag repleted.   Body mass index is 24.81 kg/m.  Interventions:   Follow PT and OT eval for disposition plan    Diet: Renal diet DVT Prophylaxis: Therapeutic Anticoagulation with Eliquis    Advance goals of care discussion: Full code  Family Communication: family was NOT present at bedside, at the time of interview.  The pt provided permission to discuss medical plan with the family. Opportunity was given to ask question and all questions were answered satisfactorily.   Disposition:  Pt is from Home, admitted with seizure, gradually improving, still has high blood pressure, and on IV Vimpat which precludes a safe discharge. Discharge to SNF, pending PT and OT eval, when clinically stable, needs clearance by neurology..  Subjective: No significant overnight events, today morning patient was very short of breath and in distress, patient was AO x3, patient was having productive cough and intermittent confusion.  Patient denied any chest pain or palpitations, no any other active issues.    Physical Exam: General:  obtunded not oriented to time, place, and person.  Appear in no distress, affect unresponsive Eyes: closed, briefly opened, dozed off, postictal ENT:  Oral Mucosa Clear, moist  Neck: no JVD,  Cardiovascular: S1 and S2 Present, no Murmur,  Respiratory: good respiratory effort, Bilateral Air entry equal and Decreased, no Crackles, no wheezes Abdomen: Bowel Sound present, Soft and no tenderness,  Skin: no rashes Extremities: no Pedal edema, no calf tenderness Neurologic: Bilateral lower extremity weakness, left greater than right, power 4/5, no sensory deficit. Gait not checked due to patient safety concerns  Vitals:   01/13/21 1430 01/13/21 1600 01/13/21 1630 01/13/21 1700  BP: 133/85 127/77 132/90 (!) 139/95  Pulse: 79 73 71 78  Resp: (!) 25  (!) 21 (!) 27  Temp:      TempSrc:      SpO2: 96% 94% 97% 97%  Weight:      Height:        Intake/Output Summary (Last 24 hours) at 01/13/2021 1739 Last data filed at 01/13/2021 1447 Gross per 24 hour  Intake 1120.5 ml  Output 700 ml  Net 420.5 ml   Filed Weights   01/10/21 1926  Weight: 76.2 kg    Data Reviewed: I have personally reviewed and interpreted daily labs, tele strips, imagings as discussed above. I reviewed all nursing notes, pharmacy notes, vitals, pertinent old records I have discussed plan of care as described above with RN and patient/family.  CBC: Recent Labs  Lab 01/10/21 2020 01/12/21 0953 01/13/21 1105  WBC 5.5 6.9 7.7  HGB 7.3* 8.1* 8.3*  HCT 24.1* 25.7* 25.9*  MCV 76.8* 76.3* 75.1*  PLT 156 179 778   Basic Metabolic Panel:  Recent Labs  Lab 01/10/21 2020 01/11/21 0111 01/12/21 0953 01/13/21 1105  NA 139  --  141 137  K 4.4  --  3.6 3.6  CL 103  --  105 105  CO2 26  --  23 21*  GLUCOSE 169*  --  124* 134*  BUN 73*  --  77* 71*  CREATININE 3.17*  --  3.01* 2.82*  CALCIUM 8.7*  --  8.8* 8.6*  MG  --  1.5* 1.9 1.7  PHOS  --  4.4 4.5 4.1    Studies: CT HEAD WO CONTRAST (5MM)  Result Date: 01/13/2021 CLINICAL DATA:  Altered mental status, nontraumatic. EXAM: CT HEAD WITHOUT CONTRAST TECHNIQUE: Contiguous axial images were obtained from the base  of the skull through the vertex without intravenous contrast. COMPARISON:  CT examination dated January 3 and MRI examination dated January 11, 2021 FINDINGS: Brain: No evidence of acute infarction, hemorrhage, hydrocephalus, extra-axial collection or mass lesion/mass effect. Lacunar infarct of the right thalamus and left basal ganglia are unchanged. Diffuse low-attenuation of the periventricular white matter presumed chronic microvascular ischemic changes. Vascular: No hyperdense vessel or unexpected calcification. Skull: Normal. Negative for fracture or focal lesion. Sinuses/Orbits: No acute finding. Other: None. IMPRESSION: 1. No acute intracranial abnormality. 2. Stable chronic findings as detailed above. Electronically Signed   By: Keane Police D.O.   On: 01/13/2021 10:51   CT CHEST WO CONTRAST  Result Date: 01/13/2021 CLINICAL DATA:  Shortness of breath. EXAM: CT CHEST WITHOUT CONTRAST TECHNIQUE: Multidetector CT imaging of the chest was performed following the standard protocol without IV contrast. COMPARISON:  None. FINDINGS: Cardiovascular: Atherosclerosis of thoracic aorta is noted without aneurysm formation. Mild cardiomegaly is noted. No pericardial effusion is noted. Mitral valve calcifications are noted. Extensive coronary calcifications are noted. Mediastinum/Nodes: No enlarged mediastinal or axillary lymph nodes. Thyroid gland, trachea, and esophagus demonstrate no significant findings. Lungs/Pleura: No pneumothorax is noted. Multiple patchy airspace opacities are noted in both upper lobes concerning for multifocal pneumonia. Small bilateral pleural effusions are noted with adjacent subsegmental atelectasis. Upper Abdomen: No acute abnormality. Musculoskeletal: No chest wall mass or suspicious bone lesions identified. IMPRESSION: Multiple patchy airspace opacities are noted in both upper lobes concerning for multifocal pneumonia. Small bilateral pleural effusions are noted with adjacent  subsegmental atelectasis. Extensive coronary artery calcifications are noted suggesting coronary artery disease. Aortic Atherosclerosis (ICD10-I70.0). Electronically Signed   By: Marijo Conception M.D.   On: 01/13/2021 10:38    Scheduled Meds:  apixaban  5 mg Oral BID   atorvastatin  40 mg Oral QHS   carvedilol  50 mg Oral BID WC   cholecalciferol  2,000 Units Oral Daily   cycloSPORINE  75 mg Oral BID   DULoxetine  60 mg Oral Daily   fluticasone furoate-vilanterol  1 puff Inhalation Daily   hydrOXYzine  25 mg Oral TID   insulin aspart  0-5 Units Subcutaneous QHS   insulin aspart  0-9 Units Subcutaneous TID WC   ipratropium-albuterol  3 mL Nebulization Q6H   linagliptin  5 mg Oral Daily   losartan  50 mg Oral Daily   [START ON 01/14/2021] methylPREDNISolone (SOLU-MEDROL) injection  120 mg Intravenous Q24H   mycophenolate  180 mg Oral BID   tamsulosin  0.8 mg Oral QPC supper   Continuous Infusions:  sodium chloride 75 mL/hr at 01/13/21 1222   azithromycin Stopped (01/13/21 1447)   cefTRIAXone (ROCEPHIN)  IV Stopped (01/13/21 1303)   lacosamide (VIMPAT) IV Stopped (01/13/21 0845)  niCARDipine 3 mg/hr (01/13/21 0958)   PRN Meds: acetaminophen **OR** acetaminophen, diltiazem, haloperidol lactate, hydrALAZINE, LORazepam, metoprolol tartrate, ondansetron **OR** ondansetron (ZOFRAN) IV  Time spent: 35 minutes  Author: Val Riles. MD [facility] 01/13/2021 5:39 PM  To reach On-call, see care teams to locate the attending and reach out to them via [email or web address]. If 7PM-7AM, please contact night-coverage If you still have difficulty reaching the attending provider, please page the [facility] (Director on Call) for [facility] on amion for assistance.

## 2021-01-13 NOTE — Progress Notes (Signed)
PT Cancellation Note  Patient Details Name: Jared Gregory MRN: [id number] DOB: May 05, 1955   Cancelled Treatment:    Reason Eval/Treat Not Completed: Patient not medically ready.  PT consult received.  Chart reviewed.  Pt's most recent BP at 0800 was 215/108.  Per chart review, pt's BP has been elevated.  Discussed BP with pt's nurse.  D/t elevated BP, will hold PT at this time and re-attempt PT evaluation at a later date/time as medically appropriate.  Leitha Bleak, PT 01/13/21, 8:33 AM

## 2021-01-13 NOTE — ED Notes (Signed)
Pt removed bipap mask from self. Placed mask back on pt and reminded to leave on. Pt continues to be very confused.

## 2021-01-13 NOTE — ED Notes (Signed)
CBG 123 

## 2021-01-13 NOTE — ED Notes (Signed)
Pt repositioned in bed. ICU MD and NP were consulted to come evaluate pt who continues to be agitated.   Pt is slightly more comfortable now than a few minutes ago.

## 2021-01-13 NOTE — ED Notes (Signed)
Pts wife called and provided an update.

## 2021-01-13 NOTE — ED Notes (Signed)
Pt is slightly agitated and confused. Unable to give PO meds at this time.

## 2021-01-13 NOTE — ED Notes (Signed)
Pt still trying to rip off bipap. Cannot leave bedside of pt at this time. Have messaged Mds and ICU MD will evaluate pt at bedside to consider IV sedation or other options.

## 2021-01-13 NOTE — Progress Notes (Signed)
OT Cancellation Note  Patient Details Name: Jared Gregory MRN: [id number] DOB: 1955/05/11   Cancelled Treatment:    Reason Eval/Treat Not Completed: Medical issues which prohibited therapy. OT order received and chart reviewed.Pt's most recent BP at 0800 was 215/108.  Per chart review, pt's BP has been elevated.  OT will re-attempt as time allows and if pt is medically able to participate.   Darleen Crocker, [place], OTR/L , CBIS ascom [phone]  01/13/21, 9:03 AM

## 2021-01-13 NOTE — Progress Notes (Signed)
RT called to room for pt increase WOB and ABG order. Pt in Resp Distress unable to speak in 2 word segments w/accessory muscle use. Pt placed on BiPAP 10/5, ABG drawn. MD/RN aware

## 2021-01-13 NOTE — ED Notes (Signed)
Pt trying to remove bipap mask again.  ICU NP at bedside evaluating pt. Pt is following commands.

## 2021-01-13 NOTE — ED Notes (Signed)
Pt now resting comfortably in bed. VS are now normal. Pt is not agitated as he was this morning.

## 2021-01-13 NOTE — ED Notes (Signed)
Pt very agitated. Messaged provider to request more IV sedation and to come see pt.

## 2021-01-13 NOTE — ED Notes (Signed)
Top dentures of pt placed in specimen bag with pt sticker and placed inside of pt belongings bag which also has pt sticker, at bedside. This bag also contains blanket and neclace of pt.

## 2021-01-13 NOTE — ED Notes (Signed)
ICU NP placed pt back on 2L  from bipap. Pt relatively calm at this moment.

## 2021-01-13 NOTE — ED Notes (Signed)
Helped pt drink water and eat several bites of applesauce.

## 2021-01-13 NOTE — ED Notes (Signed)
C/O being anxious.  Reassurance given, moderately effective.

## 2021-01-13 NOTE — Progress Notes (Signed)
PHARMACIST - PHYSICIAN COMMUNICATION   CONCERNING: Methylprednisolone IV    Current order: Methylprednisolone IV 40mg  every 8 hours      DESCRIPTION: Per [facility] Protocol:   IV methylprednisolone will be converted to either a q12h or q24h frequency with the same total daily dose (TDD).  Ordered Dose: 1 to 125 mg TDD; convert to: TDD q24h.  Ordered Dose: 126 to 250 mg TDD; convert to: TDD div q12h.  Ordered Dose: >250 mg TDD; DAW.  Order has been adjusted to: Methylprednisolone IV 120mg  every 24 hours   Pernell Dupre , PharmD, BCPS Clinical Pharmacist  01/13/2021 11:24 AM

## 2021-01-13 NOTE — ED Notes (Signed)
Called pharmacy to have IV nicardipine verified.

## 2021-01-13 NOTE — ED Notes (Signed)
Pt on way back from CT. Lab at bedside to draw AM labs.

## 2021-01-13 NOTE — Consult Note (Signed)
NAME:  Jared Gregory, MRN:  [id number], DOB:  Apr 14, 1955, LOS: 2 ADMISSION DATE:  01/10/2021, CONSULTATION DATE:  01/14/2020 REFERRING MD:  Dr. Dwyane Dee, CHIEF COMPLAINT:  Seizure, AMS, Acute Respiratory Distress   Brief Pt Description / Synopsis:  66 year old male admitted with concerns of seizure activity.  On 01/13/2021 developed worsening agitation, increased work of breathing requiring BiPAP.  Found to have multifocal pneumonia and UTI.  PCCM consulted for possible Precedex infusion.  History of Present Illness:  Jared Gregory is a 66 year old male with a past medical history significant for seizures on lacosamide, ESRD status post kidney transplants about 5 years ago in [place], atrial fibrillation on Coreg and Eliquis, CVA, anxiety, depression, hyperlipidemia who presented to [facility] ED on 01/10/21 from his facility due to chief concerns of seizure activity.  Patient denied missing any doses of his outpatient Vimpat, along with alcohol or drug use.  Patient is currently altered and on BiPAP and unable to contribute to history, therefore history is obtained from chart review..  ED course: Initial vital signs: Temperature 97.4, RR 20, pulse 96, blood pressure 194/112, SPO2 100% on room air Significant labs: BUN 18, creatinine 3.17, glucose 169, WBC 5.5, hemoglobin 7.3, platelets 156, high-sensitivity troponin 40 Imaging: Medications administered: Keppra 1 g IV  He was admitted by the hospitalist for further work-up and treatment of breakthrough seizures.  Neurology was consulted and increased of his Vimpat dose to 150 mg twice daily and recommended EEG.  EEG showed no seizure activity.  Interval history On 01/13/2021 the patient was noted to be coming increasingly agitated and restless, as well as with increased work of breathing and hypertension.  He was placed on BiPAP and nicardipine infusion.  Urinalysis is concerning for urinary tract infection, and CT chest shows multiple patchy airspace  opacities and both upper lobes concerning for multifocal pneumonia.  He was subsequently placed on azithromycin and ceftriaxone.  CT head repeated and shows no acute intracranial abnormality.  PCCM is asked to consult due to increased agitation possibly requiring Precedex drip.  Pertinent  Medical History  seizures on lacosamide ESRD status post kidney transplants about 5 years ago in [place] atrial fibrillation on Coreg and Eliquis CVA Anxiety Depression hyperlipidemia  Hypertension Diabetes Mellitus Vascular Dementia  Micro Data:  01/11/2021: 6 SARS-CoV-2 and influenza PCR>> negative 01/13/2021: Respiratory viral panel>> 01/13/2021: Blood culture x2>> 01/13/2021: Urine>> 01/13/2021: Strep pneumo urinary antigen>> 01/13/2021: Legionella urinary antigen>> 01/13/2021: Sputum>>  Antimicrobials:  Azithromycin 1/6>> Ceftriaxone 1/6>>  Significant Hospital Events: Including procedures, antibiotic start and stop dates in addition to other pertinent events   01/10/2021: Admitted by hospitalist for breakthrough seizures 01/11/2021: Evaluated by neurology, Vimpat dose increased.  EEG obtained without seizure activity 01/13/2021: Patient increasingly agitated and restless receiving multiple doses of Ativan.  Also noted to have increased work of breathing and hypertension of which she was placed on BiPAP and nicardipine drip.  PCCM asked to consult due to possible need for Precedex.  Interim History / Subjective:  -Patient with increased agitation and restlessness today requiring multiple doses of Ativan -Also with noted work of breathing and hypertension, was subsequent placed on BiPAP and nicardipine infusion -Found to have UTI and pneumonia ~started on azithromycin and ceftriaxone -PCCM asked to consult due to possible need for Precedex -Upon PCCM examination, patient comfortable and resting status post Ativan, removed from BiPAP and placed on 2 L nasal cannula and tolerating -Would recommend to  continue with as needed Ativan and as needed Haldol -Could  consider Precedex if PRN medications not effective  Objective   Blood pressure 129/83, pulse 80, temperature 97.8 F (36.6 C), temperature source Oral, resp. rate (!) 24, height 5\' 9"  (1.753 m), weight 76.2 kg, SpO2 99 %.        Intake/Output Summary (Last 24 hours) at 01/13/2021 1421 Last data filed at 01/13/2021 1303 Gross per 24 hour  Intake 1000.5 ml  Output 700 ml  Net 300.5 ml   Filed Weights   01/10/21 1926  Weight: 76.2 kg    Examination: General: Acutely ill-appearing male, sitting in bed, on BiPAP, no acute distress (calm after receiving Ativan approximately 15 minutes ago) HENT: Atraumatic, normocephalic, neck supple, no JVD Lungs: Coarse breath sounds bilaterally, mild expiratory wheeze, even, nonlabored Cardiovascular: Regular rate and rhythm, S1-S2, no murmurs, rubs, gallops Abdomen: Soft, nontender, nondistended, no guarding rebound tenderness, bowel sounds positive x4 Extremities: No deformities, no edema Neuro: Lethargic status post Ativan administration, arouses easily to voice and follows simple commands, no focal deficits noted, pupils PERRLA, unable to assess orientation due to altered mental status and BiPAP GU: Deferred  Resolved Hospital Problem list     Assessment & Plan:   Acute Hypoxic Respiratory Failure in the setting of Multifocal Pneumonia (? Aspiration) -Supplemental O2 as needed to maintain O2 sats >92% -BiPAP, wean as tolerated -Follow intermittent CXR & ABG as needed -ABX as above -Bronchodilators -Continue Breo Ellipta -Aggressive pulmonary hygiene/toilet -Aspiration precautions  Multifocal Pneumonia & UTI -Monitor fever curve -Trend WBC's & Procalcitonin -Follow cultures as above -Continue empiric Azithromycin & Ceftriaxone pending cultures & sensitivities  Essential Hypertension Mildly Elevated Troponin, suspect demand ischemia PMHx: Atrial fibrillation on  Apixaban -Continuous cardiac monitoring -Continue Coreg and Losartan -Nicardipine gtt as needed -HS Troponin peaked at 40 -Continue Eliquis -Echocardiogram 01/11/21: LVEF 60-65%, unable to evaluate diastolic parameters, RV function normal, normal pulmonary artery systolic pressure, small pericardial effusion, moderate mitral stenosis  Acute Metabolic Encephalopathy in the setting of UTI & Pneumonia & Seizures PMHx: Depression/Anxiety, seizures -Seizure precaution -Provide supportive care -Promote normal sleep/wake cycle -Treat infections as above -Avoid sedating meds as able -Prn Ativan and Haldol for severe agitation -CT Head negative for acute intracranial abnormality -MRI Brain negative for acute process -Urine drug screen positive for benzodiazepine -Continue Vimpat 150 mg twice daily -Seen by Neurology, has since signed off -EEG negative for seizures  AKI on CKD Stage IV PMHx: Renal Transplant -Monitor I&O's / urinary output -Follow BMP -Ensure adequate renal perfusion -Avoid nephrotoxic agents as able -Replace electrolytes as indicated -Continue cyclosporine and Mycophenolate -Consider nephrology consult   Best Practice (right click and "Reselect all SmartList Selections" daily)   Diet/type: Regular consistency (see orders) DVT prophylaxis: DOAC GI prophylaxis: N/A Lines: N/A Foley:  N/A Code Status:  full code Last date of multidisciplinary goals of care discussion [N/A]  Labs   CBC: Recent Labs  Lab 01/10/21 2020 01/12/21 0953 01/13/21 1105  WBC 5.5 6.9 7.7  HGB 7.3* 8.1* 8.3*  HCT 24.1* 25.7* 25.9*  MCV 76.8* 76.3* 75.1*  PLT 156 179 741    Basic Metabolic Panel: Recent Labs  Lab 01/10/21 2020 01/11/21 0111 01/12/21 0953 01/13/21 1105  NA 139  --  141 137  K 4.4  --  3.6 3.6  CL 103  --  105 105  CO2 26  --  23 21*  GLUCOSE 169*  --  124* 134*  BUN 73*  --  77* 71*  CREATININE 3.17*  --  3.01* 2.82*  CALCIUM 8.7*  --  8.8* 8.6*  MG  --   1.5* 1.9 1.7  PHOS  --  4.4 4.5 4.1   GFR: Estimated Creatinine Clearance: 26.5 mL/min (A) (by C-G formula based on SCr of 2.82 mg/dL (H)). Recent Labs  Lab 01/10/21 2020 01/12/21 0953 01/13/21 1105  WBC 5.5 6.9 7.7    Liver Function Tests: Recent Labs  Lab 01/10/21 2020  AST 16  ALT 7  ALKPHOS 51  BILITOT 1.0  PROT 7.7  ALBUMIN 2.8*   No results for input(s): LIPASE, AMYLASE in the last 168 hours. Recent Labs  Lab 01/11/21 0111  AMMONIA 13    ABG    Component Value Date/Time   PHART 7.43 01/13/2021 0947   PCO2ART 30 (L) 01/13/2021 0947   PO2ART 83 01/13/2021 0947   HCO3 19.9 (L) 01/13/2021 0947   ACIDBASEDEF 3.7 (H) 01/13/2021 0947   O2SAT 96.5 01/13/2021 0947     Coagulation Profile: No results for input(s): INR, PROTIME in the last 168 hours.  Cardiac Enzymes: No results for input(s): CKTOTAL, CKMB, CKMBINDEX, TROPONINI in the last 168 hours.  HbA1C: Hgb A1c MFr Bld  Date/Time Value Ref Range Status  01/11/2021 01:11 AM 5.2 4.8 - 5.6 % Final    Comment:    (NOTE) Pre diabetes:          5.7%-6.4%  Diabetes:              >6.4%  Glycemic control for   <7.0% adults with diabetes     CBG: Recent Labs  Lab 01/12/21 1242 01/12/21 1623 01/12/21 2133 01/13/21 0825 01/13/21 1230  GLUCAP 117* 110* 128* 123* 127*    Review of Systems:   Unable to assess due to AMS and BiPAP   Past Medical History:  He,  has a past medical history of Atrial fibrillation ([place]), Diabetes ([place]), HTN (hypertension), Hyperlipidemia, Renal transplant recipient, and Vascular dementia ([place]).   Surgical History:   Past Surgical History:  Procedure Laterality Date   KIDNEY TRANSPLANT       Social History:      Family History:  His family history is not on file.   Allergies Allergies  Allergen Reactions   Baclofen    Lisinopril    Remeron [Mirtazapine]    Vancomycin    Zofran [Ondansetron]      Home Medications  Prior to Admission medications    Medication Sig Start Date End Date Taking? Authorizing Provider  Alogliptin Benzoate 6.25 MG TABS Take 1 tablet by mouth daily. 07/29/20  Yes [provider]  atorvastatin (LIPITOR) 40 MG tablet Take 40 mg by mouth daily as needed. 07/29/20  Yes [provider]  carvedilol (COREG) 25 MG tablet Take 2 tablets by mouth 2 (two) times daily. 07/29/20  Yes [provider]  Cholecalciferol 25 MCG (1000 UT) tablet Take 2 tablets by mouth daily. 11/29/20  Yes [provider]  cycloSPORINE (SANDIMMUNE) 25 MG capsule Take 3 capsules by mouth 2 (two) times daily. 07/29/20  Yes [provider]  DULoxetine (CYMBALTA) 60 MG capsule Take 60 mg by mouth daily. 07/29/20  Yes [provider]  ELIQUIS 5 MG TABS tablet Take 5 mg by mouth 2 (two) times daily. 08/30/20  Yes [provider]  finasteride (PROSCAR) 5 MG tablet Take 5 mg by mouth daily. 07/29/20  Yes [provider]  Lacosamide 100 MG TABS Take 1 tablet by mouth every 12 (twelve) hours. 09/01/20  Yes [provider]  losartan (COZAAR) 100 MG tablet Take 100 mg by mouth daily. 07/29/20  Yes [provider]  mycophenolate (MYFORTIC) 180 MG EC tablet Take 180 mg by mouth 2 (two) times daily. 08/01/20  Yes [provider]  sodium bicarbonate 650 MG tablet Take 1 tablet by mouth 3 (three) times daily. 08/01/20  Yes [provider]  tamsulosin (FLOMAX) 0.4 MG CAPS capsule Take 0.8 mg by mouth daily. 07/29/20  Yes [provider]  torsemide (DEMADEX) 20 MG tablet Take 20 mg by mouth daily.   Yes [provider]  acetaminophen (TYLENOL) 325 MG tablet Take 2 tablets by mouth every 6 (six) hours as needed.    [provider]  amLODipine (NORVASC) 10 MG tablet Take 10 mg by mouth daily. Patient not taking: Reported on 01/10/2021 07/29/20   [provider]  Cholecalciferol (VITAMIN D3) 50 MCG (2000 UT) TABS Take 1 tablet by mouth  daily. Patient not taking: Reported on 01/10/2021 07/29/20   [provider]     Care time: 50 minutes     Darel Hong, AGACNP-BC [facility] epic messenger for cross cover needs If after hours, please call E-link

## 2021-01-13 NOTE — ED Notes (Signed)
CBG 127 

## 2021-01-13 NOTE — ED Notes (Signed)
Called lab to come draw morning labs.

## 2021-01-13 NOTE — Progress Notes (Signed)
PT Cancellation Note  Patient Details Name: Jared Gregory MRN: [id number] DOB: 1955-04-01   Cancelled Treatment:    Reason Eval/Treat Not Completed: Patient not medically ready.  Per chart review, pt now placed on Bipap, is very agitated, and nurse requesting more IV sedation.  Will hold PT at this time and re-attempt PT evaluation once pt is medically appropriate and able to safely participate in therapy.  Leitha Bleak, PT 01/13/21, 1:45 PM

## 2021-01-13 NOTE — ED Notes (Signed)
Messaged attending to request IV sedation. Pt agitated and attempting to remove bipap.

## 2021-01-14 ENCOUNTER — Inpatient Hospital Stay: Payer: No Typology Code available for payment source

## 2021-01-14 DIAGNOSIS — I4891 Unspecified atrial fibrillation: Secondary | ICD-10-CM

## 2021-01-14 DIAGNOSIS — I10 Essential (primary) hypertension: Secondary | ICD-10-CM

## 2021-01-14 DIAGNOSIS — R569 Unspecified convulsions: Principal | ICD-10-CM

## 2021-01-14 LAB — PHOSPHORUS: Phosphorus: 6.1 mg/dL — ABNORMAL HIGH (ref 2.5–4.6)

## 2021-01-14 LAB — BASIC METABOLIC PANEL
Anion gap: 11 (ref 5–15)
BUN: 81 mg/dL — ABNORMAL HIGH (ref 8–23)
CO2: 18 mmol/L — ABNORMAL LOW (ref 22–32)
Calcium: 8.5 mg/dL — ABNORMAL LOW (ref 8.9–10.3)
Chloride: 106 mmol/L (ref 98–111)
Creatinine, Ser: 3.12 mg/dL — ABNORMAL HIGH (ref 0.61–1.24)
GFR, Estimated: 21 mL/min — ABNORMAL LOW (ref >60)
Glucose, Bld: 164 mg/dL — ABNORMAL HIGH (ref 70–99)
Potassium: 3.9 mmol/L (ref 3.5–5.1)
Sodium: 135 mmol/L (ref 135–145)

## 2021-01-14 LAB — PREPARE RBC (CROSSMATCH)

## 2021-01-14 LAB — IRON AND TIBC
Iron: 142 ug/dL (ref 45–182)
Saturation Ratios: 57 % — ABNORMAL HIGH (ref 17.9–39.5)
TIBC: 248 ug/dL — ABNORMAL LOW (ref 250–450)
UIBC: 106 ug/dL

## 2021-01-14 LAB — CBC
HCT: 22.4 % — ABNORMAL LOW (ref 39.0–52.0)
Hemoglobin: 7.1 g/dL — ABNORMAL LOW (ref 13.0–17.0)
MCH: 24 pg — ABNORMAL LOW (ref 26.0–34.0)
MCHC: 31.7 g/dL (ref 30.0–36.0)
MCV: 75.7 fL — ABNORMAL LOW (ref 80.0–100.0)
Platelets: 224 10*3/uL (ref 150–400)
RBC: 2.96 MIL/uL — ABNORMAL LOW (ref 4.22–5.81)
RDW: 25.1 % — ABNORMAL HIGH (ref 11.5–15.5)
WBC: 3.5 10*3/uL — ABNORMAL LOW (ref 4.0–10.5)
nRBC: 0 % (ref 0.0–0.2)

## 2021-01-14 LAB — ABO/RH: ABO/RH(D): B POS

## 2021-01-14 LAB — MRSA NEXT GEN BY PCR, NASAL: MRSA by PCR Next Gen: NOT DETECTED

## 2021-01-14 LAB — MAGNESIUM: Magnesium: 1.8 mg/dL (ref 1.7–2.4)

## 2021-01-14 LAB — CBG MONITORING, ED: Glucose-Capillary: 155 mg/dL — ABNORMAL HIGH (ref 70–99)

## 2021-01-14 LAB — VITAMIN B12: Vitamin B-12: 526 pg/mL (ref 180–914)

## 2021-01-14 LAB — FOLATE: Folate: 5.5 ng/mL — ABNORMAL LOW (ref >5.9)

## 2021-01-14 LAB — PROCALCITONIN: Procalcitonin: 0.17 ng/mL

## 2021-01-14 LAB — GLUCOSE, CAPILLARY
Glucose-Capillary: 144 mg/dL — ABNORMAL HIGH (ref 70–99)
Glucose-Capillary: 135 mg/dL — ABNORMAL HIGH (ref 70–99)
Glucose-Capillary: 145 mg/dL — ABNORMAL HIGH (ref 70–99)

## 2021-01-14 MED ORDER — SODIUM CHLORIDE 0.9% IV SOLUTION
Freq: Once | INTRAVENOUS | Status: AC
  Filled 2021-01-14: qty 250

## 2021-01-14 MED ORDER — CHLORHEXIDINE GLUCONATE CLOTH 2 % EX PADS
6.0000 | MEDICATED_PAD | Freq: Every day | CUTANEOUS | Status: DC
  Administered 2021-01-15 – 2021-01-19 (×5): 6 via TOPICAL

## 2021-01-14 MED ORDER — METHYLPREDNISOLONE SODIUM SUCC 40 MG IJ SOLR
40.0000 mg | Freq: Two times a day (BID) | INTRAMUSCULAR | Status: DC
  Administered 2021-01-14 – 2021-01-15 (×2): 40 mg via INTRAVENOUS
  Filled 2021-01-14 (×2): qty 1

## 2021-01-14 MED ORDER — BISACODYL 10 MG RE SUPP
10.0000 mg | Freq: Once | RECTAL | Status: AC
  Administered 2021-01-14: 10 mg via RECTAL
  Filled 2021-01-14: qty 1

## 2021-01-14 MED ORDER — SODIUM BICARBONATE 8.4 % IV SOLN
50.0000 meq | INTRAVENOUS | Status: AC
  Administered 2021-01-14 (×2): 50 meq via INTRAVENOUS
  Filled 2021-01-14: qty 100

## 2021-01-14 MED ORDER — ORAL CARE MOUTH RINSE
15.0000 mL | Freq: Two times a day (BID) | OROMUCOSAL | Status: DC
  Administered 2021-01-14 – 2021-01-19 (×11): 15 mL via OROMUCOSAL

## 2021-01-14 NOTE — ED Notes (Signed)
Informed RN bed assigned 

## 2021-01-14 NOTE — Progress Notes (Signed)
NAME:  Jared Gregory, MRN:  [id number], DOB:  1955/06/07, LOS: 3 ADMISSION DATE:  01/10/2021, CONSULTATION DATE:  01/14/2020 REFERRING MD:  Dr. Dwyane Dee, CHIEF COMPLAINT:  Seizure, AMS, Acute Respiratory Distress   Brief Pt Description / Synopsis:  66 year old male admitted with concerns of seizure activity.  On 01/13/2021 developed worsening agitation, increased work of breathing requiring BiPAP.  Found to have multifocal pneumonia and UTI.  PCCM consulted for possible Precedex infusion.   Pertinent  Medical History  seizures on lacosamide ESRD status post kidney transplants about 5 years ago in [place] atrial fibrillation on Coreg and Eliquis CVA Anxiety Depression hyperlipidemia  Hypertension Diabetes Mellitus Vascular Dementia  Micro Data:  01/11/2021: 6 SARS-CoV-2 and influenza PCR>> negative 01/13/2021: Respiratory viral panel>> 01/13/2021: Blood culture x2>> 01/13/2021: Urine>> 01/13/2021: Strep pneumo urinary antigen>> 01/13/2021: Legionella urinary antigen>> 01/13/2021: Sputum>>  Antimicrobials:  Azithromycin 1/6>> Ceftriaxone 1/6>>  Significant Hospital Events: Including procedures, antibiotic start and stop dates in addition to other pertinent events   01/10/2021: Admitted by hospitalist for breakthrough seizures 01/11/2021: Evaluated by neurology, Vimpat dose increased.  EEG obtained without seizure activity 01/13/2021: Patient increasingly agitated and restless receiving multiple doses of Ativan.  Also noted to have increased work of breathing and hypertension of which she was placed on BiPAP and nicardipine drip.  PCCM asked to consult due to possible need for Precedex. 01/15/20 - patient has pneumonia with ckd post transplant. Possible seizures and meningitis due to neck stiffness and previoulsy lucid mentation. Neurology and ID consult placed today.   Interim History / Subjective:  -Patient with increased agitation and restlessness today requiring multiple doses of Ativan -Also with  noted work of breathing and hypertension, was subsequent placed on BiPAP and nicardipine infusion -Found to have UTI and pneumonia ~started on azithromycin and ceftriaxone -PCCM asked to consult due to possible need for Precedex -Upon PCCM examination, patient comfortable and resting status post Ativan, removed from BiPAP and placed on 2 L nasal cannula and tolerating -Would recommend to continue with as needed Ativan and as needed Haldol -Could consider Precedex if PRN medications not effective  Objective   Blood pressure (!) 143/81, pulse 82, temperature 98.3 F (36.8 C), temperature source Axillary, resp. rate 17, height 5\' 9"  (1.753 m), weight 70.8 kg, SpO2 98 %.        Intake/Output Summary (Last 24 hours) at 01/14/2021 1715 Last data filed at 01/14/2021 1629 Gross per 24 hour  Intake 502.18 ml  Output 600 ml  Net -97.82 ml    Filed Weights   01/10/21 1926 01/14/21 1152  Weight: 76.2 kg 70.8 kg    Examination: General: Acutely ill-appearing male, sitting in bed, on BiPAP, no acute distress (calm after receiving Ativan approximately 15 minutes ago) HENT: Atraumatic, normocephalic, neck supple, no JVD Lungs: Coarse breath sounds bilaterally, mild expiratory wheeze, even, nonlabored Cardiovascular: Regular rate and rhythm, S1-S2, no murmurs, rubs, gallops Abdomen: Soft, nontender, nondistended, no guarding rebound tenderness, bowel sounds positive x4 Extremities: No deformities, no edema Neuro: Lethargic status post Ativan administration, arouses easily to voice and follows simple commands, no focal deficits noted, pupils PERRLA, unable to assess orientation due to altered mental status and BiPAP GU: Deferred  Resolved Hospital Problem list     Assessment & Plan:   Acute Hypoxic Respiratory Failure in the setting of Multifocal Pneumonia (? Aspiration) -Supplemental O2 as needed to maintain O2 sats >92% -BiPAP, wean as tolerated -Follow intermittent CXR & ABG as needed -ABX  as above -Bronchodilators -  Continue Breo Ellipta -Aggressive pulmonary hygiene/toilet -Aspiration precautions  Multifocal Pneumonia & UTI -Monitor fever curve -Trend WBC's & Procalcitonin -Follow cultures as above -Continue empiric Azithromycin & Ceftriaxone pending cultures & sensitivities  Essential Hypertension Mildly Elevated Troponin, suspect demand ischemia PMHx: Atrial fibrillation on Apixaban -Continuous cardiac monitoring -Continue Coreg and Losartan -Nicardipine gtt as needed -HS Troponin peaked at 40 -Continue Eliquis -Echocardiogram 01/11/21: LVEF 60-65%, unable to evaluate diastolic parameters, RV function normal, normal pulmonary artery systolic pressure, small pericardial effusion, moderate mitral stenosis  Acute Metabolic Encephalopathy in the setting of UTI & Pneumonia & Seizures PMHx: Depression/Anxiety, seizures -Seizure precaution -Provide supportive care -Promote normal sleep/wake cycle -Treat infections as above -Avoid sedating meds as able -Prn Ativan and Haldol for severe agitation -CT Head negative for acute intracranial abnormality -MRI Brain negative for acute process -Urine drug screen positive for benzodiazepine -Continue Vimpat 150 mg twice daily -Seen by Neurology, has since signed off -EEG negative for seizures  AKI on CKD Stage IV PMHx: Renal Transplant -Monitor I&O's / urinary output -Follow BMP -Ensure adequate renal perfusion -Avoid nephrotoxic agents as able -Replace electrolytes as indicated -Continue cyclosporine and Mycophenolate -Consider nephrology consult   Best Practice (right click and "Reselect all SmartList Selections" daily)   Diet/type: Regular consistency (see orders) DVT prophylaxis: DOAC GI prophylaxis: N/A Lines: N/A Foley:  N/A Code Status:  full code Last date of multidisciplinary goals of care discussion [N/A]  Labs   CBC: Recent Labs  Lab 01/10/21 2020 01/12/21 0953 01/13/21 1105 01/14/21 0706   WBC 5.5 6.9 7.7 3.5*  HGB 7.3* 8.1* 8.3* 7.1*  HCT 24.1* 25.7* 25.9* 22.4*  MCV 76.8* 76.3* 75.1* 75.7*  PLT 156 179 207 224     Basic Metabolic Panel: Recent Labs  Lab 01/10/21 2020 01/11/21 0111 01/12/21 0953 01/13/21 1105 01/14/21 0706  NA 139  --  141 137 135  K 4.4  --  3.6 3.6 3.9  CL 103  --  105 105 106  CO2 26  --  23 21* 18*  GLUCOSE 169*  --  124* 134* 164*  BUN 73*  --  77* 71* 81*  CREATININE 3.17*  --  3.01* 2.82* 3.12*  CALCIUM 8.7*  --  8.8* 8.6* 8.5*  MG  --  1.5* 1.9 1.7 1.8  PHOS  --  4.4 4.5 4.1 6.1*    GFR: Estimated Creatinine Clearance: 23.9 mL/min (A) (by C-G formula based on SCr of 3.12 mg/dL (H)). Recent Labs  Lab 01/10/21 2020 01/12/21 0953 01/13/21 1105 01/14/21 0705 01/14/21 0706  PROCALCITON  --   --   --  0.17  --   WBC 5.5 6.9 7.7  --  3.5*     Liver Function Tests: Recent Labs  Lab 01/10/21 2020  AST 16  ALT 7  ALKPHOS 51  BILITOT 1.0  PROT 7.7  ALBUMIN 2.8*    No results for input(s): LIPASE, AMYLASE in the last 168 hours. Recent Labs  Lab 01/11/21 0111  AMMONIA 13     ABG    Component Value Date/Time   PHART 7.43 01/13/2021 0947   PCO2ART 30 (L) 01/13/2021 0947   PO2ART 83 01/13/2021 0947   HCO3 19.9 (L) 01/13/2021 0947   ACIDBASEDEF 3.7 (H) 01/13/2021 0947   O2SAT 96.5 01/13/2021 0947      Coagulation Profile: No results for input(s): INR, PROTIME in the last 168 hours.  Cardiac Enzymes: No results for input(s): CKTOTAL, CKMB, CKMBINDEX, TROPONINI in the last 168 hours.  HbA1C: Hgb A1c MFr Bld  Date/Time Value Ref Range Status  01/11/2021 01:11 AM 5.2 4.8 - 5.6 % Final    Comment:    (NOTE) Pre diabetes:          5.7%-6.4%  Diabetes:              >6.4%  Glycemic control for   <7.0% adults with diabetes     CBG: Recent Labs  Lab 01/13/21 0825 01/13/21 1230 01/13/21 2130 01/14/21 0753 01/14/21 1152  GLUCAP 123* 127* 165* 155* 144*     Review of Systems:   Unable to assess  due to AMS and BiPAP   Past Medical History:  He,  has a past medical history of Atrial fibrillation ([place]), Diabetes ([place]), HTN (hypertension), Hyperlipidemia, Renal transplant recipient, and Vascular dementia ([place]).   Surgical History:   Past Surgical History:  Procedure Laterality Date   KIDNEY TRANSPLANT       Social History:      Family History:  His family history is not on file.   Allergies Allergies  Allergen Reactions   Baclofen    Lisinopril    Remeron [Mirtazapine]    Vancomycin    Zofran [Ondansetron]      Home Medications  Prior to Admission medications   Medication Sig Start Date End Date Taking? Authorizing Provider  Alogliptin Benzoate 6.25 MG TABS Take 1 tablet by mouth daily. 07/29/20  Yes [provider]  atorvastatin (LIPITOR) 40 MG tablet Take 40 mg by mouth daily as needed. 07/29/20  Yes [provider]  carvedilol (COREG) 25 MG tablet Take 2 tablets by mouth 2 (two) times daily. 07/29/20  Yes [provider]  Cholecalciferol 25 MCG (1000 UT) tablet Take 2 tablets by mouth daily. 11/29/20  Yes [provider]  cycloSPORINE (SANDIMMUNE) 25 MG capsule Take 3 capsules by mouth 2 (two) times daily. 07/29/20  Yes [provider]  DULoxetine (CYMBALTA) 60 MG capsule Take 60 mg by mouth daily. 07/29/20  Yes [provider]  ELIQUIS 5 MG TABS tablet Take 5 mg by mouth 2 (two) times daily. 08/30/20  Yes [provider]  finasteride (PROSCAR) 5 MG tablet Take 5 mg by mouth daily. 07/29/20  Yes [provider]  Lacosamide 100 MG TABS Take 1 tablet by mouth every 12 (twelve) hours. 09/01/20  Yes [provider]  losartan (COZAAR) 100 MG tablet Take 100 mg by mouth daily. 07/29/20  Yes [provider]  mycophenolate (MYFORTIC) 180 MG EC tablet Take 180 mg by mouth 2 (two) times daily. 08/01/20  Yes [provider]  sodium bicarbonate 650 MG tablet Take 1 tablet by mouth 3  (three) times daily. 08/01/20  Yes [provider]  tamsulosin (FLOMAX) 0.4 MG CAPS capsule Take 0.8 mg by mouth daily. 07/29/20  Yes [provider]  torsemide (DEMADEX) 20 MG tablet Take 20 mg by mouth daily.   Yes [provider]  acetaminophen (TYLENOL) 325 MG tablet Take 2 tablets by mouth every 6 (six) hours as needed.    [provider]  amLODipine (NORVASC) 10 MG tablet Take 10 mg by mouth daily. Patient not taking: Reported on 01/10/2021 07/29/20   [provider]  Cholecalciferol (VITAMIN D3) 50 MCG (2000 UT) TABS Take 1 tablet by mouth daily. Patient not taking: Reported on 01/10/2021 07/29/20   [provider]     Critical care provider statement:   Total critical care time: 33 minutes   Performed by:  Hayes Rehfeldt MD   Critical care time was exclusive of separately billable procedures and treating other patients.   Critical care was necessary to treat or prevent imminent or life-threatening deterioration.   Critical care was time spent personally by me on the following activities: development of treatment plan with patient and/or surrogate as well as nursing, discussions with consultants, evaluation of patient's response to treatment, examination of patient, obtaining history from patient or surrogate, ordering and performing treatments and interventions, ordering and review of laboratory studies, ordering and review of radiographic studies, pulse oximetry and re-evaluation of patient's condition.    Ottie Glazier, M.D.  Pulmonary & Critical Care Medicine

## 2021-01-14 NOTE — Progress Notes (Signed)
Eeg done 

## 2021-01-14 NOTE — Progress Notes (Signed)
Subjective: Since I last saw Jared Gregory on the 5th, he has gotten significantly worse.  He began complaining of anxiety, then shortness of breath over the course of yesterday morning.  It was noted that he was using accessory muscles of breathing, in the setting started becoming more confused.  He became severely hypertensive and was started on nicardipine.  It was noted that he was satting in the low 80s after removing his nasal cannula.  He transiently improved, then began having repeat increased work of breathing, and he was placed on BiPAP.  He was very agitated throughout most of the day.  CT chest revealed multiple patchy opacities concerning for multifocal pneumonia versus flash pulmonary edema.  He has continued to become more confused and was moved to the intensive care unit today.  Exam: Vitals:   01/14/21 1152 01/14/21 1336  BP: 136/83 135/72  Pulse: 81 81  Resp: 18 18  Temp: 97.7 F (36.5 C) 98.3 F (36.8 C)  SpO2: 95% 96%   Gen: In bed, NAD Resp: non-labored breathing, no acute distress Abd: soft, nt He resists passive movement in all extremities, including neck flexion, but when I ask him to he is able to pull his chin towards his chest.  Neuro: MS: awakens, answers "yes" to most questions, but does follow some simple commands.  CN: eyes conjugate, cross midline in both directions.  Motor: He follows commands in all four extremities Sensory: Response to noxious stimulation all four extremities   Pertinent Labs: Creatinine 3.12, BUN 80 Urinalysis from yesterday, gram-negative rods and Klebsiella   Impression: 65 year old male with a history of seizures as well as vascular dementia with worsening mental status in the setting of pulmonary edema/pneumonia and urinary tract infection.  At this point, my suspicion for CNS infection is relatively low, and I favor his medical condition in the setting of vascular dementia is more likely causing a multifactorial delirium.  This  very well could be compounded by Ativan and Haldol given last night for agitation.  Recommendations: 1) EEG to rule out seizure as etiology of his worsening mental status 2) continue lacosamide 150 twice daily 3) continue treatment of pneumonia/UTI per internal medicine.  Roland Rack, MD [facility] [phone]  If 7pm- 7am, please page neurology on call as listed in [place].

## 2021-01-14 NOTE — Evaluation (Signed)
Physical Therapy Evaluation Patient Details Name: Jared Gregory MRN: [id number] DOB: [birth year] Today's Date: 01/14/2021  History of Present Illness  Jared Gregory is a 66 y.o. male with medical history significant for anxiety, depression, hyperlipidemia, atrial fibrillation on Coreg and Eliquis, cva in January 03, 2020, history of seizures on lacosamide, ESRD status post kidney transplant about 5 years ago in [place], who presents emergency department for chief concerns of seizure activity from facility [facility]).   Clinical Impression  Patient received reclining in bed. Very drowsy but responds with a few words when spoken to. Unable to follow commands for movement and falls back to sleep quickly. Does not speak in complete sentences and is unable to provide history. Does not resist participation in PT. Per chart review, patient appears to be from [facility] where he lived in long term skilled nursing care but he has experienced a significant decline from baseline in his cognition and physical abilities. Upon PT eval, patient required maxA for supine <> sit transfer and was unable to maintain seated balance at edge of bed. He required total A +2 to boost up bed. Patient's ability to participate meaningfully in PT is still very limited by his level of awareness. Patient appears to have experienced a decline in functional independence/mobility and would benefit from short term rehab to return to PLOF. He appears to have been in long term skilled nursing care at baseline and is continue to need long term care as well. He would benefit from a trial of PT for improvement towards baseline functioning. Patient would benefit from skilled physical therapy to address impairments and functional limitations (see PT Problem List below) to work towards stated goals and return to PLOF or maximal functional independence.       Recommendations for follow up therapy are one component of a  multi-disciplinary discharge planning process, led by the attending physician.  Recommendations may be updated based on patient status, additional functional criteria and insurance authorization.  Follow Up Recommendations Skilled nursing-short term rehab (<3 hours/day)    Assistance Recommended at Discharge Intermittent Supervision/Assistance  Patient can return home with the following  Other (comment) (Patient is completely dependent for all care.)    Equipment Recommendations    Recommendations for Other Services       Functional Status Assessment  (Patient has had a recent decline in their functional status and demonstrates unclear ability to make significant improvements in function in a reasonable and predictable amount of time.)     Precautions / Restrictions Precautions Precautions: Fall Precaution Comments: seizure      Mobility  Bed Mobility Overal bed mobility: Needs Assistance Bed Mobility: Sit to Supine;Supine to Sit     Supine to sit: Max assist Sit to supine: Max assist;Total assist;+2 for physical assistance   General bed mobility comments: Patient transfered supine to sit at edge of bed with max A support and trunk and legs. Patient did not resist and appeared to be attempting to cooperate but was unable to contribut much. Patient with similar contribution to supine to sit but needed total A +2 to boost up in bed.    Transfers                   General transfer comment: unable to safely attempt transfer at this time    Ambulation/Gait               General Gait Details: unable to safely attempt gait at this time  Stairs            Wheelchair Mobility    Modified Rankin (Stroke Patients Only)       Balance Overall balance assessment: Needs assistance Sitting-balance support: Feet unsupported;Bilateral upper extremity supported Sitting balance-Leahy Scale: Poor Sitting balance - Comments: Patient is unable to maintain seated  posture at edge of bed without support. Does not push or forcefully lean but cannot maintain balance.     Standing balance-Leahy Scale: Zero Standing balance comment: unable to attempt stand at this time                             Pertinent Vitals/Pain Pain Assessment: No/denies pain Facial Expression: Relaxed, neutral Body Movements: Absence of movements Muscle Tension: Relaxed Compliance with ventilator (intubated pts.): N/A Vocalization (extubated pts.): Talking in normal tone or no sound CPOT Total: 0 Pain Intervention(s): Limited activity within patient's tolerance;Monitored during session;Repositioned    Home Living Family/patient expects to be discharged to:: Skilled nursing facility                   Additional Comments: Patient is from [facility] where he lives    Prior Function Prior Level of Function : Patient poor historian/Family not available (Patient is not able to provide history or prior level of function. Per RN she understands that patient has experienced a decline in status. He apparently lives in long term care at [facility] (SNF).)                     Hand Dominance        Extremity/Trunk Assessment   Upper Extremity Assessment Upper Extremity Assessment: Difficult to assess due to impaired cognition (Patient does not follow commands for movement.)    Lower Extremity Assessment Lower Extremity Assessment: Difficult to assess due to impaired cognition (Patient does not follow commands for movement, keeps knees and hips flexed. Low muscle mass)    Cervical / Trunk Assessment Cervical / Trunk Assessment: Normal  Communication   Communication: Other (comment) (too drowsy to communicate effectively)  Cognition Arousal/Alertness: Lethargic   Overall Cognitive Status: No family/caregiver present to determine baseline cognitive functioning                                 General Comments: Patient is  extremely drowsy and intermittantly responds when spoken to with a few words before going back to sleep. Does not form complete sentances or thoughs. Keeps eyes closed except breifly. Per documentation patient was conversing in ED when arrived so this is a decline in function.n        General Comments General comments (skin integrity, edema, etc.): Patient keeps eyes closed throughout session, vitals remained WFL.    Exercises     Assessment/Plan    PT Assessment Patient needs continued PT services  PT Problem List Decreased strength;Decreased coordination;Decreased range of motion;Decreased cognition;Decreased balance;Decreased safety awareness;Decreased activity tolerance;Decreased knowledge of use of DME;Decreased knowledge of precautions;Decreased mobility       PT Treatment Interventions DME instruction;Gait training;Functional mobility training;Therapeutic activities;Therapeutic exercise;Patient/family education;Neuromuscular re-education;Balance training    PT Goals (Current goals can be found in the Care Plan section)  Acute Rehab PT Goals PT Goal Formulation: Patient unable to participate in goal setting    Frequency Min 2X/week     Co-evaluation  AM-PAC PT "6 Clicks" Mobility  Outcome Measure Help needed turning from your back to your side while in a flat bed without using bedrails?: Total Help needed moving from lying on your back to sitting on the side of a flat bed without using bedrails?: Total Help needed moving to and from a bed to a chair (including a wheelchair)?: Total Help needed standing up from a chair using your arms (e.g., wheelchair or bedside chair)?: Total Help needed to walk in hospital room?: Total Help needed climbing 3-5 steps with a railing? : Total 6 Click Score: 6    End of Session   Activity Tolerance: Patient tolerated treatment well;Patient limited by lethargy Patient left: in bed Nurse Communication: Mobility  status PT Visit Diagnosis: Other abnormalities of gait and mobility (R26.89);Unsteadiness on feet (R26.81);Muscle weakness (generalized) (M62.81);Difficulty in walking, not elsewhere classified (R26.2);Other symptoms and signs involving the nervous system (L73.736)    Time: 6815-9470 PT Time Calculation (min) (ACUTE ONLY): 9 min   Charges:   PT Evaluation $PT Eval Low Complexity: 1 Low          Amado Andal R. Graylon Good, PT, DPT 01/14/21, 4:46 PM

## 2021-01-14 NOTE — Progress Notes (Signed)
[facility] Progress Note  Patient: Jared Gregory    WPY:[id number]  DOA: 01/10/2021     Date of Service: the patient was seen and examined on 01/14/2021  Chief Complaint  Patient presents with   Seizures    Pt from [facility] had 3 witnessed seizures ranging from 30 seconds to 1 minute. Pt has hx of epilepsy. Given 2 of versed by EMS. Post ictal on arrival   Brief hospital course: Jared Gregory is a 66 y.o. male with medical history significant for anxiety, depression, hyperlipidemia, atrial fibrillation on Coreg and Eliquis, cva in January 03, 2020, history of seizures on lacosamide, ESRD status post kidney transplant about 5 years ago in [place], who presents emergency department for chief concerns of seizure activity from facility. ED Course: Discussed with emergency medicine provider, patient requiring hospitalization for chief concerns of seizure.  Vitals in the emergency department showed temperature of 97.4, respiration rate of 20, heart rate of 96, initial blood pressure 194/112, improved to 181/106, SPO2 100% on room air.  Serum sodium 139, potassium 4.4, chloride 103, bicarb 26, BUN is 18, serum creatinine 3.17, nonfasting blood glucose 169, WBC 5.5, hemoglobin 7.3, platelets of 156.  GFR of 21.  Troponin was 40.      Assessment and Plan:   Principal Problem:   Seizure ([place]) Active Problems:   Depression   Essential hypertension   On continuous oral anticoagulation   BPH (benign prostatic hyperplasia)   # Seizure activity # Increased somnolence, Post ictal and due to ativan  Patient is status post Keppra 1 g IV - Increased Lacosamide 150 mg IV twice daily as per neurology, will transition to oral after improvement. - Ativan injection 2 mg IV as needed for breakthrough seizures, 3 doses ordered with instructions for nursing staff to alert provider once IV Ativan has been used - continue aspiration, fall precautions, seizure precautions EEG negative for epilepsy Follow  neurology for further recommendation and clearance for discharge   Acute respiratory failure secondary to community-acquired pneumonia Continue supplemental O2 inhalation and gradually wean off Started ceftriaxone and azithromycin CT chest Multiple patchy airspace opacities are noted in both upper lobes concerning for multifocal pneumonia. Small bilateral pleural effusions are noted with adjacent subsegmental atelectasis. Extensive coronary artery calcifications are noted suggesting coronary artery disease. respiratory viral panel negative, procalcitonin negative Follow urine antigen for strep and Legionella Follow blood cultures  COPD exacerbation secondary to underlying pneumonia Started Solu-Medrol 125 mg x 1 dose Started DuoNeb every 6 hourly and Breo Ellipta inhaler daily Pulmonary critical care consulted for further management Follow ABG New BiPAP as needed  Hypertensive urgency, blood pressure remained uncontrolled Continued home medications Coreg 50 mg p.o. twice daily, losartan 50 mg daily 1/6 started Cardene IV infusion  UTI, UA positive, continue ceftriaxone Follow urine culture growing Klebsiella pneumonia, follow sensitivity report.   # Elevated troponin - etiology unclear at this time - Troponin is downtrending which is reassuring - Complete echo ordered due to patient's HR has been difficult to control ranging from 110-150 - Patient is sleeping and difficult to arouse - BNP 1828 elevated, but clinically euvolemic, avoided Lasix due to AKI  Follow TTE LVEF 6065%, small circumferential pericardial effusion, moderate MS, severe mitral valve calcification.  Patient was recommended to follow with cardiology as an outpatient to repeat 2D echocardiogram after few months  # History of cva in December of 2021 per spouse - MRI of the brain No acute infarct, hemorrhage, or mass. Continue Lipitor and  patient is on Eliquis  # AKI on CKD stage IV, baseline creatinine 2.69 in sep  2022 - etiology work up in process  # History of renal transplant-resumed home cyclosporine 75 mg twice daily, mycophenolate 180 mg p.o. twice daily Check bladder scan to rule out urine retention Started IV fluid for gentle hydration as patient has decreased oral intake due to somnolence Check creatinine level tomorrow a.m. Nephrology consulted as per request by patient's wife 1/7 CO2 18, mild metabolic acidosis, sodium bicarbonate 50 M EQ x2 doses given, patient was somnolent and unable to take oral bicarbonate. Monitor BMP daily   Anemia of chronic disease most likely due to CKD stage IV On 1/7 Hb 7.1, transfused 1 unit of PRBC, patient's wife consented for blood transfusion. Check anemia work-up Follow FOBT to rule out GI bleeding    Depression/anxiety-resume duloxetine 60 mg daily Started Atarax 25 mg p.o. 3 times daily due to worsening of anxiety  Chronic persistent atrial fibrillation-resumed Coreg 50 mg twice daily, apixaban 5 mg p.o. twice daily starting on 01/11/2021 Patient had A. fib with RVR, resolved s/p Cardizem drip, which was discontinued   BPH-Flomax resumed   Hypomagnesemia, mag repleted.   Body mass index is 24.81 kg/m.  Interventions:   Follow PT and OT eval for disposition plan    Diet: Renal diet DVT Prophylaxis: Therapeutic Anticoagulation with Eliquis    Advance goals of care discussion: Full code  Family Communication: family was NOT present at bedside, at the time of interview.  The pt provided permission to discuss medical plan with the family. Opportunity was given to ask question and all questions were answered satisfactorily.   Disposition:  Pt is from Home, admitted with seizure, gradually improving, still has high blood pressure, and on IV Vimpat which precludes a safe discharge. Discharge to SNF, pending PT and OT eval, when clinically stable, needs clearance by neurology.. 1/7 [facility] is not excepting patients, they are on diversion for  the past 4 days so cannot transfer patient at this time, we will continue to try.   Subjective: Overnight patient was agitated so he was given Haldol, in the morning time patient was completely obtunded and unable to offer any complaints, he was completely sleeping, breathing was deep and shallow.   Physical Exam: General:  obtunded not oriented to time, place, and person.  Appear in no distress, affect unresponsive Eyes: closed,  ENT: Oral Mucosa Clear, moist  Neck: no JVD,  Cardiovascular: S1 and S2 Present, no Murmur,  Respiratory: good respiratory effort, Bilateral Air entry equal and Decreased, no Crackles, no wheezes Abdomen: Bowel Sound present, Soft and no tenderness,  Skin: no rashes Extremities: no Pedal edema, no calf tenderness Neurologic: Bilateral lower extremity weakness, left greater than right, power 4/5, no sensory deficit. Gait not checked due to patient safety concerns  Vitals:   01/14/21 1444 01/14/21 1500 01/14/21 1600 01/14/21 1629  BP:  132/83 (!) 129/94 (!) 143/81  Pulse: 80 86 82 82  Resp: 19 19 (!) 21 17  Temp:    98.3 F (36.8 C)  TempSrc:    Axillary  SpO2: 97% 100% 95% 98%  Weight:      Height:        Intake/Output Summary (Last 24 hours) at 01/14/2021 1641 Last data filed at 01/14/2021 1629 Gross per 24 hour  Intake 502.18 ml  Output 600 ml  Net -97.82 ml   Filed Weights   01/10/21 1926 01/14/21 1152  Weight: 76.2 kg  70.8 kg    Data Reviewed: I have personally reviewed and interpreted daily labs, tele strips, imagings as discussed above. I reviewed all nursing notes, pharmacy notes, vitals, pertinent old records I have discussed plan of care as described above with RN and patient/family.  CBC: Recent Labs  Lab 01/10/21 2020 01/12/21 0953 01/13/21 1105 01/14/21 0706  WBC 5.5 6.9 7.7 3.5*  HGB 7.3* 8.1* 8.3* 7.1*  HCT 24.1* 25.7* 25.9* 22.4*  MCV 76.8* 76.3* 75.1* 75.7*  PLT 156 179 207 154   Basic Metabolic Panel: Recent Labs   Lab 01/10/21 2020 01/11/21 0111 01/12/21 0953 01/13/21 1105 01/14/21 0706  NA 139  --  141 137 135  K 4.4  --  3.6 3.6 3.9  CL 103  --  105 105 106  CO2 26  --  23 21* 18*  GLUCOSE 169*  --  124* 134* 164*  BUN 73*  --  77* 71* 81*  CREATININE 3.17*  --  3.01* 2.82* 3.12*  CALCIUM 8.7*  --  8.8* 8.6* 8.5*  MG  --  1.5* 1.9 1.7 1.8  PHOS  --  4.4 4.5 4.1 6.1*    Studies: No results found.  Scheduled Meds:  apixaban  5 mg Oral BID   atorvastatin  40 mg Oral QHS   carvedilol  50 mg Oral BID WC   [START ON 01/15/2021] Chlorhexidine Gluconate Cloth  6 each Topical Q0600   cholecalciferol  2,000 Units Oral Daily   cycloSPORINE  75 mg Oral BID   DULoxetine  60 mg Oral Daily   fluticasone furoate-vilanterol  1 puff Inhalation Daily   hydrOXYzine  25 mg Oral TID   insulin aspart  0-5 Units Subcutaneous QHS   insulin aspart  0-9 Units Subcutaneous TID WC   ipratropium-albuterol  3 mL Nebulization Q6H   linagliptin  5 mg Oral Daily   losartan  50 mg Oral Daily   mouth rinse  15 mL Mouth Rinse BID   methylPREDNISolone (SOLU-MEDROL) injection  40 mg Intravenous Q12H   mycophenolate  180 mg Oral BID   tamsulosin  0.8 mg Oral QPC supper   Continuous Infusions:  sodium chloride 75 mL/hr at 01/14/21 0610   azithromycin 500 mg (01/14/21 1355)   cefTRIAXone (ROCEPHIN)  IV 1 g (01/14/21 1635)   lacosamide (VIMPAT) IV Stopped (01/14/21 0949)   niCARDipine 3 mg/hr (01/14/21 1326)   PRN Meds: diltiazem, haloperidol lactate, hydrALAZINE, LORazepam, metoprolol tartrate, ondansetron **OR** ondansetron (ZOFRAN) IV  Time spent: 35 minutes  Author: Val Riles. MD [facility] 01/14/2021 4:41 PM  To reach On-call, see care teams to locate the attending and reach out to them via [email or web address]. If 7PM-7AM, please contact night-coverage If you still have difficulty reaching the attending provider, please page the [facility] (Director on Call) for [facility] on amion for assistance.

## 2021-01-14 NOTE — Progress Notes (Signed)
Patient alert to self. Patient is responsive to minimal commands. Patient is in NSR, blood pressures stable, afebrile. Patient is lethargic but does awaken briefly to voice. Denies pain. Bladder scan performed, In/out with 600 mls. Report given to nurse for repeat scan if needed.

## 2021-01-14 NOTE — Progress Notes (Signed)
OT Cancellation Note  Patient Details Name: Jared Gregory MRN: [id number] DOB: [birth year]   Cancelled Treatment:    Reason Eval/Treat Not Completed: Patient's level of consciousness. Order received, chart reviewed. Pt received in ER, deeply drowsy. Unable to rouse pt despite several attempts. Will hold at present and attempt rehab services at a later date/time, as pt is awake and medically appropriate.  Josiah Lobo, PhD, MS, OTR/L 01/14/21, 12:13 PM

## 2021-01-14 NOTE — ED Notes (Signed)
Pt becoming increasingly agitated looking for his wheelchair and wallet - the patient was informed that his belongings were verified to be in his room at [facility]. Pt is still being uncooperative at this time, refusing to wear O2, and attempting to roll out of bed. Fall precautions remain in place. O2 sats on RA are 100%. View MAR for intervention. RN to continue to monitor.

## 2021-01-14 NOTE — Consult Note (Addendum)
NAME: Jared Gregory  DOB: 12-22-1955  MRN: [id number]  Date/Time: 01/14/2021 4:57 PM  REQUESTING PROVIDER: Lanney Gins Subjective:  REASON FOR CONSULT: meningitis ? Jared Gregory is a 66 y.o. male with a history of CVA, vascular dementia Renal transplant, atrial fibrillation, hepatitis C, hyperlipidemia, end-stage renal disease, diabetes mellitus, hypertension, BPH, multiple myeloma, secondary hyperparathyroidism, retinal hemorrhage, CAD, seizure activity Presented on 01/12/2020 brought in by EMS with active seizures.  His blood pressure was 212/116 as measured by EMS and heart rate 87 and sats 97% and respiratory rate 18%.  He was unresponsive.  In the ED his BP was 124/95, temperature signs 1.4, 139 heart rate and respiratory 20 and sats 100%. WBC was 5.5, Hb 7.3 and creatinine 3.17 and platelet 156.  CT scan of the head showed no acute intracranial hemorrhage or infarct.  Stable remote infarcts within the left basal ganglia, right thalamus and cerebellar hemispheres bilaterally. He was seen by neurologist on 01/11/2021 and patient was awake and was able to give history saying that he was not aware of seizures.  On examination on 01/11/2021 at 2:30 PM he was awake alert oriented to place person month year and situation.  He was able to give a clear and coherent history.  There was no focal neurological deficit.  He had mild asterixis.  The diagnosis was seizures and Vimpat was increased to 150 mg twice daily, EEG was done and did not show any seizure activity.  On 01/12/2021 the patient was stable and neurology had signed off.  On 01/14/2020 patient started to become agitated.   He was short of breath and BiPAP was placed.  Solu-Medrol was given.  His blood pressure was very high and he was started on nicardipine. I am asked to see him to r/o meningitis Pt is more awake now He is able to answer simple questions, follow commands   Past Surgical History:  Procedure Laterality Date   KIDNEY TRANSPLANT       Social History   Socioeconomic History   Marital status: Married    Spouse name: Not on file   Number of children: Not on file   Years of education: Not on file   Highest education level: Not on file  Occupational History   Not on file  Tobacco Use   Smoking status: Not on file   Smokeless tobacco: Not on file  Substance and Sexual Activity   Alcohol use: Not on file   Drug use: Not on file   Sexual activity: Not on file  Other Topics Concern   Not on file  Social History Narrative   Not on file   Social Determinants of Health   Financial Resource Strain: Not on file  Food Insecurity: Not on file  Transportation Needs: Not on file  Physical Activity: Not on file  Stress: Not on file  Social Connections: Not on file  Intimate Partner Violence: Not on file    History reviewed. No pertinent family history. Allergies  Allergen Reactions   Baclofen    Lisinopril    Remeron [Mirtazapine]    Vancomycin    Zofran [Ondansetron]   Meds before admission Alogliptin Amlodipine Eliquis Carvedilol Chlorthalidone Cholecalciferol Cyclosporin 75 mg twice daily Duloxetine Finasteride Lacosamide  losartan Mycophenolate 180 mg twice a day Sodium bicarbonate Tamsulosin Torsemide  I? Current Facility-Administered Medications  Medication Dose Route Frequency Provider Last Rate Last Admin   0.9 %  sodium chloride infusion   Intravenous Continuous Val Riles, MD 75 mL/hr at 01/14/21 [phone]  Rate Verify at 01/14/21 0610   apixaban (ELIQUIS) tablet 5 mg  5 mg Oral BID Cox, Amy N, DO   5 mg at 01/13/21 2118   atorvastatin (LIPITOR) tablet 40 mg  40 mg Oral QHS Cox, Amy N, DO   40 mg at 01/13/21 2118   azithromycin (ZITHROMAX) 500 mg in sodium chloride 0.9 % 250 mL IVPB  500 mg Intravenous Q24H Val Riles, MD 250 mL/hr at 01/14/21 1355 500 mg at 01/14/21 1355   bisacodyl (DULCOLAX) suppository 10 mg  10 mg Rectal Once Val Riles, MD       carvedilol (COREG) tablet 50 mg  50  mg Oral BID WC Val Riles, MD   50 mg at 01/12/21 0925   cefTRIAXone (ROCEPHIN) 1 g in sodium chloride 0.9 % 100 mL IVPB  1 g Intravenous Q24H Val Riles, MD 200 mL/hr at 01/14/21 1635 1 g at 01/14/21 1635   [START ON 01/15/2021] Chlorhexidine Gluconate Cloth 2 % PADS 6 each  6 each Topical Q0600 Cox, Amy N, DO       cholecalciferol (VITAMIN D3) tablet 2,000 Units  2,000 Units Oral Daily Cox, Amy N, DO   2,000 Units at 01/12/21 8768   cycloSPORINE (SANDIMMUNE) capsule 75 mg  75 mg Oral BID Val Riles, MD   75 mg at 01/13/21 2229   diltiazem (CARDIZEM) tablet 30 mg  30 mg Oral Q6H PRN Val Riles, MD       DULoxetine (CYMBALTA) DR capsule 60 mg  60 mg Oral Daily Cox, Amy N, DO   60 mg at 01/12/21 0925   fluticasone furoate-vilanterol (BREO ELLIPTA) 200-25 MCG/ACT 1 puff  1 puff Inhalation Daily Val Riles, MD       haloperidol lactate (HALDOL) injection 2 mg  2 mg Intravenous Q6H PRN Darel Hong D, NP   2 mg at 01/13/21 2342   hydrALAZINE (APRESOLINE) injection 10 mg  10 mg Intravenous Q6H PRN Val Riles, MD   10 mg at 01/13/21 0929   hydrOXYzine (ATARAX) tablet 25 mg  25 mg Oral TID Val Riles, MD   25 mg at 01/13/21 2118   insulin aspart (novoLOG) injection 0-5 Units  0-5 Units Subcutaneous QHS Cox, Amy N, DO       insulin aspart (novoLOG) injection 0-9 Units  0-9 Units Subcutaneous TID WC Cox, Amy N, DO   1 Units at 01/14/21 1306   ipratropium-albuterol (DUONEB) 0.5-2.5 (3) MG/3ML nebulizer solution 3 mL  3 mL Nebulization Q6H Val Riles, MD   3 mL at 01/14/21 1444   lacosamide (VIMPAT) 150 mg in sodium chloride 0.9 % 25 mL IVPB  150 mg Intravenous Q12H Greta Doom, MD   Stopped at 01/14/21 0949   linagliptin (TRADJENTA) tablet 5 mg  5 mg Oral Daily Dorothe Pea, RPH   5 mg at 01/12/21 0926   LORazepam (ATIVAN) injection 2 mg  2 mg Intravenous Q4H PRN Bradly Bienenstock, NP       losartan (COZAAR) tablet 50 mg  50 mg Oral Daily Val Riles, MD   50 mg at  01/12/21 0925   MEDLINE mouth rinse  15 mL Mouth Rinse BID Cox, Amy N, DO       methylPREDNISolone sodium succinate (SOLU-MEDROL) 40 mg/mL injection 40 mg  40 mg Intravenous Q12H Val Riles, MD       metoprolol tartrate (LOPRESSOR) injection 5 mg  5 mg Intravenous Q6H PRN Val Riles, MD   5 mg at 01/13/21 [phone]  mycophenolate (MYFORTIC) EC tablet 180 mg  180 mg Oral BID Cox, Amy N, DO   180 mg at 01/13/21 2123   nicardipine (CARDENE) 64m in 0.86% saline 2050mIV infusion (0.1 mg/ml)  3-15 mg/hr Intravenous Continuous KuVal RilesMD 30 mL/hr at 01/14/21 1326 3 mg/hr at 01/14/21 1326   ondansetron (ZOFRAN) tablet 4 mg  4 mg Oral Q6H PRN Cox, Amy N, DO       Or   ondansetron (ZOFRAN) injection 4 mg  4 mg Intravenous Q6H PRN Cox, Amy N, DO       tamsulosin (FLOMAX) capsule 0.8 mg  0.8 mg Oral QPC supper Cox, Amy N, DO   0.8 mg at 01/13/21 2126     Abtx:  Anti-infectives (From admission, onward)    Start     Dose/Rate Route Frequency Ordered Stop   01/13/21 1200  cefTRIAXone (ROCEPHIN) 1 g in sodium chloride 0.9 % 100 mL IVPB        1 g 200 mL/hr over 30 Minutes Intravenous Every 24 hours 01/13/21 1053     01/13/21 1200  azithromycin (ZITHROMAX) 500 mg in sodium chloride 0.9 % 250 mL IVPB        500 mg 250 mL/hr over 60 Minutes Intravenous Every 24 hours 01/13/21 1053 01/18/21 1159       REVIEW OF SYSTEMS:  Not reliable Not available Says he does not walk  Objective:  VITALS:  BP (!) 143/81    Pulse 82    Temp 98.3 F (36.8 C) (Axillary)    Resp 17    Ht '5\' 9"'  (1.753 m)    Wt 70.8 kg    SpO2 98%    BMI 23.05 kg/m  PHYSICAL EXAM:  General: awake, oriented in person, year- does not know the place, answers questions like he had renal transplant in [place] Follows commands like moving his limbs Head: Normocephalic, without obvious abnormality, atraumatic. Eyes: Conjunctivae clear, anicteric sclerae. Pupils are equal ENT Nares normal. No drainage or sinus tenderness. Lips,  mucosa, and tongue normal. No Thrush Neck: Supple, symmetrical, no adenopathy, thyroid: non tender no carotid bruit and no JVD. Lungs: b/l air entry. Heart: irregular Abdomen: Soft, non-tender,not distended. Bowel sounds normal. No masses Extremities: rt great toe partial amputation Skin: No rashes or lesions. Or bruising Lymph: Cervical, supraclavicular normal. Neurologic: flexion contractures of finges Wasting of interrosei Pertinent Labs Lab Results CBC    Component Value Date/Time   WBC 3.5 (L) 01/14/2021 0706   RBC 2.96 (L) 01/14/2021 0706   HGB 7.1 (L) 01/14/2021 0706   HCT 22.4 (L) 01/14/2021 0706   PLT 224 01/14/2021 0706   MCV 75.7 (L) 01/14/2021 0706   MCH 24.0 (L) 01/14/2021 0706   MCHC 31.7 01/14/2021 0706   RDW 25.1 (H) 01/14/2021 0706   LYMPHSABS 0.4 (L) 09/30/2020 1539   MONOABS 0.2 09/30/2020 1539   EOSABS 0.1 09/30/2020 1539   BASOSABS 0.0 09/30/2020 1539    CMP Latest Ref Rng & Units 01/14/2021 01/13/2021 01/12/2021  Glucose 70 - 99 mg/dL 164(H) 134(H) 124(H)  BUN 8 - 23 mg/dL 81(H) 71(H) 77(H)  Creatinine 0.61 - 1.24 mg/dL 3.12(H) 2.82(H) 3.01(H)  Sodium 135 - 145 mmol/L 135 137 141  Potassium 3.5 - 5.1 mmol/L 3.9 3.6 3.6  Chloride 98 - 111 mmol/L 106 105 105  CO2 22 - 32 mmol/L 18(L) 21(L) 23  Calcium 8.9 - 10.3 mg/dL 8.5(L) 8.6(L) 8.8(L)  Total Protein 6.5 - 8.1 g/dL - - -  Total Bilirubin 0.3 - 1.2 mg/dL - - -  Alkaline Phos 38 - 126 U/L - - -  AST 15 - 41 U/L - - -  ALT 0 - 44 U/L - - -      Microbiology: Recent Results (from the past 240 hour(s))  Resp Panel by RT-PCR (Flu A&B, Covid) Nasopharyngeal Swab     Status: None   Collection Time: 01/11/21  1:11 AM   Specimen: Nasopharyngeal Swab; Nasopharyngeal(NP) swabs in vial transport medium  Result Value Ref Range Status   SARS Coronavirus 2 by RT PCR NEGATIVE NEGATIVE Final    Comment: (NOTE) SARS-CoV-2 target nucleic acids are NOT DETECTED.  The SARS-CoV-2 RNA is generally detectable in  upper respiratory specimens during the acute phase of infection. The lowest concentration of SARS-CoV-2 viral copies this assay can detect is 138 copies/mL. A negative result does not preclude SARS-Cov-2 infection and should not be used as the sole basis for treatment or other patient management decisions. A negative result may occur with  improper specimen collection/handling, submission of specimen other than nasopharyngeal swab, presence of viral mutation(s) within the areas targeted by this assay, and inadequate number of viral copies(<138 copies/mL). A negative result must be combined with clinical observations, patient history, and epidemiological information. The expected result is Negative.  Fact Sheet for Patients:  EntrepreneurPulse.com.au  Fact Sheet for Healthcare Providers:  IncredibleEmployment.be  This test is no t yet approved or cleared by the [place] FDA and  has been authorized for detection and/or diagnosis of SARS-CoV-2 by FDA under an Emergency Use Authorization (EUA). This EUA will remain  in effect (meaning this test can be used) for the duration of the COVID-19 declaration under Section 564(b)(1) of the Act, 21 U.S.C.section 360bbb-3(b)(1), unless the authorization is terminated  or revoked sooner.       Influenza A by PCR NEGATIVE NEGATIVE Final   Influenza B by PCR NEGATIVE NEGATIVE Final    Comment: (NOTE) The Xpert Xpress SARS-CoV-2/FLU/RSV plus assay is intended as an aid in the diagnosis of influenza from Nasopharyngeal swab specimens and should not be used as a sole basis for treatment. Nasal washings and aspirates are unacceptable for Xpert Xpress SARS-CoV-2/FLU/RSV testing.  Fact Sheet for Patients: EntrepreneurPulse.com.au  Fact Sheet for Healthcare Providers: IncredibleEmployment.be  This test is not yet approved or cleared by the [place] FDA and has been  authorized for detection and/or diagnosis of SARS-CoV-2 by FDA under an Emergency Use Authorization (EUA). This EUA will remain in effect (meaning this test can be used) for the duration of the COVID-19 declaration under Section 564(b)(1) of the Act, 21 U.S.C. section 360bbb-3(b)(1), unless the authorization is terminated or revoked.  Performed at [facility], [address] [place], [place] [address]   Urine Culture     Status: Abnormal (Preliminary result)   Collection Time: 01/13/21  9:48 AM   Specimen: In/Out Cath Urine  Result Value Ref Range Status   Specimen Description   Final    IN/OUT CATH URINE Performed at [facility], [address], [place], [place] [address]    Special Requests   Final    NONE Performed at [facility], [address] [place], [place] [address]    Culture (A)  Final    >=100,000 COLONIES/mL GRAM NEGATIVE RODS >=100,000 COLONIES/mL KLEBSIELLA PNEUMONIAE CULTURE REINCUBATED FOR BETTER GROWTH Performed at [facility], [place] [address] [place], [place] [address]    Report Status PENDING  Incomplete  Respiratory (~20 pathogens) panel by PCR     Status: None   Collection Time: 01/13/21  1:59 PM   Specimen: Nasopharyngeal Swab; Respiratory  Result Value Ref Range Status   Adenovirus NOT DETECTED NOT DETECTED Final   Coronavirus 229E NOT DETECTED NOT DETECTED Final    Comment: (NOTE) The Coronavirus on the Respiratory Panel, DOES NOT test for the novel  Coronavirus (2019 nCoV)    Coronavirus HKU1 NOT DETECTED NOT DETECTED Final   Coronavirus NL63 NOT DETECTED NOT DETECTED Final   Coronavirus OC43 NOT DETECTED NOT DETECTED Final   Metapneumovirus NOT DETECTED NOT DETECTED Final   Rhinovirus / Enterovirus NOT DETECTED NOT DETECTED Final   Influenza A NOT DETECTED NOT DETECTED Final   Influenza B NOT DETECTED NOT DETECTED Final   Parainfluenza Virus 1 NOT DETECTED NOT DETECTED Final   Parainfluenza Virus 2 NOT  DETECTED NOT DETECTED Final   Parainfluenza Virus 3 NOT DETECTED NOT DETECTED Final   Parainfluenza Virus 4 NOT DETECTED NOT DETECTED Final   Respiratory Syncytial Virus NOT DETECTED NOT DETECTED Final   Bordetella pertussis NOT DETECTED NOT DETECTED Final   Bordetella Parapertussis NOT DETECTED NOT DETECTED Final   Chlamydophila pneumoniae NOT DETECTED NOT DETECTED Final   Mycoplasma pneumoniae NOT DETECTED NOT DETECTED Final    Comment: Performed at [facility], [place]. [address], [place], [place] [address]  MRSA Next Gen by PCR, Nasal     Status: None   Collection Time: 01/14/21 11:53 AM   Specimen: Nasal Mucosa; Nasal Swab  Result Value Ref Range Status   MRSA by PCR Next Gen NOT DETECTED NOT DETECTED Final    Comment: (NOTE) The GeneXpert MRSA Assay (FDA approved for NASAL specimens only), is one component of a comprehensive MRSA colonization surveillance program. It is not intended to diagnose MRSA infection nor to guide or monitor treatment for MRSA infections. Test performance is not FDA approved in patients less than 73 years old. Performed at [facility], [address] [place], [place] [address]     IMAGING RESULTS:  I have personally reviewed the films ?Multiple patchy airspace opacities are noted in both upper lobes concerning for multifocal pneumonia.  Impression/Recommendation 66 year old male with history of renal transplant on cyclosporine and mycophenolate, hypertension, diabetes mellitus, atrial fibrillation, CVA with vascular stroke, CAD and seizure history admitted with seizures    seizures a- improved the first 2 days of hospitalization followed by agitation restlessness shortness of breath and had to be on BiPAP  He is awake and more alert and follows commands now This is not clinically meningitis or encephalitis. Rule out cyclosporine toxicity-Need to get a level as this can cause seizures Also patient was hypertensive and had to get  nicardipine. Need to rule out PRES Recommend MRI of the brain  Bilateral upper lobe infiltrate Rule out aspiration Okay to treat with ceftriaxone and azithromycin.  May add Flagyl   Hypertension  End-stage renal disease status post transplant in 2017 On cyclosporine and mycophenolate Currently creatinine is 3.12  Afib on Eliquis, coreg  Anemia and mild leukopenia could be secondary to mycophenolate  H/o CVA- followed at [facility]   ? ? ___________________________________________________ Discussed with patient,and his nurse Note:  This document was prepared using Dragon voice recognition software and may include unintentional dictation errors.

## 2021-01-15 ENCOUNTER — Inpatient Hospital Stay: Payer: No Typology Code available for payment source

## 2021-01-15 LAB — BPAM RBC
Blood Product Expiration Date: 202301152359
ISSUE DATE / TIME: 202301071340
Unit Type and Rh: 7300

## 2021-01-15 LAB — TYPE AND SCREEN
ABO/RH(D): B POS
Antibody Screen: NEGATIVE
Unit division: 0

## 2021-01-15 LAB — PHOSPHORUS: Phosphorus: 5.6 mg/dL — ABNORMAL HIGH (ref 2.5–4.6)

## 2021-01-15 LAB — CBC
HCT: 27.7 % — ABNORMAL LOW (ref 39.0–52.0)
Hemoglobin: 9.2 g/dL — ABNORMAL LOW (ref 13.0–17.0)
MCH: 25.3 pg — ABNORMAL LOW (ref 26.0–34.0)
MCHC: 33.2 g/dL (ref 30.0–36.0)
MCV: 76.3 fL — ABNORMAL LOW (ref 80.0–100.0)
Platelets: 214 10*3/uL (ref 150–400)
RBC: 3.63 MIL/uL — ABNORMAL LOW (ref 4.22–5.81)
RDW: 23.4 % — ABNORMAL HIGH (ref 11.5–15.5)
WBC: 4.9 10*3/uL (ref 4.0–10.5)
nRBC: 0 % (ref 0.0–0.2)

## 2021-01-15 LAB — BASIC METABOLIC PANEL
Anion gap: 14 (ref 5–15)
BUN: 85 mg/dL — ABNORMAL HIGH (ref 8–23)
CO2: 19 mmol/L — ABNORMAL LOW (ref 22–32)
Calcium: 8.3 mg/dL — ABNORMAL LOW (ref 8.9–10.3)
Chloride: 106 mmol/L (ref 98–111)
Creatinine, Ser: 3.23 mg/dL — ABNORMAL HIGH (ref 0.61–1.24)
GFR, Estimated: 21 mL/min — ABNORMAL LOW (ref >60)
Glucose, Bld: 237 mg/dL — ABNORMAL HIGH (ref 70–99)
Potassium: 3.6 mmol/L (ref 3.5–5.1)
Sodium: 139 mmol/L (ref 135–145)

## 2021-01-15 LAB — GLUCOSE, CAPILLARY
Glucose-Capillary: 195 mg/dL — ABNORMAL HIGH (ref 70–99)
Glucose-Capillary: 210 mg/dL — ABNORMAL HIGH (ref 70–99)
Glucose-Capillary: 166 mg/dL — ABNORMAL HIGH (ref 70–99)
Glucose-Capillary: 157 mg/dL — ABNORMAL HIGH (ref 70–99)

## 2021-01-15 LAB — MAGNESIUM: Magnesium: 1.8 mg/dL (ref 1.7–2.4)

## 2021-01-15 LAB — OCCULT BLOOD X 1 CARD TO LAB, STOOL: Fecal Occult Bld: NEGATIVE

## 2021-01-15 LAB — PROCALCITONIN: Procalcitonin: 0.14 ng/mL

## 2021-01-15 LAB — STREP PNEUMONIAE URINARY ANTIGEN: Strep Pneumo Urinary Antigen: NEGATIVE

## 2021-01-15 LAB — VITAMIN D 25 HYDROXY (VIT D DEFICIENCY, FRACTURES): Vit D, 25-Hydroxy: 46.15 ng/mL (ref 30–100)

## 2021-01-15 MED ORDER — PREDNISONE 20 MG PO TABS
20.0000 mg | ORAL_TABLET | Freq: Every day | ORAL | Status: AC
  Administered 2021-01-18: 20 mg via ORAL
  Filled 2021-01-15: qty 1

## 2021-01-15 MED ORDER — FINASTERIDE 5 MG PO TABS
5.0000 mg | ORAL_TABLET | Freq: Every day | ORAL | Status: DC
  Administered 2021-01-15 – 2021-01-19 (×5): 5 mg via ORAL
  Filled 2021-01-15 (×5): qty 1

## 2021-01-15 MED ORDER — PREDNISONE 20 MG PO TABS
30.0000 mg | ORAL_TABLET | Freq: Every day | ORAL | Status: AC
  Administered 2021-01-17: 10:00:00 30 mg via ORAL
  Filled 2021-01-15: qty 1

## 2021-01-15 MED ORDER — AZITHROMYCIN 500 MG PO TABS
500.0000 mg | ORAL_TABLET | Freq: Every day | ORAL | Status: DC
  Administered 2021-01-16: 500 mg via ORAL
  Filled 2021-01-15: qty 1

## 2021-01-15 MED ORDER — IPRATROPIUM-ALBUTEROL 0.5-2.5 (3) MG/3ML IN SOLN: 3.0000 mL | Freq: Four times a day (QID) | RESPIRATORY_TRACT | Status: DC | PRN

## 2021-01-15 MED ORDER — AZITHROMYCIN 500 MG PO TABS
500.0000 mg | ORAL_TABLET | Freq: Every day | ORAL | Status: AC
  Administered 2021-01-15: 500 mg via ORAL
  Filled 2021-01-15: qty 1

## 2021-01-15 MED ORDER — FOLIC ACID 1 MG PO TABS
1.0000 mg | ORAL_TABLET | Freq: Every day | ORAL | Status: DC
  Administered 2021-01-15 – 2021-01-19 (×5): 1 mg via ORAL
  Filled 2021-01-15 (×5): qty 1

## 2021-01-15 MED ORDER — SODIUM BICARBONATE 650 MG PO TABS
650.0000 mg | ORAL_TABLET | Freq: Three times a day (TID) | ORAL | Status: DC
  Administered 2021-01-15 – 2021-01-19 (×14): 650 mg via ORAL
  Filled 2021-01-15 (×16): qty 1

## 2021-01-15 MED ORDER — AMLODIPINE BESYLATE 10 MG PO TABS
10.0000 mg | ORAL_TABLET | Freq: Every day | ORAL | Status: DC
  Administered 2021-01-15 – 2021-01-19 (×5): 10 mg via ORAL
  Filled 2021-01-15 (×5): qty 1

## 2021-01-15 MED ORDER — LABETALOL HCL 200 MG PO TABS
200.0000 mg | ORAL_TABLET | Freq: Three times a day (TID) | ORAL | Status: DC
  Administered 2021-01-15 – 2021-01-19 (×14): 200 mg via ORAL
  Filled 2021-01-15 (×16): qty 1

## 2021-01-15 MED ORDER — PREDNISONE 20 MG PO TABS
40.0000 mg | ORAL_TABLET | Freq: Every day | ORAL | Status: AC
  Administered 2021-01-16: 40 mg via ORAL
  Filled 2021-01-15: qty 2

## 2021-01-15 MED ORDER — PREDNISONE 10 MG PO TABS
10.0000 mg | ORAL_TABLET | Freq: Every day | ORAL | Status: AC
  Administered 2021-01-19: 10 mg via ORAL
  Filled 2021-01-15: qty 1

## 2021-01-15 NOTE — Progress Notes (Signed)
Per MD, Cardene drip to be dc'd after given Labetolol. Restart if systolic is greater than 488Q. Patient currently off Cardene drip and maintaining blood pressures within parameters. Will continue to monitor.

## 2021-01-15 NOTE — Evaluation (Signed)
Occupational Therapy Evaluation Patient Details Name: Jared Gregory MRN: [id number] DOB: May 21, 1955 Today's Date: 01/15/2021   History of Present Illness Jared Gregory is a 66 y.o. male with medical history significant for anxiety, depression, hyperlipidemia, atrial fibrillation on Coreg and Eliquis, cva in January 03, 2020, history of seizures on lacosamide, ESRD status post kidney transplant about 5 years ago in [place], who presents emergency department for chief concerns of seizure activity from facility [facility]).   Clinical Impression   Pt seen for OT evaluation this date. RN reporting pt with improved alertness this date and able to maintain alertness while eating breakfast. Upon arrival to room, pt appearing drowsy and only intermittently responding to questions with 1-word answers before going back to sleep. Pt responded to name and was able to report he is in a hospital, however this [place] was unable to obtain PLOF and formally assess strength, sensation, and vision d/t pt's level of alertness. Per chart review, pt was residing at [facility]. RN reports that pt stated he uses adaptive utensils for feeding. Plan to obtain PLOF from family when able. Pt currently requires MAX A for feeding self with built-up spoon, MAX A for supine<>sit transfer with HOB elevated, and MAX A for washing face while seated EOB. Pt requires MOD-MAX A for static sitting balance at EOB. Pt currently presents with impaired cognition, decreased strength, and decreased activity tolerance and would benefit from additional skilled OT services to maximize return to PLOF and minimize risk of future falls, injury, and readmission. Upon discharge, recommend SNF.      Recommendations for follow up therapy are one component of a multi-disciplinary discharge planning process, led by the attending physician.  Recommendations may be updated based on patient status, additional functional criteria and insurance  authorization.   Follow Up Recommendations  Skilled nursing-short term rehab (<3 hours/day)    Assistance Recommended at Discharge Frequent or constant Supervision/Assistance  Patient can return home with the following Two people to help with bathing/dressing/bathroom;Assistance with feeding;Two people to help with walking and/or transfers    Functional Status Assessment  Patient has had a recent decline in their functional status and demonstrates the ability to make significant improvements in function in a reasonable and predictable amount of time.  Equipment Recommendations  Other (comment) (defer to next venue of care)       Precautions / Restrictions Precautions Precautions: Fall Precaution Comments: seizure Restrictions Weight Bearing Restrictions: No      Mobility Bed Mobility Overal bed mobility: Needs Assistance Bed Mobility: Sit to Supine;Supine to Sit;Rolling Rolling: Max assist   Supine to sit: Max assist Sit to supine: Max assist;+2 for physical assistance;HOB elevated   General bed mobility comments: Following multi-modal cues, pt initiates LE movement toward/away from EOB, however ultimately requires MAX A of 1-person to complete transfer. Requires +2 person assist to scoot toward HOB once supine    Transfers                   General transfer comment: unable to safely attempt transfer at this time      Balance Overall balance assessment: Needs assistance Sitting-balance support: Feet unsupported;Bilateral upper extremity supported Sitting balance-Leahy Scale: Poor Sitting balance - Comments: Pt requires MOD-MAX A to maintain balance at EOB Postural control: Left lateral lean     Standing balance comment: unable to attempt stand at this time  ADL either performed or assessed with clinical judgement   ADL Overall ADL's : Needs assistance/impaired Eating/Feeding: Maximal assistance;Bed level Eating/Feeding  Details (indicate cue type and reason): With built-up spoon, pt able to grasp utensil with R hand, however requires MAX A to bring to mouth Grooming: Wash/dry face;Maximal assistance;Sitting Grooming Details (indicate cue type and reason): While sitting EOB, requires assist to maintain upright posture and wash face with washcloth via HHA                                     Vision Baseline Vision/History:  (unable to formally assess d/t pt's cognition and eyes only briefly open)              Pertinent Vitals/Pain Pain Assessment: CPOT Facial Expression: Relaxed, neutral Body Movements: Absence of movements Muscle Tension: Relaxed Compliance with ventilator (intubated pts.): N/A Vocalization (extubated pts.): Talking in normal tone or no sound CPOT Total: 0     Hand Dominance Right   Extremity/Trunk Assessment Upper Extremity Assessment Upper Extremity Assessment: Difficult to assess due to impaired cognition (pt inconsistently responding to cues for movement)   Lower Extremity Assessment Lower Extremity Assessment: Difficult to assess due to impaired cognition (pt inconsistently responding to cues for movement)       Communication Communication Communication: Other (comment) (too drowsy to communicate effectively)   Cognition Arousal/Alertness: Lethargic Behavior During Therapy: Flat affect Overall Cognitive Status: No family/caregiver present to determine baseline cognitive functioning                                 General Comments: Patient is extremely drowsy and intermittantly responds with 1-word before going back to sleep. Pt responds to name and is able to report he is in a hospital. Requires MAX multi-modal cues throughout session                [place] expects to be discharged to:: Skilled nursing facility                                 Additional Comments: Patient is from [place] Gregory  where he lives      Prior Functioning/Environment Prior Level of Function : Patient poor historian/Family not available (Patient is not able to provide history or prior level of function. Per chart review, pt lives in long term care at [facility] (SNF). Per RN, pt reports using adaptive utensils)                        OT Problem List: Decreased strength;Decreased activity tolerance;Impaired balance (sitting and/or standing);Decreased cognition      OT Treatment/Interventions: Self-care/ADL training;Therapeutic exercise;DME and/or AE instruction;Therapeutic activities;Patient/family education;Balance training    OT Goals(Current goals can be found in the care plan section) Acute Rehab OT Goals OT Goal Formulation: Patient unable to participate in goal setting Time For Goal Achievement: 01/29/21 Potential to Achieve Goals: Good ADL Goals Pt Will Perform Eating: with mod assist;bed level;with adaptive utensils Pt Will Perform Grooming: with mod assist;sitting Additional ADL Goal #1: Pt will maintain attention for >3 mins while engaging in bed-level ADLs  OT Frequency: Min 2X/week       AM-PAC OT "6 Clicks" Daily Activity     Outcome Measure  Help from another person eating meals?: A Lot Help from another person taking care of personal grooming?: A Lot Help from another person toileting, which includes using toliet, bedpan, or urinal?: Total Help from another person bathing (including washing, rinsing, drying)?: Total Help from another person to put on and taking off regular upper body clothing?: A Lot Help from another person to put on and taking off regular lower body clothing?: A Lot 6 Click Score: 10   End of Session Nurse Communication: Mobility status  Activity Tolerance: Patient limited by lethargy Patient left: in bed;with call bell/phone within reach;with nursing/sitter in room;Other (comment) (with seizure pads re-applied on bedrails)  OT Visit Diagnosis:  Muscle weakness (generalized) (M62.81);Unsteadiness on feet (R26.81);Other symptoms and signs involving cognitive function                Time: 1139-1155 OT Time Calculation (min): 16 min Charges:  OT General Charges $OT Visit: 1 Visit OT Evaluation $OT Eval Moderate Complexity: [place], OTR/L [place]

## 2021-01-15 NOTE — Progress Notes (Signed)
PHARMACIST - PHYSICIAN COMMUNICATION  CONCERNING: Antibiotic IV to Oral Route Change Policy  RECOMMENDATION: This patient is receiving azithromycin by the intravenous route.  Based on criteria approved by the Pharmacy and Therapeutics Committee, the antibiotic(s) is/are being converted to the equivalent oral dose form(s).   DESCRIPTION: These criteria include: Patient being treated for a respiratory tract infection, urinary tract infection, cellulitis or clostridium difficile associated diarrhea if on metronidazole The patient is not neutropenic and does not exhibit a GI malabsorption state The patient is eating (either orally or via tube) and/or has been taking other orally administered medications for a least 24 hours The patient is improving clinically and has a Tmax < 100.5  If you have questions about this conversion, please contact the [place]  01/15/21

## 2021-01-15 NOTE — Progress Notes (Signed)
Subjective: He is more awake today than yesterday  Exam: Vitals:   01/15/21 1200 01/15/21 1201  BP: (!) 135/100 (!) 135/100  Pulse: 71 73  Resp: 13 12  Temp:  97.9 F (36.6 C)  SpO2: 96% 96%   Gen: In bed, NAD Resp: non-labored breathing, no acute distress Abd: soft, nt He resists passive movement in all extremities, including neck flexion, but when I ask him to he is able to pull his chin towards his chest.  Neuro: MS: He is awake, alert, answers questions, but still somewhat confused. CN: eyes conjugate, cross midline in both directions.  Motor: He has weakness of the right hand which is baseline, good strength proximally on the right, he has 4/5 weakness throughout the left side. Sensory: He endorses sensation bilaterally   Pertinent Labs: Creatinine 3.12, BUN 80 Urinalysis from yesterday, gram-negative rods and Klebsiella  EEG from yesterday with only slowing  Impression: 66 year old male with a history of seizures as well as vascular dementia with worsening mental status in the setting of pulmonary edema/pneumonia and urinary tract infection.  At this point, my suspicion for CNS infection is relatively low, and I favor his medical condition in the setting of vascular dementia is more likely causing a multifactorial delirium.  His improvement is encouraging, and at this point I feel this is more multifactorial delirium than any type of primary neurological process causing his worsening.  Recommendations: 1) continue lacosamide 150 twice daily 2) continue treatment of pneumonia/UTI per internal medicine.  Roland Rack, MD [facility] [phone]  If 7pm- 7am, please page neurology on call as listed in [place].

## 2021-01-15 NOTE — Progress Notes (Signed)
NAME:  Jared Gregory, MRN:  [id number], DOB:  November 20, 1955, LOS: 4 ADMISSION DATE:  01/10/2021, CONSULTATION DATE:  01/14/2020 REFERRING MD:  Dr. Dwyane Dee, CHIEF COMPLAINT:  Seizure, AMS, Acute Respiratory Distress   Brief Pt Description / Synopsis:  66 year old male admitted with concerns of seizure activity.  On 01/13/2021 developed worsening agitation, increased work of breathing requiring BiPAP.  Found to have multifocal pneumonia and UTI.  PCCM consulted for possible Precedex infusion.   Pertinent  Medical History  seizures on lacosamide ESRD status post kidney transplants about 5 years ago in [place] atrial fibrillation on Coreg and Eliquis CVA Anxiety Depression hyperlipidemia  Hypertension Diabetes Mellitus Vascular Dementia  Micro Data:  01/11/2021: 6 SARS-CoV-2 and influenza PCR>> negative 01/13/2021: Respiratory viral panel>> 01/13/2021: Blood culture x2>> 01/13/2021: Urine>> 01/13/2021: Strep pneumo urinary antigen>> 01/13/2021: Legionella urinary antigen>> 01/13/2021: Sputum>>  Antimicrobials:  Azithromycin 1/6>> Ceftriaxone 1/6>>  Significant Hospital Events: Including procedures, antibiotic start and stop dates in addition to other pertinent events   01/10/2021: Admitted by hospitalist for breakthrough seizures 01/11/2021: Evaluated by neurology, Vimpat dose increased.  EEG obtained without seizure activity 01/13/2021: Patient increasingly agitated and restless receiving multiple doses of Ativan.  Also noted to have increased work of breathing and hypertension of which she was placed on BiPAP and nicardipine drip.  PCCM asked to consult due to possible need for Precedex. 01/14/21 - patient has pneumonia with ckd post transplant. Possible seizures and meningitis due to neck stiffness and previoulsy lucid mentation. Neurology and ID consult placed today.  01/15/21- mentation is slighlty improved. Reviewed care plan with neurologist.     Objective   Blood pressure (!) 147/86, pulse 79,  temperature 97.8 F (36.6 C), temperature source Oral, resp. rate 18, height 5\' 9"  (1.753 m), weight 70.8 kg, SpO2 97 %.        Intake/Output Summary (Last 24 hours) at 01/15/2021 1028 Last data filed at 01/15/2021 0500 Gross per 24 hour  Intake 4393.34 ml  Output 600 ml  Net 3793.34 ml    Filed Weights   01/10/21 1926 01/14/21 1152  Weight: 76.2 kg 70.8 kg    Examination: General: Acutely ill-appearing male, sitting in bed, on BiPAP, no acute distress  HENT: Atraumatic, normocephalic, neck supple, no JVD Lungs: Coarse breath sounds bilaterally, mild expiratory wheeze, even, nonlabored Cardiovascular: Regular rate and rhythm, S1-S2, no murmurs, rubs, gallops Abdomen: Soft, nontender, nondistended, no guarding rebound tenderness, bowel sounds positive x4 Extremities: No deformities, no edema Neuro: Lethargic status post Ativan administration, arouses easily to voice and follows simple commands, no focal deficits noted, pupils PERRLA, unable to assess orientation due to altered mental status and BiPAP GU: Deferred    Assessment & Plan:   Acute Hypoxic Respiratory Failure in the setting of Multifocal Pneumonia (? Aspiration) -Supplemental O2 as needed to maintain O2 sats >92% -BiPAP, wean as tolerated -Follow intermittent CXR & ABG as needed -ABX as above -Bronchodilators -Continue Breo Ellipta -Aggressive pulmonary hygiene/toilet -Aspiration precautions  Multifocal Pneumonia & UTI -Monitor fever curve -Trend WBC's & Procalcitonin -Follow cultures as above -Continue empiric Azithromycin & Ceftriaxone pending cultures & sensitivities  Essential Hypertension Mildly Elevated Troponin, suspect demand ischemia PMHx: Atrial fibrillation on Apixaban -Continuous cardiac monitoring -Continue Coreg and Losartan -Nicardipine gtt as needed -HS Troponin peaked at 40 -Continue Eliquis -Echocardiogram 01/11/21: LVEF 60-65%, unable to evaluate diastolic parameters, RV function normal,  normal pulmonary artery systolic pressure, small pericardial effusion, moderate mitral stenosis  Acute Metabolic Encephalopathy in the setting of UTI &  Pneumonia & Seizures PMHx: Depression/Anxiety, seizures -Seizure precaution -Provide supportive care -Promote normal sleep/wake cycle -Treat infections as above -Avoid sedating meds as able -Prn Ativan and Haldol for severe agitation -CT Head negative for acute intracranial abnormality -MRI Brain negative for acute process -Urine drug screen positive for benzodiazepine -Continue Vimpat 150 mg twice daily -Seen by Neurology, has since signed off -EEG negative for seizures  AKI on CKD Stage IV PMHx: Renal Transplant -Monitor I&O's / urinary output -Follow BMP -Ensure adequate renal perfusion -Avoid nephrotoxic agents as able -Replace electrolytes as indicated -Continue cyclosporine and Mycophenolate -Consider nephrology consult   Best Practice (right click and "Reselect all SmartList Selections" daily)   Diet/type: Regular consistency (see orders) DVT prophylaxis: DOAC GI prophylaxis: N/A Lines: N/A Foley:  N/A Code Status:  full code Last date of multidisciplinary goals of care discussion [N/A]  Labs   CBC: Recent Labs  Lab 01/10/21 2020 01/12/21 0953 01/13/21 1105 01/14/21 0706 01/15/21 0303  WBC 5.5 6.9 7.7 3.5* 4.9  HGB 7.3* 8.1* 8.3* 7.1* 9.2*  HCT 24.1* 25.7* 25.9* 22.4* 27.7*  MCV 76.8* 76.3* 75.1* 75.7* 76.3*  PLT 156 179 207 224 214     Basic Metabolic Panel: Recent Labs  Lab 01/10/21 2020 01/11/21 0111 01/12/21 0953 01/13/21 1105 01/14/21 0706 01/15/21 0303  NA 139  --  141 137 135 139  K 4.4  --  3.6 3.6 3.9 3.6  CL 103  --  105 105 106 106  CO2 26  --  23 21* 18* 19*  GLUCOSE 169*  --  124* 134* 164* 237*  BUN 73*  --  77* 71* 81* 85*  CREATININE 3.17*  --  3.01* 2.82* 3.12* 3.23*  CALCIUM 8.7*  --  8.8* 8.6* 8.5* 8.3*  MG  --  1.5* 1.9 1.7 1.8 1.8  PHOS  --  4.4 4.5 4.1 6.1* 5.6*     GFR: Estimated Creatinine Clearance: 23.1 mL/min (A) (by C-G formula based on SCr of 3.23 mg/dL (H)). Recent Labs  Lab 01/12/21 0953 01/13/21 1105 01/14/21 0705 01/14/21 0706 01/15/21 0303  PROCALCITON  --   --  0.17  --  0.14  WBC 6.9 7.7  --  3.5* 4.9     Liver Function Tests: Recent Labs  Lab 01/10/21 2020  AST 16  ALT 7  ALKPHOS 51  BILITOT 1.0  PROT 7.7  ALBUMIN 2.8*    No results for input(s): LIPASE, AMYLASE in the last 168 hours. Recent Labs  Lab 01/11/21 0111  AMMONIA 13     ABG    Component Value Date/Time   PHART 7.43 01/13/2021 0947   PCO2ART 30 (L) 01/13/2021 0947   PO2ART 83 01/13/2021 0947   HCO3 19.9 (L) 01/13/2021 0947   ACIDBASEDEF 3.7 (H) 01/13/2021 0947   O2SAT 96.5 01/13/2021 0947      Coagulation Profile: No results for input(s): INR, PROTIME in the last 168 hours.  Cardiac Enzymes: No results for input(s): CKTOTAL, CKMB, CKMBINDEX, TROPONINI in the last 168 hours.  HbA1C: Hgb A1c MFr Bld  Date/Time Value Ref Range Status  01/11/2021 01:11 AM 5.2 4.8 - 5.6 % Final    Comment:    (NOTE) Pre diabetes:          5.7%-6.4%  Diabetes:              >6.4%  Glycemic control for   <7.0% adults with diabetes     CBG: Recent Labs  Lab 01/14/21 0753 01/14/21 1152  01/14/21 1731 01/14/21 2224 01/15/21 0724  GLUCAP 155* 144* 135* 145* 195*     Review of Systems:   Unable to assess due to AMS and BiPAP   Past Medical History:  He,  has a past medical history of Atrial fibrillation ([place]), Diabetes ([place]), HTN (hypertension), Hyperlipidemia, Renal transplant recipient, and Vascular dementia ([place]).   Surgical History:   Past Surgical History:  Procedure Laterality Date   KIDNEY TRANSPLANT       Social History:      Family History:  His family history is not on file.   Allergies Allergies  Allergen Reactions   Baclofen    Lisinopril    Remeron [Mirtazapine]    Vancomycin    Zofran [Ondansetron]       Home Medications  Prior to Admission medications   Medication Sig Start Date End Date Taking? Authorizing Provider  Alogliptin Benzoate 6.25 MG TABS Take 1 tablet by mouth daily. 07/29/20  Yes [provider]  atorvastatin (LIPITOR) 40 MG tablet Take 40 mg by mouth daily as needed. 07/29/20  Yes [provider]  carvedilol (COREG) 25 MG tablet Take 2 tablets by mouth 2 (two) times daily. 07/29/20  Yes [provider]  Cholecalciferol 25 MCG (1000 UT) tablet Take 2 tablets by mouth daily. 11/29/20  Yes [provider]  cycloSPORINE (SANDIMMUNE) 25 MG capsule Take 3 capsules by mouth 2 (two) times daily. 07/29/20  Yes [provider]  DULoxetine (CYMBALTA) 60 MG capsule Take 60 mg by mouth daily. 07/29/20  Yes [provider]  ELIQUIS 5 MG TABS tablet Take 5 mg by mouth 2 (two) times daily. 08/30/20  Yes [provider]  finasteride (PROSCAR) 5 MG tablet Take 5 mg by mouth daily. 07/29/20  Yes [provider]  Lacosamide 100 MG TABS Take 1 tablet by mouth every 12 (twelve) hours. 09/01/20  Yes [provider]  losartan (COZAAR) 100 MG tablet Take 100 mg by mouth daily. 07/29/20  Yes [provider]  mycophenolate (MYFORTIC) 180 MG EC tablet Take 180 mg by mouth 2 (two) times daily. 08/01/20  Yes [provider]  sodium bicarbonate 650 MG tablet Take 1 tablet by mouth 3 (three) times daily. 08/01/20  Yes [provider]  tamsulosin (FLOMAX) 0.4 MG CAPS capsule Take 0.8 mg by mouth daily. 07/29/20  Yes [provider]  torsemide (DEMADEX) 20 MG tablet Take 20 mg by mouth daily.   Yes [provider]  acetaminophen (TYLENOL) 325 MG tablet Take 2 tablets by mouth every 6 (six) hours as needed.    [provider]  amLODipine (NORVASC) 10 MG tablet Take 10 mg by mouth daily. Patient not taking: Reported on 01/10/2021 07/29/20   [provider]  Cholecalciferol (VITAMIN D3)  50 MCG (2000 UT) TABS Take 1 tablet by mouth daily. Patient not taking: Reported on 01/10/2021 07/29/20   [provider]     Critical care provider statement:   Total critical care time: 33 minutes   Performed by: Lanney Gins MD   Critical care time was exclusive of separately billable procedures and treating other patients.   Critical care was necessary to treat or prevent imminent or life-threatening deterioration.   Critical care was time spent personally by me on the following activities: development of treatment plan with patient and/or surrogate as well as nursing, discussions with consultants, evaluation of patient's response to treatment, examination of patient, obtaining history from patient or surrogate, ordering and performing treatments and  interventions, ordering and review of laboratory studies, ordering and review of radiographic studies, pulse oximetry and re-evaluation of patient's condition.    Ottie Glazier, M.D.  Pulmonary & Critical Care Medicine

## 2021-01-15 NOTE — Consult Note (Signed)
°[facility]  °CONSULT NOTE  ° ° °Date: 01/15/2021      °      °      °Patient Name:  Jared Gregory  MRN: [id number]  °DOB: 03/30/1955  Age / Sex: 65 y.o., male   °      °PCP: [facility]     °      °      °Service Requesting Consult: TRH     °      °      °Reason for Consult: Acute kidney injury     °      ° °History of Present Illness: °Jared Gregory is a 65 y.o.  male with past medical history consisting of diabetes type 2, hypertension, hep C, hyperlipidemia, anemia, multiple myeloma, and renal transplant in 2017, who was admitted to [facility] on 01/10/2021 for Seizure (HCC) [R56.9] °Seizure-like activity (HCC) [R56.9] °Syncope, unspecified syncope type [R55]  ° °Patient presented to the emergency department from facility with complaints of seizure-like activity.  Patient currently seen at bedside and ICU.  Currently working with physical therapy.  Chart review reveals patient has little memory of what happened at facility.  Was able to respond to questioning at that time.  Able to follow commands today but speech is delayed.  Currently on room air.  Tolerating small meals without nausea.  [facility]  Patient received [facility] transplant 5 years ago in [place].  Baseline creatinine appears to be 3.1 from 12/28/2020.  Home medications include cyclosporine and MMF. ° °Current labs work shows creatinine 3.2 with GFR 21. ° ° °Medications: °Outpatient medications: °Medications Prior to Admission  °Medication Sig Dispense Refill Last Dose  ° Alogliptin Benzoate 6.25 MG TABS Take 1 tablet by mouth daily.   01/10/2021 at 0922  ° atorvastatin (LIPITOR) 40 MG tablet Take 40 mg by mouth daily as needed.   01/10/2021 at 1627  ° carvedilol (COREG) 25 MG tablet Take 2 tablets by mouth 2 (two) times daily.   01/10/2021 at 1627  ° Cholecalciferol 25 MCG (1000 UT) tablet Take 2 tablets by mouth daily.   01/10/2021 at 0922  ° cycloSPORINE (SANDIMMUNE) 25 MG capsule Take  3 capsules by mouth 2 (two) times daily.   01/10/2021 at 1627  ° DULoxetine (CYMBALTA) 60 MG capsule Take 60 mg by mouth daily.   01/10/2021 at 0922  ° ELIQUIS 5 MG TABS tablet Take 5 mg by mouth 2 (two) times daily.   01/10/2021 at 0922  ° finasteride (PROSCAR) 5 MG tablet Take 5 mg by mouth daily.   01/10/2021 at 0922  ° Lacosamide 100 MG TABS Take 1 tablet by mouth every 12 (twelve) hours.   01/10/2021 at 0922  ° losartan (COZAAR) 100 MG tablet Take 100 mg by mouth daily.   01/10/2021 at 0922  ° mycophenolate (MYFORTIC) 180 MG EC tablet Take 180 mg by mouth 2 (two) times daily.   01/10/2021 at 1627  ° sodium bicarbonate 650 MG tablet Take 1 tablet by mouth 3 (three) times daily.   01/10/2021 at 1336  ° tamsulosin (FLOMAX) 0.4 MG CAPS capsule Take 0.8 mg by mouth daily.   01/09/2021 at 2116  ° torsemide (DEMADEX) 20 MG tablet Take 20 mg by mouth daily.   01/10/2021 at 0922  ° acetaminophen (TYLENOL) 325 MG tablet Take 2 tablets by mouth every 6 (six) hours as needed.   prn at unknown  °   amLODipine (NORVASC) 10 MG tablet Take 10 mg by mouth daily. (Patient not taking: Reported on 01/10/2021)   Not Taking  ° Cholecalciferol (VITAMIN D3) 50 MCG (2000 UT) TABS Take 1 tablet by mouth daily. (Patient not taking: Reported on 01/10/2021)   Not Taking  ° ° °Current medications: °Current Facility-Administered Medications  °Medication Dose Route Frequency Provider Last Rate Last Admin  ° 0.9 %  sodium chloride infusion   Intravenous Continuous Kumar, Dileep, MD 75 mL/hr at 01/14/21 2200 Infusion Verify at 01/14/21 2200  ° amLODipine (NORVASC) tablet 10 mg  10 mg Oral Daily Kumar, Dileep, MD   10 mg at 01/15/21 1120  ° apixaban (ELIQUIS) tablet 5 mg  5 mg Oral BID Cox, Amy N, DO   5 mg at 01/15/21 1120  ° atorvastatin (LIPITOR) tablet 40 mg  40 mg Oral QHS Cox, Amy N, DO   40 mg at 01/14/21 2250  ° [START ON 01/16/2021] azithromycin (ZITHROMAX) tablet 500 mg  500 mg Oral Daily Chappell, Alex B, RPH      ° cefTRIAXone (ROCEPHIN) 1 g in sodium chloride  0.9 % 100 mL IVPB  1 g Intravenous Q24H Kumar, Dileep, MD 200 mL/hr at 01/15/21 1233 1 g at 01/15/21 1233  ° Chlorhexidine Gluconate Cloth 2 % PADS 6 each  6 each Topical Q0600 Cox, Amy N, DO   6 each at 01/15/21 1000  ° cholecalciferol (VITAMIN D3) tablet 2,000 Units  2,000 Units Oral Daily Cox, Amy N, DO   2,000 Units at 01/15/21 1121  ° cycloSPORINE (SANDIMMUNE) capsule 75 mg  75 mg Oral BID Kumar, Dileep, MD   75 mg at 01/15/21 1224  ° diltiazem (CARDIZEM) tablet 30 mg  30 mg Oral Q6H PRN Kumar, Dileep, MD      ° DULoxetine (CYMBALTA) DR capsule 60 mg  60 mg Oral Daily Cox, Amy N, DO   60 mg at 01/15/21 1121  ° finasteride (PROSCAR) tablet 5 mg  5 mg Oral Daily Kumar, Dileep, MD   5 mg at 01/15/21 1120  ° fluticasone furoate-vilanterol (BREO ELLIPTA) 200-25 MCG/ACT 1 puff  1 puff Inhalation Daily Kumar, Dileep, MD   1 puff at 01/15/21 1122  ° folic acid (FOLVITE) tablet 1 mg  1 mg Oral Daily Kumar, Dileep, MD   1 mg at 01/15/21 1121  ° haloperidol lactate (HALDOL) injection 2 mg  2 mg Intravenous Q6H PRN Keene, Jeremiah D, NP   2 mg at 01/13/21 2342  ° hydrALAZINE (APRESOLINE) injection 10 mg  10 mg Intravenous Q6H PRN Kumar, Dileep, MD   10 mg at 01/13/21 0929  ° hydrOXYzine (ATARAX) tablet 25 mg  25 mg Oral TID Kumar, Dileep, MD   25 mg at 01/15/21 1123  ° insulin aspart (novoLOG) injection 0-5 Units  0-5 Units Subcutaneous QHS Cox, Amy N, DO      ° insulin aspart (novoLOG) injection 0-9 Units  0-9 Units Subcutaneous TID WC Cox, Amy N, DO   3 Units at 01/15/21 1225  ° ipratropium-albuterol (DUONEB) 0.5-2.5 (3) MG/3ML nebulizer solution 3 mL  3 mL Nebulization Q6H PRN Kumar, Dileep, MD      ° labetalol (NORMODYNE) tablet 200 mg  200 mg Oral TID Kumar, Dileep, MD   200 mg at 01/15/21 1224  ° lacosamide (VIMPAT) 150 mg in sodium chloride 0.9 % 25 mL IVPB  150 mg Intravenous Q12H Kirkpatrick, McNeill P, MD 80 mL/hr at 01/15/21 1346 150 mg at 01/15/21 1346  ° linagliptin (TRADJENTA) tablet   5 mg  5 mg Oral Daily  Dolan, Carissa E, RPH   5 mg at 01/15/21 1123  ° LORazepam (ATIVAN) injection 2 mg  2 mg Intravenous Q4H PRN Keene, Jeremiah D, NP      ° MEDLINE mouth rinse  15 mL Mouth Rinse BID Cox, Amy N, DO   15 mL at 01/15/21 1000  ° metoprolol tartrate (LOPRESSOR) injection 5 mg  5 mg Intravenous Q6H PRN Kumar, Dileep, MD   5 mg at 01/13/21 0818  ° mycophenolate (MYFORTIC) EC tablet 180 mg  180 mg Oral BID Cox, Amy N, DO   180 mg at 01/15/21 1123  ° nicardipine (CARDENE) 20mg in 0.86% saline 200ml IV infusion (0.1 mg/ml)  3-15 mg/hr Intravenous Continuous Rust-Chester, Britton L, NP 30 mL/hr at 01/15/21 0831 3 mg/hr at 01/15/21 0831  ° ondansetron (ZOFRAN) tablet 4 mg  4 mg Oral Q6H PRN Cox, Amy N, DO      ° Or  ° ondansetron (ZOFRAN) injection 4 mg  4 mg Intravenous Q6H PRN Cox, Amy N, DO      ° [START ON 01/16/2021] predniSONE (DELTASONE) tablet 40 mg  40 mg Oral Q breakfast Kumar, Dileep, MD      ° Followed by  ° [START ON 01/17/2021] predniSONE (DELTASONE) tablet 30 mg  30 mg Oral Q breakfast Kumar, Dileep, MD      ° Followed by  ° [START ON 01/18/2021] predniSONE (DELTASONE) tablet 20 mg  20 mg Oral Q breakfast Kumar, Dileep, MD      ° Followed by  ° [START ON 01/19/2021] predniSONE (DELTASONE) tablet 10 mg  10 mg Oral Q breakfast Kumar, Dileep, MD      ° sodium bicarbonate tablet 650 mg  650 mg Oral TID Kumar, Dileep, MD   650 mg at 01/15/21 1224  ° tamsulosin (FLOMAX) capsule 0.8 mg  0.8 mg Oral QPC supper Cox, Amy N, DO   0.8 mg at 01/13/21 2126  °  ° ° °Allergies: °Allergies  °Allergen Reactions  ° Baclofen   ° Lisinopril   ° Remeron [Mirtazapine]   ° Vancomycin   ° Zofran [Ondansetron]   °  ° ° °Past Medical History: °Past Medical History:  °Diagnosis Date  ° Atrial fibrillation (HCC)   ° Diabetes (HCC)   ° HTN (hypertension)   ° Hyperlipidemia   ° Renal transplant recipient   ° Vascular dementia (HCC)   ° ° ° °Past Surgical History: °Past Surgical History:  °Procedure Laterality Date  ° KIDNEY TRANSPLANT     ° ° ° °Family History: °History reviewed. No pertinent family history. ° ° °Social History: °Social History  ° °Socioeconomic History  ° Marital status: Married  °  Spouse name: Not on file  ° Number of children: Not on file  ° Years of education: Not on file  ° Highest education level: Not on file  °Occupational History  ° Not on file  °Tobacco Use  ° Smoking status: Not on file  ° Smokeless tobacco: Not on file  °Substance and Sexual Activity  ° Alcohol use: Not on file  ° Drug use: Not on file  ° Sexual activity: Not on file  °Other Topics Concern  ° Not on file  °Social History Narrative  ° Not on file  ° °Social Determinants of Health  ° °Financial Resource Strain: Not on file  °Food Insecurity: Not on file  °Transportation Needs: Not on file  °Physical Activity: Not on file  °Stress: Not on   file  Social Connections: Not on file  Intimate Partner Violence: Not on file     Review of Systems: Review of Systems  Unable to perform ROS: Mental status change   Vital Signs: Blood pressure (!) 135/100, pulse 73, temperature 97.9 F (36.6 C), temperature source Oral, resp. rate 12, height 5' 9" (1.753 m), weight 70.8 kg, SpO2 96 %.  Weight trends: Filed Weights   01/10/21 1926 01/14/21 1152  Weight: 76.2 kg 70.8 kg    Physical Exam: General: NAD  Head: Normocephalic, atraumatic. Moist oral mucosal membranes  Eyes: jaundiced  Lungs:  Clear to auscultation  Heart: Regular rate and rhythm  Abdomen:  Soft, nontender  Extremities:  no peripheral edema.  Neurologic: Nonfocal, moving all four extremities  Skin: No lesions        Lab results: Basic Metabolic Panel: Recent Labs  Lab 01/11/21 0111 01/12/21 0953 01/13/21 1105 01/14/21 0706 01/15/21 0303  NA  --  141 137 135 139  K  --  3.6 3.6 3.9 3.6  CL  --  105 105 106 106  CO2  --  23 21* 18* 19*  GLUCOSE  --  124* 134* 164* 237*  BUN  --  77* 71* 81* 85*  CREATININE  --  3.01* 2.82* 3.12* 3.23*  CALCIUM  --  8.8* 8.6* 8.5*  8.3*  MG 1.5* 1.9 1.7 1.8 1.8  PHOS 4.4 4.5 4.1 6.1* 5.6*    Liver Function Tests: Recent Labs  Lab 01/10/21 2020  AST 16  ALT 7  ALKPHOS 51  BILITOT 1.0  PROT 7.7  ALBUMIN 2.8*   No results for input(s): LIPASE, AMYLASE in the last 168 hours. Recent Labs  Lab 01/11/21 0111  AMMONIA 13    CBC: Recent Labs  Lab 01/10/21 2020 01/12/21 0953 01/13/21 1105 01/14/21 0706 01/15/21 0303  WBC 5.5 6.9 7.7 3.5* 4.9  HGB 7.3* 8.1* 8.3* 7.1* 9.2*  HCT 24.1* 25.7* 25.9* 22.4* 27.7*  MCV 76.8* 76.3* 75.1* 75.7* 76.3*  PLT 156 179 207 224 214    Cardiac Enzymes: No results for input(s): CKTOTAL, CKMB, CKMBINDEX, TROPONINI in the last 168 hours.  BNP: Invalid input(s): POCBNP  CBG: Recent Labs  Lab 01/14/21 1152 01/14/21 1731 01/14/21 2224 01/15/21 0724 01/15/21 1114  GLUCAP 144* 135* 145* 195* 210*    Microbiology: Results for orders placed or performed during the hospital encounter of 01/10/21  Resp Panel by RT-PCR (Flu A&B, Covid) Nasopharyngeal Swab     Status: None   Collection Time: 01/11/21  1:11 AM   Specimen: Nasopharyngeal Swab; Nasopharyngeal(NP) swabs in vial transport medium  Result Value Ref Range Status   SARS Coronavirus 2 by RT PCR NEGATIVE NEGATIVE Final    Comment: (NOTE) SARS-CoV-2 target nucleic acids are NOT DETECTED.  The SARS-CoV-2 RNA is generally detectable in upper respiratory specimens during the acute phase of infection. The lowest concentration of SARS-CoV-2 viral copies this assay can detect is 138 copies/mL. A negative result does not preclude SARS-Cov-2 infection and should not be used as the sole basis for treatment or other patient management decisions. A negative result may occur with  improper specimen collection/handling, submission of specimen other than nasopharyngeal swab, presence of viral mutation(s) within the areas targeted by this assay, and inadequate number of viral copies(<138 copies/mL). A negative result must  be combined with clinical observations, patient history, and epidemiological information. The expected result is Negative.  Fact Sheet for Patients:  EntrepreneurPulse.com.au  Fact Sheet for Healthcare Providers:  https://www.fda.gov/media/152162/download ° °This test is no t yet approved or cleared by the [place] FDA and  °has been authorized for detection and/or diagnosis of SARS-CoV-2 by °FDA under an Emergency Use Authorization (EUA). This EUA will remain  °in effect (meaning this test can be used) for the duration of the °COVID-19 declaration under Section 564(b)(1) of the Act, 21 °U.S.C.section 360bbb-3(b)(1), unless the authorization is terminated  °or revoked sooner.  ° ° °  ° Influenza A by PCR NEGATIVE NEGATIVE Final  ° Influenza B by PCR NEGATIVE NEGATIVE Final  °  Comment: (NOTE) °The Xpert Xpress SARS-CoV-2/FLU/RSV plus assay is intended as an aid °in the diagnosis of influenza from Nasopharyngeal swab specimens and °should not be used as a sole basis for treatment. Nasal washings and °aspirates are unacceptable for Xpert Xpress SARS-CoV-2/FLU/RSV °testing. ° °Fact Sheet for Patients: °https://www.fda.gov/media/152166/download ° °Fact Sheet for Healthcare Providers: °https://www.fda.gov/media/152162/download ° °This test is not yet approved or cleared by the [place] FDA and °has been authorized for detection and/or diagnosis of SARS-CoV-2 by °FDA under an Emergency Use Authorization (EUA). This EUA will remain °in effect (meaning this test can be used) for the duration of the °COVID-19 declaration under Section 564(b)(1) of the Act, 21 U.S.C. °section 360bbb-3(b)(1), unless the authorization is terminated or °revoked. ° °Performed at [facility], [address] [place], °[place] [address] °  °Urine Culture     Status: Abnormal (Preliminary result)  ° Collection Time: 01/13/21  9:48 AM  ° Specimen: In/Out Cath Urine  °Result Value Ref Range Status  °  Specimen Description   Final  °  IN/OUT CATH URINE °Performed at [facility], [address] [place], [place] [address] °  ° Special Requests   Final  °  NONE °Performed at  Hospital Lab, [address] [place], [place] [address] °  ° Culture (A)  Final  °  >=100,000 COLONIES/mL GRAM NEGATIVE RODS °>=100,000 COLONIES/mL KLEBSIELLA PNEUMONIAE °IDENTIFICATION AND SUSCEPTIBILITIES TO FOLLOW °Performed at [facility], [address] [place], [place] [address] °  ° Report Status PENDING  Incomplete  °Respiratory (~20 pathogens) panel by PCR     Status: None  ° Collection Time: 01/13/21  1:59 PM  ° Specimen: Nasopharyngeal Swab; Respiratory  °Result Value Ref Range Status  ° Adenovirus NOT DETECTED NOT DETECTED Final  ° Coronavirus 229E NOT DETECTED NOT DETECTED Final  °  Comment: (NOTE) °The Coronavirus on the Respiratory Panel, DOES NOT test for the novel  °Coronavirus (2019 nCoV) °  ° Coronavirus HKU1 NOT DETECTED NOT DETECTED Final  ° Coronavirus NL63 NOT DETECTED NOT DETECTED Final  ° Coronavirus OC43 NOT DETECTED NOT DETECTED Final  ° Metapneumovirus NOT DETECTED NOT DETECTED Final  ° Rhinovirus / Enterovirus NOT DETECTED NOT DETECTED Final  ° Influenza A NOT DETECTED NOT DETECTED Final  ° Influenza B NOT DETECTED NOT DETECTED Final  ° Parainfluenza Virus 1 NOT DETECTED NOT DETECTED Final  ° Parainfluenza Virus 2 NOT DETECTED NOT DETECTED Final  ° Parainfluenza Virus 3 NOT DETECTED NOT DETECTED Final  ° Parainfluenza Virus 4 NOT DETECTED NOT DETECTED Final  ° Respiratory Syncytial Virus NOT DETECTED NOT DETECTED Final  ° Bordetella pertussis NOT DETECTED NOT DETECTED Final  ° Bordetella Parapertussis NOT DETECTED NOT DETECTED Final  ° Chlamydophila pneumoniae NOT DETECTED NOT DETECTED Final  ° Mycoplasma pneumoniae NOT DETECTED NOT DETECTED Final  °  Comment: Performed at [facility], [address] [place], [place]   [address]  Culture, blood (Routine X 2) w Reflex to ID Panel      Status: None (Preliminary result)   Collection Time: 01/14/21  7:05 AM   Specimen: BLOOD RIGHT HAND  Result Value Ref Range Status   Specimen Description BLOOD RIGHT HAND  Final   Special Requests   Final    BOTTLES DRAWN AEROBIC AND ANAEROBIC Blood Culture adequate volume   Culture   Final    NO GROWTH < 24 HOURS Performed at [facility], [address] [place], [place] [address]    Report Status PENDING  Incomplete  Culture, blood (Routine X 2) w Reflex to ID Panel     Status: None (Preliminary result)   Collection Time: 01/14/21  7:11 AM   Specimen: BLOOD RIGHT HAND  Result Value Ref Range Status   Specimen Description BLOOD RIGHT HAND  Final   Special Requests   Final    BOTTLES DRAWN AEROBIC ONLY Blood Culture adequate volume   Culture   Final    NO GROWTH < 24 HOURS Performed at [facility], [address] [place], [place] [address]    Report Status PENDING  Incomplete  MRSA Next Gen by PCR, Nasal     Status: None   Collection Time: 01/14/21 11:53 AM   Specimen: Nasal Mucosa; Nasal Swab  Result Value Ref Range Status   MRSA by PCR Next Gen NOT DETECTED NOT DETECTED Final    Comment: (NOTE) The GeneXpert MRSA Assay (FDA approved for NASAL specimens only), is one component of a comprehensive MRSA colonization surveillance program. It is not intended to diagnose MRSA infection nor to guide or monitor treatment for MRSA infections. Test performance is not FDA approved in patients less than 49 years old. Performed at [facility], [address] [place], [place] [address]     Coagulation Studies: No results for input(s): LABPROT, INR in the last 72 hours.  Urinalysis: Recent Labs    01/13/21 0948  COLORURINE YELLOW*  LABSPEC 1.016  PHURINE 5.0  GLUCOSEU NEGATIVE  HGBUR MODERATE*  BILIRUBINUR NEGATIVE  KETONESUR NEGATIVE  PROTEINUR >=300*  NITRITE NEGATIVE  LEUKOCYTESUR LARGE*      Imaging: MR BRAIN WO  CONTRAST  Result Date: 01/15/2021 CLINICAL DATA:  66 year old male with altered mental status. Query posterior reversible encephalopathy syndrome (PRES). EXAM: MRI HEAD WITHOUT CONTRAST TECHNIQUE: Multiplanar, multiecho pulse sequences of the brain and surrounding structures were obtained without intravenous contrast. COMPARISON:  Brain MRI 01/11/2021. Head CT 01/13/2021. FINDINGS: The examination had to be discontinued prior to completion by patient request. Axial and coronal DWI and sagittal T1 weighted imaging only. No midline shift, mass effect, or evidence of intracranial mass lesion. No ventriculomegaly. Stable diffusion. Chronic lacunar infarcts in the deep gray matter nuclei, brainstem. Chronic cerebellar infarcts and confluent facilitated diffusion in the bilateral cerebral white matter. No restricted diffusion or evidence of acute infarction. Normal basilar cisterns. Negative pituitary and cervicomedullary junction. Visualized bone marrow signal is within normal limits. Grossly negative visible cervical spine. IMPRESSION: 1. Discontinued exam by patient request. Only DWI and sagittal T1 weighted imaging obtained today. 2. No evidence of acute infarct.  Advanced chronic ischemia. Electronically Signed   By: Genevie Ann M.D.   On: 01/15/2021 08:08     Assessment & Plan: Jared Gregory is a 65 y.o.  male with past medical history consisting of diabetes type 2, hypertension, hep C, hyperlipidemia, anemia, multiple myeloma, and renal transplant in 2017, who was admitted  to [facility] on 01/10/2021 for Seizure (HCC) [R56.9] °Seizure-like activity (HCC) [R56.9] °Syncope, unspecified syncope type [R55]  ° °Acute Kidney Injury on chronic kidney disease stage IV, status post renal  transplant in April 2017.Baseline creatinine 3.1  on 12/28/20.  °Acute kidney injury could be considered multifactorial from UTI, pneumonia, and suspected seizure activity °Losartan and diuretic currently held.  No IV contrast exposure.  No  indication for dialysis at this time.  Patient appears to be at [facility]  Agree with continuation of cyclosporine and MMF at home dosing.  Continue to avoid nephrotoxic agents and therapies, if possible.  Avoid hypotension.  We will continue to monitor but may not visit daily. ° °2. Anemia of chronic kidney disease °Lab Results  °Component Value Date  ° HGB 9.2 (L) 01/15/2021  °  °Hemoglobin at lower range of goal.  We will continue to monitor ° °LOS: 4 °  °1/8/20231:48 PM  °

## 2021-01-15 NOTE — Progress Notes (Addendum)
[facility] Progress Note  Patient: Jared Gregory    HAL:[id number]  DOA: 01/10/2021     Date of Service: the patient was seen and examined on 01/15/2021  Chief Complaint  Patient presents with   Seizures    Pt from [facility] had 3 witnessed seizures ranging from 30 seconds to 1 minute. Pt has hx of epilepsy. Given 2 of versed by EMS. Post ictal on arrival   Brief hospital course: Jared Gregory is a 66 y.o. male with medical history significant for anxiety, depression, hyperlipidemia, atrial fibrillation on Coreg and Eliquis, cva in January 03, 2020, history of seizures on lacosamide, ESRD status post kidney transplant about 5 years ago in [place], who presents emergency department for chief concerns of seizure activity from facility. ED Course: Discussed with emergency medicine provider, patient requiring hospitalization for chief concerns of seizure.  Vitals in the emergency department showed temperature of 97.4, respiration rate of 20, heart rate of 96, initial blood pressure 194/112, improved to 181/106, SPO2 100% on room air.  Serum sodium 139, potassium 4.4, chloride 103, bicarb 26, BUN is 18, serum creatinine 3.17, nonfasting blood glucose 169, WBC 5.5, hemoglobin 7.3, platelets of 156.  GFR of 21.  Troponin was 40.      Assessment and Plan:   Principal Problem:   Seizure ([place]) Active Problems:   Depression   Essential hypertension   On continuous oral anticoagulation   BPH (benign prostatic hyperplasia)    Metabolic encephalopathy multifactorial could be due to seizures, hypertensive urgency, pneumonia, COPD, UTI, Continue supportive care and treat underlying cause Continue aspiration and fall precautions MRI brain negative for any acute findings   # Seizure activity # Increased somnolence, Post ictal and due to ativan  Patient is status post Keppra 1 g IV - Increased Lacosamide 150 mg IV twice daily as per neurology, will transition to oral after improvement. -  Ativan injection 2 mg IV as needed for breakthrough seizures, 3 doses ordered with instructions for nursing staff to alert provider once IV Ativan has been used - continue aspiration, fall precautions, seizure precautions EEG negative for epilepsy Follow neurology for further recommendation and clearance for discharge   Acute respiratory failure secondary to community-acquired pneumonia Continue supplemental O2 inhalation and gradually wean off Started ceftriaxone and azithromycin CT chest Multiple patchy airspace opacities are noted in both upper lobes concerning for multifocal pneumonia. Small bilateral pleural effusions are noted with adjacent subsegmental atelectasis. Extensive coronary artery calcifications are noted suggesting coronary artery disease. respiratory viral panel negative, procalcitonin negative Follow urine antigen for strep and Legionella Follow blood cultures NGTD ID consult appreciated  COPD exacerbation secondary to underlying pneumonia S/p IV Solu-Medrol, on 1/8 started oral prednisone tapering dose Started DuoNeb every 6 hourly and Breo Ellipta inhaler daily Pulmonary critical care consulted for further management ABG reviewed Continue BiPAP as needed  Hypertensive urgency, blood pressure remained uncontrolled Home meds Coreg 50 twice daily, losartan 50 and amlodipine 10 mg  1/6 Cardine IV infusion was started, we will try to wean off today 1/8 discontinued Coreg and losartan Started labetalol 20 mg p.o. 3 times daily, resumed amlodipine 10 mg daily We will continue to monitor BP and titrate medications accordingly   UTI, UA positive, continue ceftriaxone Follow urine culture growing Klebsiella pneumonia, follow sensitivity report.   # Elevated troponin - etiology unclear at this time - Troponin is downtrending which is reassuring - Complete echo ordered due to patient's HR has been difficult to control ranging from  110-150 - Patient is sleeping and  difficult to arouse - BNP 1828 elevated, but clinically euvolemic, avoided Lasix due to AKI  TTE LVEF 60-65%, small circumferential pericardial effusion, moderate MS, severe mitral valve calcification.  Patient was recommended to follow with cardiology as an outpatient to repeat 2D echocardiogram after few months  # History of cva in December of 2021 per spouse - MRI of the brain No acute infarct, hemorrhage, or mass. Continue Lipitor and patient is on Eliquis  # AKI on CKD stage IV, baseline creatinine 2.69 in sep 2022 - etiology work up in process  # History of renal transplant-resumed home cyclosporine 75 mg twice daily, mycophenolate 180 mg p.o. twice daily Started IV fluid for gentle hydration as patient has decreased oral intake due to somnolence Nephrology consulted as per request by patient's wife 1/7 CO2 18, mild metabolic acidosis, sodium bicarbonate 50 M EQ x2 doses given, patient was somnolent and unable to take oral bicarbonate. 1/8 started oral bicarbonate supplement Monitor BMP daily   Anemia of chronic disease most likely due to CKD stage IV On 1/7 Hb 7.1, transfused 1 unit of PRBC, patient's wife consented for blood transfusion. Posttransfusion hemoglobin 9.2 FOBT negative, iron 142, saturation 16%, X09 604 WNL Folic acid deficiency, folate level 5.5, started folic acid 1 mg p.o. daily, repeat folic acid level after 3 months, follow with PCP     Depression/anxiety-resume duloxetine 60 mg daily Started Atarax 25 mg p.o. 3 times daily due to worsening of anxiety  Chronic persistent atrial fibrillation-resumed Coreg 50 mg twice daily, apixaban 5 mg p.o. twice daily starting on 01/11/2021 Patient had A. fib with RVR, resolved s/p Cardizem drip, which was discontinued  Urine retention, Foley catheter inserted on 01/15/21, 350 ml urine was collected after Foley catheter insertion BPH-Flomax resumed   Hypomagnesemia, mag repleted.   Body mass index is 24.81 kg/m.   Interventions:   Follow PT and OT eval for disposition plan    Diet: Renal diet DVT Prophylaxis: Therapeutic Anticoagulation with Eliquis    Advance goals of care discussion: Full code  Family Communication: family was NOT present at bedside, at the time of interview.  The pt provided permission to discuss medical plan with the family. Opportunity was given to ask question and all questions were answered satisfactorily.   Disposition:  Pt is from Home, admitted with seizure, gradually improving, still has high blood pressure, and on IV Vimpat which precludes a safe discharge. Discharge to SNF, pending PT and OT eval, when clinically stable, needs clearance by neurology.. 1/7 [facility] is not excepting patients, they are on diversion for the past 4 days so cannot transfer patient at this time, we will continue to try.   Subjective: No overnight significant events, as per RN patient was awake and alert in the morning time.  During my interview patient was able to tell me his name and then he was dozing off.  Patient seems to be resting comfortably, without any acute distress.  We will continue closely monitor.   Physical Exam: General: NAD, lying comfortably in the bed, AO x1, dozing off   Appear in no distress, affect flat, patient was sleepy Eyes: EOMI ENT: Oral Mucosa Clear, moist  Neck: no JVD,  Cardiovascular: S1 and S2 Present, no Murmur,  Respiratory: good respiratory effort, Bilateral Air entry equal and Decreased, no Crackles, no wheezes Abdomen: Bowel Sound present, Soft and no tenderness,  Skin: no rashes Extremities: no Pedal edema, no calf tenderness Neurologic:  Bilateral lower extremity weakness, left greater than right, power 4/5, no sensory deficit. Gait not checked due to patient safety concerns  Vitals:   01/15/21 1200 01/15/21 1201 01/15/21 1300 01/15/21 1400  BP: (!) 135/100 (!) 135/100 (!) 147/93 (!) 149/88  Pulse: 71 73 80 75  Resp: 13 12 (!) 21 19  Temp:   97.9 F (36.6 C)    TempSrc:  Oral    SpO2: 96% 96% 97% 97%  Weight:      Height:        Intake/Output Summary (Last 24 hours) at 01/15/2021 1452 Last data filed at 01/15/2021 1300 Gross per 24 hour  Intake 4633.34 ml  Output --  Net 4633.34 ml   Filed Weights   01/10/21 1926 01/14/21 1152  Weight: 76.2 kg 70.8 kg    Data Reviewed: I have personally reviewed and interpreted daily labs, tele strips, imagings as discussed above. I reviewed all nursing notes, pharmacy notes, vitals, pertinent old records I have discussed plan of care as described above with RN and patient/family.  CBC: Recent Labs  Lab 01/10/21 2020 01/12/21 0953 01/13/21 1105 01/14/21 0706 01/15/21 0303  WBC 5.5 6.9 7.7 3.5* 4.9  HGB 7.3* 8.1* 8.3* 7.1* 9.2*  HCT 24.1* 25.7* 25.9* 22.4* 27.7*  MCV 76.8* 76.3* 75.1* 75.7* 76.3*  PLT 156 179 207 224 712   Basic Metabolic Panel: Recent Labs  Lab 01/10/21 2020 01/11/21 0111 01/12/21 0953 01/13/21 1105 01/14/21 0706 01/15/21 0303  NA 139  --  141 137 135 139  K 4.4  --  3.6 3.6 3.9 3.6  CL 103  --  105 105 106 106  CO2 26  --  23 21* 18* 19*  GLUCOSE 169*  --  124* 134* 164* 237*  BUN 73*  --  77* 71* 81* 85*  CREATININE 3.17*  --  3.01* 2.82* 3.12* 3.23*  CALCIUM 8.7*  --  8.8* 8.6* 8.5* 8.3*  MG  --  1.5* 1.9 1.7 1.8 1.8  PHOS  --  4.4 4.5 4.1 6.1* 5.6*    Studies: MR BRAIN WO CONTRAST  Result Date: 01/15/2021 CLINICAL DATA:  66 year old male with altered mental status. Query posterior reversible encephalopathy syndrome (PRES). EXAM: MRI HEAD WITHOUT CONTRAST TECHNIQUE: Multiplanar, multiecho pulse sequences of the brain and surrounding structures were obtained without intravenous contrast. COMPARISON:  Brain MRI 01/11/2021. Head CT 01/13/2021. FINDINGS: The examination had to be discontinued prior to completion by patient request. Axial and coronal DWI and sagittal T1 weighted imaging only. No midline shift, mass effect, or evidence of  intracranial mass lesion. No ventriculomegaly. Stable diffusion. Chronic lacunar infarcts in the deep gray matter nuclei, brainstem. Chronic cerebellar infarcts and confluent facilitated diffusion in the bilateral cerebral white matter. No restricted diffusion or evidence of acute infarction. Normal basilar cisterns. Negative pituitary and cervicomedullary junction. Visualized bone marrow signal is within normal limits. Grossly negative visible cervical spine. IMPRESSION: 1. Discontinued exam by patient request. Only DWI and sagittal T1 weighted imaging obtained today. 2. No evidence of acute infarct.  Advanced chronic ischemia. Electronically Signed   By: Genevie Ann M.D.   On: 01/15/2021 08:08    Scheduled Meds:  amLODipine  10 mg Oral Daily   apixaban  5 mg Oral BID   atorvastatin  40 mg Oral QHS   [START ON 01/16/2021] azithromycin  500 mg Oral Daily   Chlorhexidine Gluconate Cloth  6 each Topical Q0600   cholecalciferol  2,000 Units Oral Daily   cycloSPORINE  75 mg Oral BID   DULoxetine  60 mg Oral Daily   finasteride  5 mg Oral Daily   fluticasone furoate-vilanterol  1 puff Inhalation Daily   folic acid  1 mg Oral Daily   hydrOXYzine  25 mg Oral TID   insulin aspart  0-5 Units Subcutaneous QHS   insulin aspart  0-9 Units Subcutaneous TID WC   labetalol  200 mg Oral TID   linagliptin  5 mg Oral Daily   mouth rinse  15 mL Mouth Rinse BID   mycophenolate  180 mg Oral BID   [START ON 01/16/2021] predniSONE  40 mg Oral Q breakfast   Followed by   Derrill Memo ON 01/17/2021] predniSONE  30 mg Oral Q breakfast   Followed by   Derrill Memo ON 01/18/2021] predniSONE  20 mg Oral Q breakfast   Followed by   Derrill Memo ON 01/19/2021] predniSONE  10 mg Oral Q breakfast   sodium bicarbonate  650 mg Oral TID   tamsulosin  0.8 mg Oral QPC supper   Continuous Infusions:  sodium chloride 75 mL/hr at 01/14/21 2200   cefTRIAXone (ROCEPHIN)  IV 1 g (01/15/21 1233)   lacosamide (VIMPAT) IV 150 mg (01/15/21 1346)    niCARDipine 3 mg/hr (01/15/21 0831)   PRN Meds: diltiazem, haloperidol lactate, hydrALAZINE, ipratropium-albuterol, LORazepam, metoprolol tartrate, ondansetron **OR** ondansetron (ZOFRAN) IV  Time spent: 35 minutes  Author: Val Riles. MD [facility] 01/15/2021 2:52 PM  To reach On-call, see care teams to locate the attending and reach out to them via [email or web address]. If 7PM-7AM, please contact night-coverage If you still have difficulty reaching the attending provider, please page the [facility] (Director on Call) for [facility] on amion for assistance.

## 2021-01-16 ENCOUNTER — Inpatient Hospital Stay: Payer: No Typology Code available for payment source

## 2021-01-16 DIAGNOSIS — N179 Acute kidney failure, unspecified: Secondary | ICD-10-CM

## 2021-01-16 DIAGNOSIS — G9340 Encephalopathy, unspecified: Secondary | ICD-10-CM

## 2021-01-16 DIAGNOSIS — G40909 Epilepsy, unspecified, not intractable, without status epilepticus: Secondary | ICD-10-CM

## 2021-01-16 DIAGNOSIS — Z940 Kidney transplant status: Secondary | ICD-10-CM

## 2021-01-16 LAB — URINE CULTURE: Culture: >=100000 — AB

## 2021-01-16 LAB — BASIC METABOLIC PANEL
Anion gap: 9 (ref 5–15)
BUN: 86 mg/dL — ABNORMAL HIGH (ref 8–23)
CO2: 22 mmol/L (ref 22–32)
Calcium: 8.4 mg/dL — ABNORMAL LOW (ref 8.9–10.3)
Chloride: 110 mmol/L (ref 98–111)
Creatinine, Ser: 3.25 mg/dL — ABNORMAL HIGH (ref 0.61–1.24)
GFR, Estimated: 20 mL/min — ABNORMAL LOW (ref >60)
Glucose, Bld: 189 mg/dL — ABNORMAL HIGH (ref 70–99)
Potassium: 3.6 mmol/L (ref 3.5–5.1)
Sodium: 141 mmol/L (ref 135–145)

## 2021-01-16 LAB — CBC
HCT: 28.1 % — ABNORMAL LOW (ref 39.0–52.0)
Hemoglobin: 9 g/dL — ABNORMAL LOW (ref 13.0–17.0)
MCH: 24.9 pg — ABNORMAL LOW (ref 26.0–34.0)
MCHC: 32 g/dL (ref 30.0–36.0)
MCV: 77.8 fL — ABNORMAL LOW (ref 80.0–100.0)
Platelets: 201 10*3/uL (ref 150–400)
RBC: 3.61 MIL/uL — ABNORMAL LOW (ref 4.22–5.81)
RDW: 23.9 % — ABNORMAL HIGH (ref 11.5–15.5)
WBC: 7 10*3/uL (ref 4.0–10.5)
nRBC: 0 % (ref 0.0–0.2)

## 2021-01-16 LAB — MAGNESIUM: Magnesium: 1.8 mg/dL (ref 1.7–2.4)

## 2021-01-16 LAB — GLUCOSE, CAPILLARY
Glucose-Capillary: 213 mg/dL — ABNORMAL HIGH (ref 70–99)
Glucose-Capillary: 251 mg/dL — ABNORMAL HIGH (ref 70–99)
Glucose-Capillary: 194 mg/dL — ABNORMAL HIGH (ref 70–99)
Glucose-Capillary: 239 mg/dL — ABNORMAL HIGH (ref 70–99)

## 2021-01-16 LAB — PATHOLOGIST SMEAR REVIEW

## 2021-01-16 LAB — PHOSPHORUS: Phosphorus: 5 mg/dL — ABNORMAL HIGH (ref 2.5–4.6)

## 2021-01-16 MED ORDER — SODIUM CHLORIDE 0.9 % IV SOLN
2.0000 g | INTRAVENOUS | Status: AC
  Administered 2021-01-17 – 2021-01-19 (×3): 2 g via INTRAVENOUS
  Filled 2021-01-16 (×2): qty 2
  Filled 2021-01-16: qty 20

## 2021-01-16 MED ORDER — HYDROXYZINE HCL 25 MG PO TABS
25.0000 mg | ORAL_TABLET | Freq: Three times a day (TID) | ORAL | Status: DC | PRN
  Administered 2021-01-16 – 2021-01-18 (×3): 25 mg via ORAL
  Filled 2021-01-16 (×4): qty 1

## 2021-01-16 MED ORDER — BISACODYL 10 MG RE SUPP: 10.0000 mg | Freq: Every evening | RECTAL | Status: DC | PRN

## 2021-01-16 MED ORDER — BISACODYL 5 MG PO TBEC: 10.0000 mg | DELAYED_RELEASE_TABLET | Freq: Every day | ORAL | Status: DC | PRN

## 2021-01-16 MED ORDER — POLYETHYLENE GLYCOL 3350 17 G PO PACK
17.0000 g | PACK | Freq: Every day | ORAL | Status: DC
  Administered 2021-01-16 – 2021-01-19 (×4): 17 g via ORAL
  Filled 2021-01-16 (×4): qty 1

## 2021-01-16 NOTE — Progress Notes (Signed)
Physical Therapy Treatment Patient Details Name: Jared Gregory MRN: [id number] DOB: [birth year] Today's Date: 01/16/2021   History of Present Illness Lundy Cozart is a 66 y.o. male with medical history significant for anxiety, depression, hyperlipidemia, atrial fibrillation on Coreg and Eliquis, cva in January 03, 2020, history of seizures on lacosamide, ESRD status post kidney transplant about 5 years ago in [place], who presents emergency department for chief concerns of seizure activity from facility [facility]).    PT Comments    Pt ready and motivated for session.  Awake and self feeding pudding.  He is motivated to participate and is able to get to EOB with min/mod assist with good effort but needs most of help to get hips to edge and some trunk support.  He remains sitting for about 15 minutes but asks to return to supine due to back pain.  Overall fair sitting balance but does need some verbal and tactile cues to correct posture due to left lean.  Seated LE arom.  During discussion, it seems that he uses a sit to stand lift at facility and does stand some and maybe take a few steps in parallel bars.  He would be appropriate for sit to stand lift given his sitting balance for OOB transfers but would recommend and would likely need heavy +2 assist without lift. Overall improved mobility and tolerance this session.    Recommendations for follow up therapy are one component of a multi-disciplinary discharge planning process, led by the attending physician.  Recommendations may be updated based on patient status, additional functional criteria and insurance authorization.  Follow Up Recommendations  Skilled nursing-short term rehab (<3 hours/day)     Assistance Recommended at Discharge Intermittent Supervision/Assistance  Patient can return home with the following Other (comment)   Equipment Recommendations       Recommendations for Other Services       Precautions / Restrictions  Precautions Precautions: Fall Precaution Comments: seizure Restrictions Weight Bearing Restrictions: No     Mobility  Bed Mobility Overal bed mobility: Needs Assistance Bed Mobility: Sit to Supine;Supine to Sit;Rolling Rolling: Mod assist;Max assist   Supine to sit: Min assist;Mod assist Sit to supine: Mod assist   General bed mobility comments: overall does well with good effort for session.    Transfers                   General transfer comment: deferred due to baseline status - would need +2 to attempt standing.    Ambulation/Gait                   Stairs             Wheelchair Mobility    Modified Rankin (Stroke Patients Only)       Balance Overall balance assessment: Needs assistance Sitting-balance support: Feet unsupported;Bilateral upper extremity supported Sitting balance-Leahy Scale: Fair Sitting balance - Comments: needs occasional verbal and tactile cues to correct posture but overall does quite well. Postural control: Left lateral lean     Standing balance comment: did not attempt. seems to be lift at baseline but did state he takes a couple steps in therapy at facility in parallel bars - would need +2 to attempt on unit                            Cognition Arousal/Alertness: Awake/alert Behavior During Therapy: [facility] for tasks assessed/performed Overall Cognitive Status: No family/caregiver  present to determine baseline cognitive functioning                                 General Comments: alert and motivated for session.  seems to be sabina/sit to stand lift at facility.        Exercises Other Exercises Other Exercises: seated AROM    General Comments        Pertinent Vitals/Pain Pain Assessment: Faces Faces Pain Scale: Hurts little more Pain Location: back after some time sitting. Pain Descriptors / Indicators: Sore Pain Intervention(s): Limited activity within patient's  tolerance;Monitored during session;Repositioned    Home Living                          Prior Function            PT Goals (current goals can now be found in the care plan section) Progress towards PT goals: Progressing toward goals    Frequency    Min 2X/week      PT Plan Current plan remains appropriate    Co-evaluation              AM-PAC PT "6 Clicks" Mobility   Outcome Measure  Help needed turning from your back to your side while in a flat bed without using bedrails?: Total Help needed moving from lying on your back to sitting on the side of a flat bed without using bedrails?: A Lot Help needed moving to and from a bed to a chair (including a wheelchair)?: Total Help needed standing up from a chair using your arms (e.g., wheelchair or bedside chair)?: Total Help needed to walk in hospital room?: Total Help needed climbing 3-5 steps with a railing? : Total 6 Click Score: 7    End of Session   Activity Tolerance: Patient tolerated treatment well;Patient limited by lethargy Patient left: in bed;with call bell/phone within reach;with bed alarm set Nurse Communication: Mobility status PT Visit Diagnosis: Other abnormalities of gait and mobility (R26.89);Unsteadiness on feet (R26.81);Muscle weakness (generalized) (M62.81);Difficulty in walking, not elsewhere classified (R26.2);Other symptoms and signs involving the nervous system (R29.898)     Time: 1324-4010 PT Time Calculation (min) (ACUTE ONLY): 29 min  Charges:  $Therapeutic Exercise: 8-22 mins $Therapeutic Activity: 8-22 mins                    Chesley Noon, PTA 01/16/21, 4:35 PM

## 2021-01-16 NOTE — Progress Notes (Signed)
[facility] Progress Note  Patient: Jared Gregory    IDP:[id number]  DOA: 01/10/2021     Date of Service: the patient was seen and examined on 01/16/2021  Chief Complaint  Patient presents with   Seizures    Pt from [facility] had 3 witnessed seizures ranging from 30 seconds to 1 minute. Pt has hx of epilepsy. Given 2 of versed by EMS. Post ictal on arrival   Brief hospital course: Jared Gregory is a 66 y.o. male with medical history significant for anxiety, depression, hyperlipidemia, atrial fibrillation on Coreg and Eliquis, cva in January 03, 2020, history of seizures on lacosamide, ESRD status post kidney transplant about 5 years ago in [place], who presents emergency department for chief concerns of seizure activity from facility. ED Course: Discussed with emergency medicine provider, patient requiring hospitalization for chief concerns of seizure.  Vitals in the emergency department showed temperature of 97.4, respiration rate of 20, heart rate of 96, initial blood pressure 194/112, improved to 181/106, SPO2 100% on room air.  Serum sodium 139, potassium 4.4, chloride 103, bicarb 26, BUN is 18, serum creatinine 3.17, nonfasting blood glucose 169, WBC 5.5, hemoglobin 7.3, platelets of 156.  GFR of 21.  Troponin was 40.      Assessment and Plan:   Principal Problem:   Seizure ([place]) Active Problems:   Depression   Essential hypertension   On continuous oral anticoagulation   BPH (benign prostatic hyperplasia)    Metabolic encephalopathy multifactorial could be due to seizures, hypertensive urgency, pneumonia, COPD, UTI, Continue supportive care and treat underlying cause Continue aspiration and fall precautions MRI brain negative for any acute findings 1/9 significant improvement noticed today, awake and alert and patient is AO x3,    # Seizure activity # Increased somnolence, Post ictal and due to ativan  Patient is status post Keppra 1 g IV - Increased Lacosamide 150  mg IV twice daily as per neurology, will transition to oral after improvement. - Ativan injection 2 mg IV as needed for breakthrough seizures, 3 doses ordered with instructions for nursing staff to alert provider once IV Ativan has been used - continue aspiration, fall precautions, seizure precautions EEG negative for epilepsy Follow neurology for further recommendation and clearance for discharge   Acute respiratory failure secondary to community-acquired pneumonia Continue supplemental O2 inhalation and gradually wean off Started ceftriaxone and azithromycin CT chest Multiple patchy airspace opacities are noted in both upper lobes concerning for multifocal pneumonia. Small bilateral pleural effusions are noted with adjacent subsegmental atelectasis. Extensive coronary artery calcifications are noted suggesting coronary artery disease. respiratory viral panel negative, procalcitonin negative Follow urine antigen for strep negative and Legionella still in process blood cultures NGTD ID consult appreciated  COPD exacerbation secondary to underlying pneumonia S/p IV Solu-Medrol, on 1/8 started oral prednisone tapering dose Started DuoNeb every 6 hourly and Breo Ellipta inhaler daily Pulmonary critical care consulted for further management ABG reviewed Continue BiPAP as needed  Hypertensive urgency, blood pressure remained uncontrolled Home meds Coreg 50 twice daily, losartan 50 and amlodipine 10 mg  1/6 Cardine IV infusion was started, we will try to wean off today 1/8 discontinued Coreg and losartan Started labetalol 200 mg p.o. 3 times daily, resumed amlodipine 10 mg daily We will continue to monitor BP and titrate medications accordingly   UTI, UA positive, continue ceftriaxone Follow urine culture growing Klebsiella pneumonia, follow sensitivity report.   # Elevated troponin - etiology unclear at this time - Troponin is downtrending which  is reassuring - Complete echo ordered  due to patient's HR has been difficult to control ranging from 110-150 - Patient is sleeping and difficult to arouse - BNP 1828 elevated, but clinically euvolemic, avoided Lasix due to AKI  TTE LVEF 60-65%, small circumferential pericardial effusion, moderate MS, severe mitral valve calcification.  Patient was recommended to follow with cardiology as an outpatient to repeat 2D echocardiogram after few months  # History of cva in December of 2021 per spouse - MRI of the brain No acute infarct, hemorrhage, or mass. Continue Lipitor and patient is on Eliquis  # AKI on CKD stage IV, baseline creatinine 2.69 in sep 2022 - etiology work up in process  # History of renal transplant-resumed home cyclosporine 75 mg twice daily, mycophenolate 180 mg p.o. twice daily Started IV fluid for gentle hydration as patient has decreased oral intake due to somnolence Nephrology consulted as per request by patient's wife 1/7 CO2 18, mild metabolic acidosis, sodium bicarbonate 50 M EQ x2 doses given, patient was somnolent and unable to take oral bicarbonate. 1/8 started oral bicarbonate supplement Monitor BMP daily Creatinine 3.23 ---3.25 stable, continue to follow  Anemia of chronic disease most likely due to CKD stage IV On 1/7 Hb 7.1, transfused 1 unit of PRBC, patient's wife consented for blood transfusion. Posttransfusion hemoglobin 9.2--9.0 FOBT negative, iron 142, saturation 09%, X83 382 WNL Folic acid deficiency, folate level 5.5, started folic acid 1 mg p.o. daily, repeat folic acid level after 3 months, follow with PCP     Depression/anxiety-resume duloxetine 60 mg daily Started Atarax 25 mg p.o. 3 times daily due to worsening of anxiety  Chronic persistent atrial fibrillation-resumed Coreg 50 mg twice daily, apixaban 5 mg p.o. twice daily starting on 01/11/2021 Patient had A. fib with RVR, resolved s/p Cardizem drip, which was discontinued  Urine retention, Foley catheter inserted on 01/15/21, 350  ml urine was collected after Foley catheter insertion BPH-Flomax resumed   Hypomagnesemia, mag repleted.   Body mass index is 24.81 kg/m.  Interventions:   Follow PT and OT eval for disposition plan    Diet: Renal diet DVT Prophylaxis: Therapeutic Anticoagulation with Eliquis    Advance goals of care discussion: Full code  Family Communication: family was NOT present at bedside, at the time of interview.  The pt provided permission to discuss medical plan with the family. Opportunity was given to ask question and all questions were answered satisfactorily.   Disposition:  Pt is from Home, admitted with seizure, gradually improving, still has high blood pressure, and on IV Vimpat which precludes a safe discharge. Discharge to SNF, pending PT and OT eval, when clinically stable, needs clearance by neurology.. 1/7 [facility] is not excepting patients, they are on diversion for the past 4 days so cannot transfer patient at this time, we will continue to try.   Subjective: No overnight significant events, patient is much more awake and alert today, patient is feeling a lot better, denied any active issues, no chest pain or palpitation, no shortness of breath.   Physical Exam: General: NAD, lying comfortably in the bed, AO x3,    Appear in no distress, affect appropriate, looks happy, smiles on the face Eyes: EOMI ENT: Oral Mucosa Clear, moist  Neck: no JVD,  Cardiovascular: S1 and S2 Present, no Murmur,  Respiratory: good respiratory effort, Bilateral Air entry equal and Decreased, no Crackles, no wheezes Abdomen: Bowel Sound present, Soft and no tenderness,  Skin: no rashes Extremities: no Pedal  edema, no calf tenderness Neurologic: Bilateral lower extremity weakness, left greater than right, power 4/5, no sensory deficit. Gait not checked due to patient safety concerns  Vitals:   01/16/21 0600 01/16/21 0700 01/16/21 0808 01/16/21 0900  BP: (!) 177/99 (!) 177/99 (!) 177/94 (!)  154/99  Pulse: 78 79 73 78  Resp: 19 (!) 22 20 19   Temp:      TempSrc:      SpO2: 98% 99% 100% 98%  Weight:      Height:        Intake/Output Summary (Last 24 hours) at 01/16/2021 1356 Last data filed at 01/16/2021 1100 Gross per 24 hour  Intake 2021.98 ml  Output 995 ml  Net 1026.98 ml   Filed Weights   01/10/21 1926 01/14/21 1152  Weight: 76.2 kg 70.8 kg    Data Reviewed: I have personally reviewed and interpreted daily labs, tele strips, imagings as discussed above. I reviewed all nursing notes, pharmacy notes, vitals, pertinent old records I have discussed plan of care as described above with RN and patient/family.  CBC: Recent Labs  Lab 01/12/21 0953 01/13/21 1105 01/14/21 0706 01/15/21 0303 01/16/21 0442  WBC 6.9 7.7 3.5* 4.9 7.0  HGB 8.1* 8.3* 7.1* 9.2* 9.0*  HCT 25.7* 25.9* 22.4* 27.7* 28.1*  MCV 76.3* 75.1* 75.7* 76.3* 77.8*  PLT 179 207 224 214 323   Basic Metabolic Panel: Recent Labs  Lab 01/12/21 0953 01/13/21 1105 01/14/21 0706 01/15/21 0303 01/16/21 0442  NA 141 137 135 139 141  K 3.6 3.6 3.9 3.6 3.6  CL 105 105 106 106 110  CO2 23 21* 18* 19* 22  GLUCOSE 124* 134* 164* 237* 189*  BUN 77* 71* 81* 85* 86*  CREATININE 3.01* 2.82* 3.12* 3.23* 3.25*  CALCIUM 8.8* 8.6* 8.5* 8.3* 8.4*  MG 1.9 1.7 1.8 1.8 1.8  PHOS 4.5 4.1 6.1* 5.6* 5.0*    Studies: No results found.  Scheduled Meds:  amLODipine  10 mg Oral Daily   apixaban  5 mg Oral BID   atorvastatin  40 mg Oral QHS   azithromycin  500 mg Oral Daily   Chlorhexidine Gluconate Cloth  6 each Topical Q0600   cholecalciferol  2,000 Units Oral Daily   cycloSPORINE  75 mg Oral BID   DULoxetine  60 mg Oral Daily   finasteride  5 mg Oral Daily   fluticasone furoate-vilanterol  1 puff Inhalation Daily   folic acid  1 mg Oral Daily   insulin aspart  0-5 Units Subcutaneous QHS   insulin aspart  0-9 Units Subcutaneous TID WC   labetalol  200 mg Oral TID   linagliptin  5 mg Oral Daily   mouth  rinse  15 mL Mouth Rinse BID   mycophenolate  180 mg Oral BID   polyethylene glycol  17 g Oral Daily   [START ON 01/17/2021] predniSONE  30 mg Oral Q breakfast   Followed by   Derrill Memo ON 01/18/2021] predniSONE  20 mg Oral Q breakfast   Followed by   Derrill Memo ON 01/19/2021] predniSONE  10 mg Oral Q breakfast   sodium bicarbonate  650 mg Oral TID   tamsulosin  0.8 mg Oral QPC supper   Continuous Infusions:  sodium chloride 75 mL/hr at 01/14/21 2200   cefTRIAXone (ROCEPHIN)  IV 1 g (01/16/21 1229)   lacosamide (VIMPAT) IV 150 mg (01/16/21 1129)   niCARDipine Stopped (01/15/21 1229)   PRN Meds: bisacodyl, bisacodyl, diltiazem, haloperidol lactate, hydrALAZINE, hydrOXYzine, ipratropium-albuterol, LORazepam,  metoprolol tartrate, ondansetron **OR** ondansetron (ZOFRAN) IV  Time spent: 35 minutes  Author: Val Riles. MD [facility] 01/16/2021 1:56 PM  To reach On-call, see care teams to locate the attending and reach out to them via [email or web address]. If 7PM-7AM, please contact night-coverage If you still have difficulty reaching the attending provider, please page the [facility] (Director on Call) for [facility] on amion for assistance.

## 2021-01-16 NOTE — Progress Notes (Signed)
Date of Admission:  01/10/2021    ID: Jared Gregory is a 66 y.o. male  Principal Problem:   Seizure ([place]) Active Problems:   Depression   Essential hypertension   On continuous oral anticoagulation   BPH (benign prostatic hyperplasia)    Subjective: Awake, alert, working with PT Was made to sit on the side of the bed  Medications:   amLODipine  10 mg Oral Daily   apixaban  5 mg Oral BID   atorvastatin  40 mg Oral QHS   azithromycin  500 mg Oral Daily   Chlorhexidine Gluconate Cloth  6 each Topical Q0600   cholecalciferol  2,000 Units Oral Daily   cycloSPORINE  75 mg Oral BID   DULoxetine  60 mg Oral Daily   finasteride  5 mg Oral Daily   fluticasone furoate-vilanterol  1 puff Inhalation Daily   folic acid  1 mg Oral Daily   insulin aspart  0-5 Units Subcutaneous QHS   insulin aspart  0-9 Units Subcutaneous TID WC   labetalol  200 mg Oral TID   linagliptin  5 mg Oral Daily   mouth rinse  15 mL Mouth Rinse BID   mycophenolate  180 mg Oral BID   polyethylene glycol  17 g Oral Daily   [START ON 01/17/2021] predniSONE  30 mg Oral Q breakfast   Followed by   Derrill Memo ON 01/18/2021] predniSONE  20 mg Oral Q breakfast   Followed by   Derrill Memo ON 01/19/2021] predniSONE  10 mg Oral Q breakfast   sodium bicarbonate  650 mg Oral TID   tamsulosin  0.8 mg Oral QPC supper    Objective: Vital signs in last 24 hours: Temp:  [97.9 F (36.6 C)-98.9 F (37.2 C)] 98.9 F (37.2 C) (01/09 0400) Pulse Rate:  [71-80] 78 (01/09 0900) Resp:  [0-22] 19 (01/09 0900) BP: (135-177)/(77-135) 154/99 (01/09 0900) SpO2:  [93 %-100 %] 98 % (01/09 0900)  PHYSICAL EXAM:  General: Alert, cooperative, no distress, slow response Lungs: b/l air entry Heart: Regular rate and rhythm, no murmur, rub or gallop. Abdomen: Soft, non-tender,not distended. Bowel sounds normal. No masses Extremities: atraumatic, no cyanosis. No edema. No clubbing Skin: No rashes or lesions. Or bruising Lymph: Cervical,  supraclavicular normal. Neurologic: Grossly non-focal  Lab Results Recent Labs    01/15/21 0303 01/16/21 0442  WBC 4.9 7.0  HGB 9.2* 9.0*  HCT 27.7* 28.1*  NA 139 141  K 3.6 3.6  CL 106 110  CO2 19* 22  BUN 85* 86*  CREATININE 3.23* 3.25*   Liver Panel No results for input(s): PROT, ALBUMIN, AST, ALT, ALKPHOS, BILITOT, BILIDIR, IBILI in the last 72 hours. Sedimentation Rate No results for input(s): ESRSEDRATE in the last 72 hours. C-Reactive Protein No results for input(s): CRP in the last 72 hours.  Microbiology:  Studies/Results: MR BRAIN WO CONTRAST  Result Date: 01/15/2021 CLINICAL DATA:  66 year old male with altered mental status. Query posterior reversible encephalopathy syndrome (PRES). EXAM: MRI HEAD WITHOUT CONTRAST TECHNIQUE: Multiplanar, multiecho pulse sequences of the brain and surrounding structures were obtained without intravenous contrast. COMPARISON:  Brain MRI 01/11/2021. Head CT 01/13/2021. FINDINGS: The examination had to be discontinued prior to completion by patient request. Axial and coronal DWI and sagittal T1 weighted imaging only. No midline shift, mass effect, or evidence of intracranial mass lesion. No ventriculomegaly. Stable diffusion. Chronic lacunar infarcts in the deep gray matter nuclei, brainstem. Chronic cerebellar infarcts and confluent facilitated diffusion in the bilateral cerebral white  matter. No restricted diffusion or evidence of acute infarction. Normal basilar cisterns. Negative pituitary and cervicomedullary junction. Visualized bone marrow signal is within normal limits. Grossly negative visible cervical spine. IMPRESSION: 1. Discontinued exam by patient request. Only DWI and sagittal T1 weighted imaging obtained today. 2. No evidence of acute infarct.  Advanced chronic ischemia. Electronically Signed   By: Genevie Ann M.D.   On: 01/15/2021 08:08     Assessment/Plan: 66 year old male with history of renal transplant on cyclosporine and  mycophenolate, hypertension, diabetes mellitus, atrial fibrillation, CVA with vascular stroke, CAD and seizure history admitted with seizures      Seizures- improved  Meningitis was questioned but since yesterday  awake and more alert and follows commands appropriate  clinically no evidence of meningitis or encephalitis. Cyclosporine level sent , to make sure it is not high as it can lead to seizures Also patient was hypertensive and had to get nicardipine. PRES not evident in brain MRI   Bilateral upper lobe infiltrate- not hypoxic  Rule out aspiration   Hypertension  End-stage renal disease status post transplant in 2017 On cyclosporine and mycophenolate Currently creatinine is 3.12  UTI- pt has foley for acut  urinary retention  Kleb and citrobacter in urine- okay to treated with ceftriaxone Will get renal US to look for hydro and also to look at the transplant kidney   Afib on Eliquis, coreg  Anemia and mild leukopenia  The latter resolved For anemia he got a unit of PRBC   H/o CVA- [facility]    Discussed the management with his nurse

## 2021-01-16 NOTE — Progress Notes (Addendum)
Subjective: He is feeling much better today  Exam: Vitals:   01/16/21 0700 01/16/21 0808  BP: (!) 177/99 (!) 177/94  Pulse: 79 73  Resp: (!) 22 20  Temp:    SpO2: 99% 100%   Gen: In bed, NAD Resp: non-labored breathing, no acute distress Abd: soft, nt He resists passive movement in all extremities, including neck flexion, but when I ask him to he is able to pull his chin towards his chest.  Neuro: MS: He is awake, alert, answers questions, but still somewhat confused. CN: eyes conjugate, cross midline in both directions.  Motor: He has weakness of the right hand which is baseline, good strength proximally on the right, he has 4/5 weakness throughout the left side. Sensory: He endorses sensation bilaterally   Pertinent Labs:  Latest Reference Range 01/16/21 04:42  Glucose 70 - 99 mg/dL 189 (H)  BUN 8 - 23 mg/dL 86 (H)  Creatinine 0.61 - 1.24 mg/dL 3.25 (H)  Calcium 8.9 - 10.3 mg/dL 8.4 (L)  Anion gap 5 - 15  9  Phosphorus 2.5 - 4.6 mg/dL 5.0 (H)  Magnesium 1.7 - 2.4 mg/dL 1.8  GFR, Estimated >60 mL/min 20 (L)   Urinalysis Final Report 01/16/2021: [facility] [employer] [facility] PNEUMONIAE   EEG from yesterday with only slowing  Impression: 66 year old male with a history of seizures as well as vascular dementia with worsening mental status in the setting of pulmonary edema/pneumonia and urinary tract infection. Suspicion for CNS infection is relatively low, and favor his medical condition in the setting of vascular dementia is more likely causing a multifactorial delirium. He is very much improved.  Recommendations: 1) continue lacosamide 150 twice daily 2) continue treatment of pneumonia/UTI per internal medicine. 3) Neurology will remain available, please call for questions.   Electronically signed by:  Lynnae Sandhoff, MD Page: [phone] 01/16/2021, 8:15 AM   If 7pm- 7am, please page neurology on call as listed in [place].

## 2021-01-16 NOTE — Plan of Care (Signed)

## 2021-01-16 NOTE — Progress Notes (Signed)
Report given to Regino Schultze, RN for transfer to 105 on tele. Patient belongings returned. VSS. Patient transferred via bed by NT. CCMD notified. Patient wife notified. Patient care transferred at this time.

## 2021-01-16 NOTE — Progress Notes (Addendum)
[facility]  ROUNDING NOTE   Subjective:   Patient resting quietly Alert and oriented Tolerating meals Denies shortness of breath No edema  Creatinine 3.2 Urine output 825m recorded   Objective:  Vital signs in last 24 hours:  Temp:  [98 F (36.7 C)-98.9 F (37.2 C)] 98.9 F (37.2 C) (01/09 0400) Pulse Rate:  [71-80] 78 (01/09 0900) Resp:  [0-22] 19 (01/09 0900) BP: (144-177)/(79-135) 154/99 (01/09 0900) SpO2:  [93 %-100 %] 98 % (01/09 0900)  Weight change:  Filed Weights   01/10/21 1926 01/14/21 1152  Weight: 76.2 kg 70.8 kg    Intake/Output: I/O last 3 completed shifts: In: 3187.7 [P.O.:240; I.V.:2687.7; IV Piggyback:260] Out: 845 [Urine:845]   Intake/Output this shift:  Total I/O In: 240 [P.O.:240] Out: 150 [Urine:150]  Physical Exam: General: NAD,   Head: Normocephalic, atraumatic. Moist oral mucosal membranes  Eyes: Jaundiced sclera  Lungs:  Mild left sided wheeze, normal effort  Heart: Regular rate and rhythm  Abdomen:  Soft, nontender  Extremities:  no peripheral edema.  Neurologic: Nonfocal, moving all four extremities  Skin: No lesions  GU Foley    Basic Metabolic Panel: Recent Labs  Lab 01/12/21 0953 01/13/21 1105 01/14/21 0706 01/15/21 0303 01/16/21 0442  NA 141 137 135 139 141  K 3.6 3.6 3.9 3.6 3.6  CL 105 105 106 106 110  CO2 23 21* 18* 19* 22  GLUCOSE 124* 134* 164* 237* 189*  BUN 77* 71* 81* 85* 86*  CREATININE 3.01* 2.82* 3.12* 3.23* 3.25*  CALCIUM 8.8* 8.6* 8.5* 8.3* 8.4*  MG 1.9 1.7 1.8 1.8 1.8  PHOS 4.5 4.1 6.1* 5.6* 5.0*    Liver Function Tests: Recent Labs  Lab 01/10/21 2020  AST 16  ALT 7  ALKPHOS 51  BILITOT 1.0  PROT 7.7  ALBUMIN 2.8*   No results for input(s): LIPASE, AMYLASE in the last 168 hours. Recent Labs  Lab 01/11/21 0111  AMMONIA 13    CBC: Recent Labs  Lab 01/12/21 0953 01/13/21 1105 01/14/21 0706 01/15/21 0303 01/16/21 0442  WBC 6.9 7.7 3.5* 4.9 7.0  HGB 8.1* 8.3*  7.1* 9.2* 9.0*  HCT 25.7* 25.9* 22.4* 27.7* 28.1*  MCV 76.3* 75.1* 75.7* 76.3* 77.8*  PLT 179 207 224 214 201    Cardiac Enzymes: No results for input(s): CKTOTAL, CKMB, CKMBINDEX, TROPONINI in the last 168 hours.  BNP: Invalid input(s): POCBNP  CBG: Recent Labs  Lab 01/15/21 1114 01/15/21 1550 01/15/21 2103 01/16/21 0752 01/16/21 1144  GLUCAP 210* 166* 157* 213* 251*    Microbiology: Results for orders placed or performed during the hospital encounter of 01/10/21  Resp Panel by RT-PCR (Flu A&B, Covid) Nasopharyngeal Swab     Status: None   Collection Time: 01/11/21  1:11 AM   Specimen: Nasopharyngeal Swab; Nasopharyngeal(NP) swabs in vial transport medium  Result Value Ref Range Status   SARS Coronavirus 2 by RT PCR NEGATIVE NEGATIVE Final    Comment: (NOTE) SARS-CoV-2 target nucleic acids are NOT DETECTED.  The SARS-CoV-2 RNA is generally detectable in upper respiratory specimens during the acute phase of infection. The lowest concentration of SARS-CoV-2 viral copies this assay can detect is 138 copies/mL. A negative result does not preclude SARS-Cov-2 infection and should not be used as the sole basis for treatment or other patient management decisions. A negative result may occur with  improper specimen collection/handling, submission of specimen other than nasopharyngeal swab, presence of viral mutation(s) within the areas targeted by this assay, and inadequate  number of viral copies(<138 copies/mL). A negative result must be combined with clinical observations, patient history, and epidemiological information. The expected result is Negative.  Fact Sheet for Patients:  EntrepreneurPulse.com.au  Fact Sheet for Healthcare Providers:  IncredibleEmployment.be  This test is no t yet approved or cleared by the [place] FDA and  has been authorized for detection and/or diagnosis of SARS-CoV-2 by FDA under an Emergency Use  Authorization (EUA). This EUA will remain  in effect (meaning this test can be used) for the duration of the COVID-19 declaration under Section 564(b)(1) of the Act, 21 U.S.C.section 360bbb-3(b)(1), unless the authorization is terminated  or revoked sooner.       Influenza A by PCR NEGATIVE NEGATIVE Final   Influenza B by PCR NEGATIVE NEGATIVE Final    Comment: (NOTE) The Xpert Xpress SARS-CoV-2/FLU/RSV plus assay is intended as an aid in the diagnosis of influenza from Nasopharyngeal swab specimens and should not be used as a sole basis for treatment. Nasal washings and aspirates are unacceptable for Xpert Xpress SARS-CoV-2/FLU/RSV testing.  Fact Sheet for Patients: EntrepreneurPulse.com.au  Fact Sheet for Healthcare Providers: IncredibleEmployment.be  This test is not yet approved or cleared by the [place] FDA and has been authorized for detection and/or diagnosis of SARS-CoV-2 by FDA under an Emergency Use Authorization (EUA). This EUA will remain in effect (meaning this test can be used) for the duration of the COVID-19 declaration under Section 564(b)(1) of the Act, 21 U.S.C. section 360bbb-3(b)(1), unless the authorization is terminated or revoked.  Performed at [facility], [address] [place], [place] [address]   Urine Culture     Status: Abnormal   Collection Time: 01/13/21  9:48 AM   Specimen: In/Out Cath Urine  Result Value Ref Range Status   Specimen Description   Final    IN/OUT CATH URINE Performed at [facility], [address] [place], [place] [address]    Special Requests   Final    NONE Performed at [facility], [address] [place], [place] [address]    Culture (A)  Final    >=100,000 COLONIES/mL CITROBACTER SPECIES >=100,000 COLONIES/mL KLEBSIELLA PNEUMONIAE    Report Status 01/16/2021 FINAL  Final   Organism ID, Bacteria CITROBACTER SPECIES (A)  Final   Organism  ID, Bacteria KLEBSIELLA PNEUMONIAE (A)  Final      Susceptibility   Citrobacter species - MIC*    CEFAZOLIN >=64 RESISTANT Resistant     CEFEPIME <=0.12 SENSITIVE Sensitive     CEFTRIAXONE <=0.25 SENSITIVE Sensitive     CIPROFLOXACIN <=0.25 SENSITIVE Sensitive     GENTAMICIN <=1 SENSITIVE Sensitive     IMIPENEM 0.5 SENSITIVE Sensitive     NITROFURANTOIN <=16 SENSITIVE Sensitive     TRIMETH/SULFA <=20 SENSITIVE Sensitive     PIP/TAZO <=4 SENSITIVE Sensitive     * >=100,000 COLONIES/mL CITROBACTER SPECIES   Klebsiella pneumoniae - MIC*    AMPICILLIN >=32 RESISTANT Resistant     CEFAZOLIN <=4 SENSITIVE Sensitive     CEFEPIME <=0.12 SENSITIVE Sensitive     CEFTRIAXONE <=0.25 SENSITIVE Sensitive     CIPROFLOXACIN <=0.25 SENSITIVE Sensitive     GENTAMICIN <=1 SENSITIVE Sensitive     IMIPENEM 0.5 SENSITIVE Sensitive     NITROFURANTOIN 128 RESISTANT Resistant     TRIMETH/SULFA <=20 SENSITIVE Sensitive     AMPICILLIN/SULBACTAM 8 SENSITIVE Sensitive     PIP/TAZO <=4 SENSITIVE Sensitive     * >=100,000 COLONIES/mL KLEBSIELLA PNEUMONIAE  Respiratory (~20 pathogens)  panel by PCR     Status: None   Collection Time: 01/13/21  1:59 PM   Specimen: Nasopharyngeal Swab; Respiratory  Result Value Ref Range Status   Adenovirus NOT DETECTED NOT DETECTED Final   Coronavirus 229E NOT DETECTED NOT DETECTED Final    Comment: (NOTE) The Coronavirus on the Respiratory Panel, DOES NOT test for the novel  Coronavirus (2019 nCoV)    Coronavirus HKU1 NOT DETECTED NOT DETECTED Final   Coronavirus NL63 NOT DETECTED NOT DETECTED Final   Coronavirus OC43 NOT DETECTED NOT DETECTED Final   Metapneumovirus NOT DETECTED NOT DETECTED Final   Rhinovirus / Enterovirus NOT DETECTED NOT DETECTED Final   Influenza A NOT DETECTED NOT DETECTED Final   Influenza B NOT DETECTED NOT DETECTED Final   Parainfluenza Virus 1 NOT DETECTED NOT DETECTED Final   Parainfluenza Virus 2 NOT DETECTED NOT DETECTED Final    Parainfluenza Virus 3 NOT DETECTED NOT DETECTED Final   Parainfluenza Virus 4 NOT DETECTED NOT DETECTED Final   Respiratory Syncytial Virus NOT DETECTED NOT DETECTED Final   Bordetella pertussis NOT DETECTED NOT DETECTED Final   Bordetella Parapertussis NOT DETECTED NOT DETECTED Final   Chlamydophila pneumoniae NOT DETECTED NOT DETECTED Final   Mycoplasma pneumoniae NOT DETECTED NOT DETECTED Final    Comment: Performed at [facility], [address] [place], [place] [address]  Culture, blood (Routine X 2) w Reflex to ID Panel     Status: None (Preliminary result)   Collection Time: 01/14/21  7:05 AM   Specimen: BLOOD RIGHT HAND  Result Value Ref Range Status   Specimen Description BLOOD RIGHT HAND  Final   Special Requests   Final    BOTTLES DRAWN AEROBIC AND ANAEROBIC Blood Culture adequate volume   Culture   Final    NO GROWTH 2 DAYS Performed at [facility], [address], [place], [place] [address]    Report Status PENDING  Incomplete  Culture, blood (Routine X 2) w Reflex to ID Panel     Status: None (Preliminary result)   Collection Time: 01/14/21  7:11 AM   Specimen: BLOOD RIGHT HAND  Result Value Ref Range Status   Specimen Description BLOOD RIGHT HAND  Final   Special Requests   Final    BOTTLES DRAWN AEROBIC ONLY Blood Culture adequate volume   Culture   Final    NO GROWTH 2 DAYS Performed at [facility], [address], [place], [place] [address]    Report Status PENDING  Incomplete  MRSA Next Gen by PCR, Nasal     Status: None   Collection Time: 01/14/21 11:53 AM   Specimen: Nasal Mucosa; Nasal Swab  Result Value Ref Range Status   MRSA by PCR Next Gen NOT DETECTED NOT DETECTED Final    Comment: (NOTE) The GeneXpert MRSA Assay (FDA approved for NASAL specimens only), is one component of a comprehensive MRSA colonization surveillance program. It is not intended to diagnose MRSA infection nor to guide or monitor treatment for MRSA  infections. Test performance is not FDA approved in patients less than 63 years old. Performed at [facility], [address] [place], [place] [address]     Coagulation Studies: No results for input(s): LABPROT, INR in the last 72 hours.  Urinalysis: No results for input(s): COLORURINE, LABSPEC, PHURINE, GLUCOSEU, HGBUR, BILIRUBINUR, KETONESUR, PROTEINUR, UROBILINOGEN, NITRITE, LEUKOCYTESUR in the last 72 hours.  Invalid input(s): APPERANCEUR    Imaging: MR BRAIN WO CONTRAST  Result  Date: 01/15/2021 CLINICAL DATA:  66 year old male with altered mental status. Query posterior reversible encephalopathy syndrome (PRES). EXAM: MRI HEAD WITHOUT CONTRAST TECHNIQUE: Multiplanar, multiecho pulse sequences of the brain and surrounding structures were obtained without intravenous contrast. COMPARISON:  Brain MRI 01/11/2021. Head CT 01/13/2021. FINDINGS: The examination had to be discontinued prior to completion by patient request. Axial and coronal DWI and sagittal T1 weighted imaging only. No midline shift, mass effect, or evidence of intracranial mass lesion. No ventriculomegaly. Stable diffusion. Chronic lacunar infarcts in the deep gray matter nuclei, brainstem. Chronic cerebellar infarcts and confluent facilitated diffusion in the bilateral cerebral white matter. No restricted diffusion or evidence of acute infarction. Normal basilar cisterns. Negative pituitary and cervicomedullary junction. Visualized bone marrow signal is within normal limits. Grossly negative visible cervical spine. IMPRESSION: 1. Discontinued exam by patient request. Only DWI and sagittal T1 weighted imaging obtained today. 2. No evidence of acute infarct.  Advanced chronic ischemia. Electronically Signed   By: Genevie Ann M.D.   On: 01/15/2021 08:08     Medications:    sodium chloride 75 mL/hr at 01/14/21 2200   cefTRIAXone (ROCEPHIN)  IV Stopped (01/15/21 1304)   lacosamide (VIMPAT) IV 150 mg (01/16/21 1129)    niCARDipine Stopped (01/15/21 1229)    amLODipine  10 mg Oral Daily   apixaban  5 mg Oral BID   atorvastatin  40 mg Oral QHS   azithromycin  500 mg Oral Daily   Chlorhexidine Gluconate Cloth  6 each Topical Q0600   cholecalciferol  2,000 Units Oral Daily   cycloSPORINE  75 mg Oral BID   DULoxetine  60 mg Oral Daily   finasteride  5 mg Oral Daily   fluticasone furoate-vilanterol  1 puff Inhalation Daily   folic acid  1 mg Oral Daily   insulin aspart  0-5 Units Subcutaneous QHS   insulin aspart  0-9 Units Subcutaneous TID WC   labetalol  200 mg Oral TID   linagliptin  5 mg Oral Daily   mouth rinse  15 mL Mouth Rinse BID   mycophenolate  180 mg Oral BID   polyethylene glycol  17 g Oral Daily   [START ON 01/17/2021] predniSONE  30 mg Oral Q breakfast   Followed by   Derrill Memo ON 01/18/2021] predniSONE  20 mg Oral Q breakfast   Followed by   Derrill Memo ON 01/19/2021] predniSONE  10 mg Oral Q breakfast   sodium bicarbonate  650 mg Oral TID   tamsulosin  0.8 mg Oral QPC supper   bisacodyl, bisacodyl, diltiazem, haloperidol lactate, hydrALAZINE, hydrOXYzine, ipratropium-albuterol, LORazepam, metoprolol tartrate, ondansetron **OR** ondansetron (ZOFRAN) IV  Assessment/ Plan:  Mr. Francois Elk is a 66 y.o.  male with past medical history consisting of diabetes type 2, hypertension, hep C, hyperlipidemia, anemia, multiple myeloma, and renal transplant in 2017, who was admitted to [facility] on 01/10/2021 for Seizure [facility]) [R56.9] Seizure-like activity ([place]) [R56.9] Syncope, unspecified syncope type [R55]   Acute Kidney Injury on chronic kidney disease stage IV, status post renal  transplant in April 2017.Baseline creatinine 3.1  on 12/28/20. [facility] could be considered multifactorial from UTI, pneumonia, and suspected seizure activity Losartan and diuretic currently held.  No IV contrast exposure.  No indication for dialysis at this time.  -Remains at baseline renal function.  Adequate urine output. Continue cyclosporine and mycophenolate at home doses.  Lab Results  Component Value Date   CREATININE 3.25 (H) 01/16/2021   CREATININE 3.23 (H) 01/15/2021  CREATININE 3.12 (H) 01/14/2021    Intake/Output Summary (Last 24 hours) at 01/16/2021 1201 Last data filed at 01/16/2021 1100 Gross per 24 hour  Intake 2261.98 ml  Output 995 ml  Net 1266.98 ml   2. Anemia of chronic kidney disease     Component Value Date/Time   HGB 9.0 (L) 01/16/2021 0442   HCT 28.1 (L) 01/16/2021 0442    Hgb just within desired target. Monitoring   LOS: [place] 1/9/202312:01 PM

## 2021-01-17 DIAGNOSIS — N179 Acute kidney failure, unspecified: Secondary | ICD-10-CM

## 2021-01-17 DIAGNOSIS — R55 Syncope and collapse: Secondary | ICD-10-CM

## 2021-01-17 DIAGNOSIS — Z7901 Long term (current) use of anticoagulants: Secondary | ICD-10-CM

## 2021-01-17 DIAGNOSIS — N401 Enlarged prostate with lower urinary tract symptoms: Secondary | ICD-10-CM

## 2021-01-17 DIAGNOSIS — Z940 Kidney transplant status: Secondary | ICD-10-CM

## 2021-01-17 DIAGNOSIS — N138 Other obstructive and reflux uropathy: Secondary | ICD-10-CM

## 2021-01-17 DIAGNOSIS — N390 Urinary tract infection, site not specified: Secondary | ICD-10-CM

## 2021-01-17 LAB — CBC
HCT: 27.2 % — ABNORMAL LOW (ref 39.0–52.0)
Hemoglobin: 8.8 g/dL — ABNORMAL LOW (ref 13.0–17.0)
MCH: 24.9 pg — ABNORMAL LOW (ref 26.0–34.0)
MCHC: 32.4 g/dL (ref 30.0–36.0)
MCV: 76.8 fL — ABNORMAL LOW (ref 80.0–100.0)
Platelets: 180 10*3/uL (ref 150–400)
RBC: 3.54 MIL/uL — ABNORMAL LOW (ref 4.22–5.81)
RDW: 23.9 % — ABNORMAL HIGH (ref 11.5–15.5)
WBC: 7.5 10*3/uL (ref 4.0–10.5)
nRBC: 0 % (ref 0.0–0.2)

## 2021-01-17 LAB — BASIC METABOLIC PANEL
Anion gap: 10 (ref 5–15)
BUN: 81 mg/dL — ABNORMAL HIGH (ref 8–23)
CO2: 21 mmol/L — ABNORMAL LOW (ref 22–32)
Calcium: 8 mg/dL — ABNORMAL LOW (ref 8.9–10.3)
Chloride: 108 mmol/L (ref 98–111)
Creatinine, Ser: 3.08 mg/dL — ABNORMAL HIGH (ref 0.61–1.24)
GFR, Estimated: 22 mL/min — ABNORMAL LOW (ref >60)
Glucose, Bld: 163 mg/dL — ABNORMAL HIGH (ref 70–99)
Potassium: 3.8 mmol/L (ref 3.5–5.1)
Sodium: 139 mmol/L (ref 135–145)

## 2021-01-17 LAB — GLUCOSE, CAPILLARY
Glucose-Capillary: 144 mg/dL — ABNORMAL HIGH (ref 70–99)
Glucose-Capillary: 172 mg/dL — ABNORMAL HIGH (ref 70–99)
Glucose-Capillary: 251 mg/dL — ABNORMAL HIGH (ref 70–99)
Glucose-Capillary: 243 mg/dL — ABNORMAL HIGH (ref 70–99)

## 2021-01-17 LAB — LEGIONELLA PNEUMOPHILA SEROGP 1 UR AG: L. pneumophila Serogp 1 Ur Ag: POSITIVE — AB

## 2021-01-17 LAB — CYCLOSPORINE: Cyclosporine, LabCorp: 266 ng/mL (ref 100–400)

## 2021-01-17 LAB — FUNGITELL, SERUM: Fungitell Result: <31 pg/mL (ref <80)

## 2021-01-17 LAB — MAGNESIUM: Magnesium: 1.6 mg/dL — ABNORMAL LOW (ref 1.7–2.4)

## 2021-01-17 LAB — PHOSPHORUS: Phosphorus: 4.3 mg/dL (ref 2.5–4.6)

## 2021-01-17 MED ORDER — MAGNESIUM SULFATE 2 GM/50ML IV SOLN
2.0000 g | Freq: Once | INTRAVENOUS | Status: AC
  Administered 2021-01-17: 2 g via INTRAVENOUS
  Filled 2021-01-17: qty 50

## 2021-01-17 MED ORDER — LOSARTAN POTASSIUM 50 MG PO TABS
50.0000 mg | ORAL_TABLET | Freq: Every day | ORAL | Status: DC
  Administered 2021-01-17: 12:00:00 50 mg via ORAL
  Filled 2021-01-17: qty 1

## 2021-01-17 NOTE — Plan of Care (Signed)

## 2021-01-17 NOTE — Progress Notes (Signed)
PROGRESS NOTE  Jared Gregory    DOB: 08-05-1955, 66 y.o.  MWN:[id number]  PCP: [facility]   Code Status: Full Code   DOA: 01/10/2021   LOS: 6  Brief Narrative of Current Hospitalization  Jared Gregory is a 66 y.o. male with a PMH significant for anxiety/depression, HLD, A. fib on Eliquis, CVA, seizures on lacosamide, ESRD s/p kidney transplant about 5 years ago. They presented from SNF [facility]) to the ED on 01/10/2021 with seizure. In the ED, it was found that they were in a postictal state.  Neurology was consulted. They were treated with IV Keppra and home dose of lacosamide.  Patient was admitted to medicine service for further workup and management of seizures as outlined in detail below. Patient also developed acute respiratory failure secondary to CAP/COPD. Additionally, hypertensive urgency was managed medically. Patient was found to have UTI, nephrology and ID were consulted.  01/17/21 -stable  Assessment & Plan  Principal Problem:   Seizure ([place]) Active Problems:   Depression   Essential hypertension   On continuous oral anticoagulation   BPH (benign prostatic hyperplasia)  Seizure disorder- no repeat seizure activity since admission.  EEG was negative for epilepsy - neurology following, appreciate recommendations -Continue lacosamide 150 mg twice daily -Ativan as needed for breakthrough seizures -Continue seizure precautions   Acute hypoxic respiratory failure- resolved.  Stable ORA CAP vs aspiration pneumonitis- s/p treatment. Denies respiratory complaints.  blood cultures NGTD   COPD- S/p IV Solu-Medrol - can continue prednisone taper but also OK to stop at day 5.  - continue breathing treatments  Hypertensive urgency- resolved. Bps mildly elevated in 403K systolics without symptoms.  -Continue amlodipine, labetalol, nicardipine drip  -Attempt to wean off of nicardipine drip today -Losartan was held for kidney function-will restart today as  patient appears to be at his baseline and will follow creatinine as I do expect a mild and transient increase with restarting ARB medication   UTI-urinalysis positive, urinary culture showing Citrobacter and Klebsiella species which are sensitive to ceftriaxone.  Indwelling Foley in place due to acute urinary retention.  No evidence of hydronephrosis on renal ultrasound 1/9. -ID following, appreciate recs -Continue ceftriaxone -Urology follow-up outpatient for urinary retention -Strict I/O   Elevated troponin -suspecting due to demand ischemia in setting of hypervolemia and seizure.  Troponins down trended and patient had no chest pain once able to provide history in postictal period.  Etiology unclear at this time -Outpatient cardiology follow-up   History of cva in December of 2021 per spouse.  Residual deficiencies in upper extremities. - MRI of the brain No acute infarct, hemorrhage, or mass. Continue Lipitor and patient is on Eliquis   AKI on CKD stage IV, s/p renal transplant, baseline creatinine 2.7-2.9 -nephrology following, appreciate recommendations -Continue home cyclosporine 75 mg twice daily, mycophenolate 180 mg p.o. twice daily -Continue oral bicarbonate supplement Monitor BMP daily   Anemia of chronic disease most likely due to CKD stage IV. S/p 1 unit pRBCs on 1/7 and has had stable hemoglobin around 9 since then. -CBC a.m. Folic acid deficiency, folate level 5.5, started folic acid 1 mg p.o. daily, repeat folic acid level after 3 months, follow with PCP   Depression/anxiety- -Continue home duloxetine 60 mg daily -Continue Atarax 25 mg p.o. 3 times daily  Chronic persistent atrial fibrillation- - apixaban 5 mg p.o. twice daily starting on 01/11/2021 Patient had A. fib with RVR, resolved s/p Cardizem drip, which was discontinued   Urine retention,  Foley catheter inserted on 01/15/21, 350 ml urine was collected after Foley catheter insertion BPH-Flomax resumed -Urology  follow-up outpatient.  Would recommend Foley to remain until that follow-up.   Hypomagnesemia, mag repleted.  DVT prophylaxis: Place TED hose Start: 01/10/21 2135 apixaban (ELIQUIS) tablet 5 mg   Diet:  Diet Orders (From admission, onward)     Start     Ordered   01/10/21 2135  Diet Heart Room service appropriate? Yes; Fluid consistency: Thin  Diet effective now       Question Answer Comment  Room service appropriate? Yes   Fluid consistency: Thin      01/10/21 2136            Subjective 01/17/21    Pt reports feeling hungry and wants his breakfast.  No other complaints today.  Disposition Plan & Communication  Patient status: Inpatient  Admitted From: SNF Disposition: Skilled nursing facility Anticipated discharge date: 1/11  Family Communication: none at bedside  Consults, Procedures, Significant Events  Consultants:  Nephrology Neurology ID CCM  Procedures/significant events:  EEG  Antimicrobials:  Anti-infectives (From admission, onward)    Start     Dose/Rate Route Frequency Ordered Stop   01/17/21 1200  cefTRIAXone (ROCEPHIN) 2 g in sodium chloride 0.9 % 100 mL IVPB        2 g 200 mL/hr over 30 Minutes Intravenous Every 24 hours 01/16/21 1651     01/16/21 1000  azithromycin (ZITHROMAX) tablet 500 mg  Status:  Discontinued        500 mg Oral Daily 01/15/21 1050 01/16/21 1642   01/15/21 1200  azithromycin (ZITHROMAX) tablet 500 mg        500 mg Oral Daily 01/15/21 1051 01/15/21 1224   01/13/21 1200  cefTRIAXone (ROCEPHIN) 1 g in sodium chloride 0.9 % 100 mL IVPB  Status:  Discontinued        1 g 200 mL/hr over 30 Minutes Intravenous Every 24 hours 01/13/21 1053 01/16/21 1651   01/13/21 1200  azithromycin (ZITHROMAX) 500 mg in sodium chloride 0.9 % 250 mL IVPB  Status:  Discontinued        500 mg 250 mL/hr over 60 Minutes Intravenous Every 24 hours 01/13/21 1053 01/15/21 1050       Objective   Vitals:   01/16/21 2144 01/16/21 2200 01/16/21 2304  01/17/21 0525  BP: (!) 170/92  (!) 154/42 (!) 149/59  Pulse: 80 80 78 79  Resp:  17 20   Temp:   97.8 F (36.6 C)   TempSrc:   Oral   SpO2:  99% 100% 100%  Weight:      Height:        Intake/Output Summary (Last 24 hours) at 01/17/2021 0700 Last data filed at 01/17/2021 0636 Gross per 24 hour  Intake 1841.33 ml  Output 700 ml  Net 1141.33 ml   Filed Weights   01/10/21 1926 01/14/21 1152  Weight: 76.2 kg 70.8 kg    Patient BMI: Body mass index is 23.05 kg/m.   Physical Exam:  General: awake, alert, NAD HEENT: atraumatic, clear conjunctiva, anicteric sclera, MMM, hearing grossly normal Respiratory: normal respiratory effort. Cardiovascular: normal S1/S2, RRR, no JVD, murmurs, quick capillary refill  Gastrointestinal: soft, NT, ND Nervous: A&O x3. Dysarthria  Extremities: non-pitting edema generalized. Decreased Rom of hands Skin: dry, intact, normal temperature, normal color. No rashes, lesions or ulcers on exposed skin Psychiatry: normal mood, congruent affect  Labs   I have personally reviewed following labs and  imaging studies CBC    Component Value Date/Time   WBC 7.5 01/17/2021 0618   RBC 3.54 (L) 01/17/2021 0618   HGB 8.8 (L) 01/17/2021 0618   HCT 27.2 (L) 01/17/2021 0618   PLT 180 01/17/2021 0618   MCV 76.8 (L) 01/17/2021 0618   MCH 24.9 (L) 01/17/2021 0618   MCHC 32.4 01/17/2021 0618   RDW 23.9 (H) 01/17/2021 0618   LYMPHSABS 0.4 (L) 09/30/2020 1539   MONOABS 0.2 09/30/2020 1539   EOSABS 0.1 09/30/2020 1539   BASOSABS 0.0 09/30/2020 1539   BMP Latest Ref Rng & Units 01/17/2021 02-11-2021 01/15/2021  Glucose 70 - 99 mg/dL 163(H) 189(H) 237(H)  BUN 8 - 23 mg/dL 81(H) 86(H) 85(H)  Creatinine 0.61 - 1.24 mg/dL 3.08(H) 3.25(H) 3.23(H)  Sodium 135 - 145 mmol/L 139 141 139  Potassium 3.5 - 5.1 mmol/L 3.8 3.6 3.6  Chloride 98 - 111 mmol/L 108 110 106  CO2 22 - 32 mmol/L 21(L) 22 19(L)  Calcium 8.9 - 10.3 mg/dL 8.0(L) 8.4(L) 8.3(L)   Imaging Studies  US  RENAL  Result Date: 11-Feb-2021 CLINICAL DATA:  Acute renal insufficiency. EXAM: RENAL / URINARY TRACT ULTRASOUND COMPLETE COMPARISON:  None. FINDINGS: Right Kidney: Renal measurements: 7.4 x 3.1 x 3.8 cm = volume: 46 mL. Moderate parenchyma atrophy and increased echogenicity. No hydronephrosis or shadowing stone. Subcentimeter lower pole cyst. Left Kidney: The left kidney is poorly visualized. There is a right lower quadrant renal transplant measuring 10.9 x 6.7 x 5.5 cm. No hydronephrosis or peritransplant collection. The renal transplant demonstrates a normal echogenicity. Bladder: The urinary bladder is decompressed around a Foley catheter. Diffusely thickened bladder wall may be partly related to underdistention. Correlation with urinalysis recommended to exclude cystitis. Other: There is a small ascites and a small right pleural effusion. IMPRESSION: 1. Atrophic and echogenic right kidney in keeping with chronic kidney disease. 2. Nonvisualization of the left kidney. 3. Unremarkable right lower quadrant renal transplant. 4. Small right pleural effusion and small ascites. Electronically Signed   By: Anner Crete M.D.   On: 02/11/2021 20:00    Medications   Scheduled Meds:  amLODipine  10 mg Oral Daily   apixaban  5 mg Oral BID   atorvastatin  40 mg Oral QHS   Chlorhexidine Gluconate Cloth  6 each Topical Q0600   cholecalciferol  2,000 Units Oral Daily   cycloSPORINE  75 mg Oral BID   DULoxetine  60 mg Oral Daily   finasteride  5 mg Oral Daily   fluticasone furoate-vilanterol  1 puff Inhalation Daily   folic acid  1 mg Oral Daily   insulin aspart  0-5 Units Subcutaneous QHS   insulin aspart  0-9 Units Subcutaneous TID WC   labetalol  200 mg Oral TID   linagliptin  5 mg Oral Daily   mouth rinse  15 mL Mouth Rinse BID   mycophenolate  180 mg Oral BID   polyethylene glycol  17 g Oral Daily   predniSONE  30 mg Oral Q breakfast   Followed by   Derrill Memo ON 01/18/2021] predniSONE  20 mg Oral Q  breakfast   Followed by   Derrill Memo ON 01/19/2021] predniSONE  10 mg Oral Q breakfast   sodium bicarbonate  650 mg Oral TID   tamsulosin  0.8 mg Oral QPC supper   No recently discontinued medications to reconcile  LOS: 6 days   Richarda Osmond, DO [facility] 01/17/2021, 7:00 AM   Available by Epic secure chat  7AM-7PM. If 7PM-7AM, please contact night-coverage Refer to [email or web address] to contact the [facility] Attending or Consulting provider for this pt

## 2021-01-17 NOTE — Progress Notes (Signed)
Date of Admission:  01/10/2021    ID: Jared Gregory is a 66 y.o. male  Principal Problem:   Seizure-like activity ([place]) Active Problems:   Depression   Essential hypertension   On continuous oral anticoagulation   BPH (benign prostatic hyperplasia)   AKI (acute kidney injury) ([place])   Renal transplant recipient   Syncope    Subjective: Out of ICU Says he is feeling better   Medications:   amLODipine  10 mg Oral Daily   apixaban  5 mg Oral BID   atorvastatin  40 mg Oral QHS   Chlorhexidine Gluconate Cloth  6 each Topical Q0600   cholecalciferol  2,000 Units Oral Daily   cycloSPORINE  75 mg Oral BID   DULoxetine  60 mg Oral Daily   finasteride  5 mg Oral Daily   fluticasone furoate-vilanterol  1 puff Inhalation Daily   folic acid  1 mg Oral Daily   insulin aspart  0-5 Units Subcutaneous QHS   insulin aspart  0-9 Units Subcutaneous TID WC   labetalol  200 mg Oral TID   linagliptin  5 mg Oral Daily   losartan  50 mg Oral Daily   mouth rinse  15 mL Mouth Rinse BID   mycophenolate  180 mg Oral BID   polyethylene glycol  17 g Oral Daily   [START ON 01/18/2021] predniSONE  20 mg Oral Q breakfast   Followed by   Derrill Memo ON 01/19/2021] predniSONE  10 mg Oral Q breakfast   sodium bicarbonate  650 mg Oral TID   tamsulosin  0.8 mg Oral QPC supper    Objective: Vital signs in last 24 hours: Temp:  [97.3 F (36.3 C)-98 F (36.7 C)] 97.7 F (36.5 C) (01/10 2044) Pulse Rate:  [72-82] 72 (01/10 2044) Resp:  [16-19] 16 (01/10 2044) BP: (116-153)/(59-83) 116/73 (01/10 2044) SpO2:  [96 %-100 %] 100 % (01/10 2044)  PHYSICAL EXAM:  General: Alert, cooperative, no distress, slow response Lungs: b/l air entry Heart: Regular rate and rhythm, no murmur, rub or gallop. Abdomen: Soft, non-tender,not distended. Bowel sounds normal. No masses Extremities: atraumatic, no cyanosis. No edema. No clubbing Skin: No rashes or lesions. Or bruising Lymph: Cervical, supraclavicular  normal. Neurologic: Contractures of small muscles of the hand lab Results Recent Labs    01/16/21 0442 01/17/21 0618  WBC 7.0 7.5  HGB 9.0* 8.8*  HCT 28.1* 27.2*  NA 141 139  K 3.6 3.8  CL 110 108  CO2 22 21*  BUN 86* 81*  CREATININE 3.25* 3.08*   Liver Panel No results for input(s): PROT, ALBUMIN, AST, ALT, ALKPHOS, BILITOT, BILIDIR, IBILI in the last 72 hours.   Studies/Results: US RENAL  Result Date: 01/16/2021 CLINICAL DATA:  Acute renal insufficiency. EXAM: RENAL / URINARY TRACT ULTRASOUND COMPLETE COMPARISON:  None. FINDINGS: Right Kidney: Renal measurements: 7.4 x 3.1 x 3.8 cm = volume: 46 mL. Moderate parenchyma atrophy and increased echogenicity. No hydronephrosis or shadowing stone. Subcentimeter lower pole cyst. Left Kidney: The left kidney is poorly visualized. There is a right lower quadrant renal transplant measuring 10.9 x 6.7 x 5.5 cm. No hydronephrosis or peritransplant collection. The renal transplant demonstrates a normal echogenicity. Bladder: The urinary bladder is decompressed around a Foley catheter. Diffusely thickened bladder wall may be partly related to underdistention. Correlation with urinalysis recommended to exclude cystitis. Other: There is a small ascites and a small right pleural effusion. IMPRESSION: 1. Atrophic and echogenic right kidney in keeping with chronic kidney disease.  2. Nonvisualization of the left kidney. 3. Unremarkable right lower quadrant renal transplant. 4. Small right pleural effusion and small ascites. Electronically Signed   By: Anner Crete M.D.   On: 01/16/2021 20:00     Assessment/Plan: 66 year old male with history of renal transplant on cyclosporine and mycophenolate, hypertension, diabetes mellitus, atrial fibrillation, CVA with vascular stroke, CAD and seizure history admitted with seizures      Seizures- improved  Meningitis was questioned but since yesterday  awake and more alert and follows commands appropriate   clinically no evidence of meningitis or encephalitis. Cyclosporine level sent , to make sure it is not high as it can lead to seizures Also patient was hypertensive and had to get nicardipine. PRES not evident in brain MRI   Bilateral upper lobe infiltrate- not hypoxic  Rule out aspiration   Hypertension  End-stage renal disease status post transplant in 2017 On cyclosporine and mycophenolate Currently creatinine is 3.12  UTI-secondary to acute urinary retention due to BPH..  Patient has Foley catheter.  Citrobacter and Klebsiella in the urine culture.  Patient is currently on ceftriaxone. Ultrasound kidneys done yesterday with renal transplant kidney in the right lower quadrant measuring 10.9-6.70 5.5.  No hydronephrosis or peritransplant collection.  The renal transplant demonstrates a normal echogenicity. Will give 7 days of antibiotic.    Afib on Eliquis, coreg  Anemia and mild leukopenia  The latter resolved For anemia he got a unit of PRBC   H/o CVA- followed [facility]    Discussed the management with care team.  ID will sign off call if needed.

## 2021-01-17 NOTE — Progress Notes (Signed)
[facility]  ROUNDING NOTE   Subjective:   Patient resting quietly  Began to smile and receptive to our visit Awaiting breakfast Denies shortness of breath  Creatinine 3.1 Urine output 76m recorded   Objective:  Vital signs in last 24 hours:  Temp:  [97.3 F (36.3 C)-98.5 F (36.9 C)] 97.3 F (36.3 C) (01/10 0852) Pulse Rate:  [73-83] 82 (01/10 0852) Resp:  [0-27] 19 (01/10 0852) BP: (135-170)/(42-100) 144/83 (01/10 0852) SpO2:  [95 %-100 %] 98 % (01/10 0852)  Weight change:  Filed Weights   01/10/21 1926 01/14/21 1152  Weight: 76.2 kg 70.8 kg    Intake/Output: I/O last 3 completed shifts: In: 2706.3 [P.O.:960; I.V.:1541.3; IV Piggyback:205] Out: 1175 [Urine:1175]   Intake/Output this shift:  No intake/output data recorded.  Physical Exam: General: NAD  Head: Normocephalic, atraumatic. Moist oral mucosal membranes  Eyes: Anicteric  Lungs:  Clear to auscultation, normal effort  Heart: Regular rate and rhythm  Abdomen:  Soft, nontender  Extremities:  no peripheral edema.  Neurologic: Nonfocal, moving all four extremities  Skin: No lesions  GU Foley    Basic Metabolic Panel: Recent Labs  Lab 01/13/21 1105 01/14/21 0706 01/15/21 0303 01/16/21 0442 01/17/21 0618  NA 137 135 139 141 139  K 3.6 3.9 3.6 3.6 3.8  CL 105 106 106 110 108  CO2 21* 18* 19* 22 21*  GLUCOSE 134* 164* 237* 189* 163*  BUN 71* 81* 85* 86* 81*  CREATININE 2.82* 3.12* 3.23* 3.25* 3.08*  CALCIUM 8.6* 8.5* 8.3* 8.4* 8.0*  MG 1.7 1.8 1.8 1.8 1.6*  PHOS 4.1 6.1* 5.6* 5.0* 4.3     Liver Function Tests: Recent Labs  Lab 01/10/21 2020  AST 16  ALT 7  ALKPHOS 51  BILITOT 1.0  PROT 7.7  ALBUMIN 2.8*    No results for input(s): LIPASE, AMYLASE in the last 168 hours. Recent Labs  Lab 01/11/21 0111  AMMONIA 13     CBC: Recent Labs  Lab 01/13/21 1105 01/14/21 0706 01/15/21 0303 01/16/21 0442 01/17/21 0618  WBC 7.7 3.5* 4.9 7.0 7.5  HGB 8.3* 7.1*  9.2* 9.0* 8.8*  HCT 25.9* 22.4* 27.7* 28.1* 27.2*  MCV 75.1* 75.7* 76.3* 77.8* 76.8*  PLT 207 224 214 201 180     Cardiac Enzymes: No results for input(s): CKTOTAL, CKMB, CKMBINDEX, TROPONINI in the last 168 hours.  BNP: Invalid input(s): POCBNP  CBG: Recent Labs  Lab 01/16/21 0752 01/16/21 1144 01/16/21 1609 01/16/21 2108 01/17/21 0852  GLUCAP 213* 251* 194* 239* 144*     Microbiology: Results for orders placed or performed during the hospital encounter of 01/10/21  Resp Panel by RT-PCR (Flu A&B, Covid) Nasopharyngeal Swab     Status: None   Collection Time: 01/11/21  1:11 AM   Specimen: Nasopharyngeal Swab; Nasopharyngeal(NP) swabs in vial transport medium  Result Value Ref Range Status   SARS Coronavirus 2 by RT PCR NEGATIVE NEGATIVE Final    Comment: (NOTE) SARS-CoV-2 target nucleic acids are NOT DETECTED.  The SARS-CoV-2 RNA is generally detectable in upper respiratory specimens during the acute phase of infection. The lowest concentration of SARS-CoV-2 viral copies this assay can detect is 138 copies/mL. A negative result does not preclude SARS-Cov-2 infection and should not be used as the sole basis for treatment or other patient management decisions. A negative result may occur with  improper specimen collection/handling, submission of specimen other than nasopharyngeal swab, presence of viral mutation(s) within the areas targeted by this assay,  and inadequate number of viral copies(<138 copies/mL). A negative result must be combined with clinical observations, patient history, and epidemiological information. The expected result is Negative.  Fact Sheet for Patients:  EntrepreneurPulse.com.au  Fact Sheet for Healthcare Providers:  IncredibleEmployment.be  This test is no t yet approved or cleared by the [place] FDA and  has been authorized for detection and/or diagnosis of SARS-CoV-2 by FDA under an Emergency  Use Authorization (EUA). This EUA will remain  in effect (meaning this test can be used) for the duration of the COVID-19 declaration under Section 564(b)(1) of the Act, 21 U.S.C.section 360bbb-3(b)(1), unless the authorization is terminated  or revoked sooner.       Influenza A by PCR NEGATIVE NEGATIVE Final   Influenza B by PCR NEGATIVE NEGATIVE Final    Comment: (NOTE) The Xpert Xpress SARS-CoV-2/FLU/RSV plus assay is intended as an aid in the diagnosis of influenza from Nasopharyngeal swab specimens and should not be used as a sole basis for treatment. Nasal washings and aspirates are unacceptable for Xpert Xpress SARS-CoV-2/FLU/RSV testing.  Fact Sheet for Patients: EntrepreneurPulse.com.au  Fact Sheet for Healthcare Providers: IncredibleEmployment.be  This test is not yet approved or cleared by the [place] FDA and has been authorized for detection and/or diagnosis of SARS-CoV-2 by FDA under an Emergency Use Authorization (EUA). This EUA will remain in effect (meaning this test can be used) for the duration of the COVID-19 declaration under Section 564(b)(1) of the Act, 21 U.S.C. section 360bbb-3(b)(1), unless the authorization is terminated or revoked.  Performed at [facility], [address] [place], [place] [address]   Urine Culture     Status: Abnormal   Collection Time: 01/13/21  9:48 AM   Specimen: In/Out Cath Urine  Result Value Ref Range Status   Specimen Description   Final    IN/OUT CATH URINE Performed at [facility], [address] [place], [place] [address]    Special Requests   Final    NONE Performed at [facility], [address] [place], [place] [address]    Culture (A)  Final    >=100,000 COLONIES/mL CITROBACTER SPECIES >=100,000 COLONIES/mL KLEBSIELLA PNEUMONIAE    Report Status 01/16/2021 FINAL  Final   Organism ID, Bacteria CITROBACTER SPECIES (A)  Final    Organism ID, Bacteria KLEBSIELLA PNEUMONIAE (A)  Final      Susceptibility   Citrobacter species - MIC*    CEFAZOLIN >=64 RESISTANT Resistant     CEFEPIME <=0.12 SENSITIVE Sensitive     CEFTRIAXONE <=0.25 SENSITIVE Sensitive     CIPROFLOXACIN <=0.25 SENSITIVE Sensitive     GENTAMICIN <=1 SENSITIVE Sensitive     IMIPENEM 0.5 SENSITIVE Sensitive     NITROFURANTOIN <=16 SENSITIVE Sensitive     TRIMETH/SULFA <=20 SENSITIVE Sensitive     PIP/TAZO <=4 SENSITIVE Sensitive     * >=100,000 COLONIES/mL CITROBACTER SPECIES   Klebsiella pneumoniae - MIC*    AMPICILLIN >=32 RESISTANT Resistant     CEFAZOLIN <=4 SENSITIVE Sensitive     CEFEPIME <=0.12 SENSITIVE Sensitive     CEFTRIAXONE <=0.25 SENSITIVE Sensitive     CIPROFLOXACIN <=0.25 SENSITIVE Sensitive     GENTAMICIN <=1 SENSITIVE Sensitive     IMIPENEM 0.5 SENSITIVE Sensitive     NITROFURANTOIN 128 RESISTANT Resistant     TRIMETH/SULFA <=20 SENSITIVE Sensitive     AMPICILLIN/SULBACTAM 8 SENSITIVE Sensitive     PIP/TAZO <=4 SENSITIVE Sensitive     * >=100,000 COLONIES/mL KLEBSIELLA PNEUMONIAE  Respiratory (~  20 pathogens) panel by PCR     Status: None   Collection Time: 01/13/21  1:59 PM   Specimen: Nasopharyngeal Swab; Respiratory  Result Value Ref Range Status   Adenovirus NOT DETECTED NOT DETECTED Final   Coronavirus 229E NOT DETECTED NOT DETECTED Final    Comment: (NOTE) The Coronavirus on the Respiratory Panel, DOES NOT test for the novel  Coronavirus (2019 nCoV)    Coronavirus HKU1 NOT DETECTED NOT DETECTED Final   Coronavirus NL63 NOT DETECTED NOT DETECTED Final   Coronavirus OC43 NOT DETECTED NOT DETECTED Final   Metapneumovirus NOT DETECTED NOT DETECTED Final   Rhinovirus / Enterovirus NOT DETECTED NOT DETECTED Final   Influenza A NOT DETECTED NOT DETECTED Final   Influenza B NOT DETECTED NOT DETECTED Final   Parainfluenza Virus 1 NOT DETECTED NOT DETECTED Final   Parainfluenza Virus 2 NOT DETECTED NOT DETECTED Final    Parainfluenza Virus 3 NOT DETECTED NOT DETECTED Final   Parainfluenza Virus 4 NOT DETECTED NOT DETECTED Final   Respiratory Syncytial Virus NOT DETECTED NOT DETECTED Final   Bordetella pertussis NOT DETECTED NOT DETECTED Final   Bordetella Parapertussis NOT DETECTED NOT DETECTED Final   Chlamydophila pneumoniae NOT DETECTED NOT DETECTED Final   Mycoplasma pneumoniae NOT DETECTED NOT DETECTED Final    Comment: Performed at [facility], [place]. [address], [place], [place] [address]  Culture, blood (Routine X 2) w Reflex to ID Panel     Status: None (Preliminary result)   Collection Time: 01/14/21  7:05 AM   Specimen: BLOOD RIGHT HAND  Result Value Ref Range Status   Specimen Description BLOOD RIGHT HAND  Final   Special Requests   Final    BOTTLES DRAWN AEROBIC AND ANAEROBIC Blood Culture adequate volume   Culture   Final    NO GROWTH 3 DAYS Performed at [facility], [address] [place], [place] [address]    Report Status PENDING  Incomplete  Culture, blood (Routine X 2) w Reflex to ID Panel     Status: None (Preliminary result)   Collection Time: 01/14/21  7:11 AM   Specimen: BLOOD RIGHT HAND  Result Value Ref Range Status   Specimen Description BLOOD RIGHT HAND  Final   Special Requests   Final    BOTTLES DRAWN AEROBIC ONLY Blood Culture adequate volume   Culture   Final    NO GROWTH 3 DAYS Performed at [facility], [address] [place], [place] [address]    Report Status PENDING  Incomplete  MRSA Next Gen by PCR, Nasal     Status: None   Collection Time: 01/14/21 11:53 AM   Specimen: Nasal Mucosa; Nasal Swab  Result Value Ref Range Status   MRSA by PCR Next Gen NOT DETECTED NOT DETECTED Final    Comment: (NOTE) The GeneXpert MRSA Assay (FDA approved for NASAL specimens only), is one component of a comprehensive MRSA colonization surveillance program. It is not intended to diagnose MRSA infection nor to guide or monitor treatment for MRSA  infections. Test performance is not FDA approved in patients less than 46 years old. Performed at [facility], [address] [place], [place] [address]     Coagulation Studies: No results for input(s): LABPROT, INR in the last 72 hours.  Urinalysis: No results for input(s): COLORURINE, LABSPEC, PHURINE, GLUCOSEU, HGBUR, BILIRUBINUR, KETONESUR, PROTEINUR, UROBILINOGEN, NITRITE, LEUKOCYTESUR in the last 72 hours.  Invalid input(s): APPERANCEUR    Imaging: US RENAL  Result  Date: 01/16/2021 CLINICAL DATA:  Acute renal insufficiency. EXAM: RENAL / URINARY TRACT ULTRASOUND COMPLETE COMPARISON:  None. FINDINGS: Right Kidney: Renal measurements: 7.4 x 3.1 x 3.8 cm = volume: 46 mL. Moderate parenchyma atrophy and increased echogenicity. No hydronephrosis or shadowing stone. Subcentimeter lower pole cyst. Left Kidney: The left kidney is poorly visualized. There is a right lower quadrant renal transplant measuring 10.9 x 6.7 x 5.5 cm. No hydronephrosis or peritransplant collection. The renal transplant demonstrates a normal echogenicity. Bladder: The urinary bladder is decompressed around a Foley catheter. Diffusely thickened bladder wall may be partly related to underdistention. Correlation with urinalysis recommended to exclude cystitis. Other: There is a small ascites and a small right pleural effusion. IMPRESSION: 1. Atrophic and echogenic right kidney in keeping with chronic kidney disease. 2. Nonvisualization of the left kidney. 3. Unremarkable right lower quadrant renal transplant. 4. Small right pleural effusion and small ascites. Electronically Signed   By: Anner Crete M.D.   On: 01/16/2021 20:00     Medications:    sodium chloride 75 mL/hr at 01/17/21 0612   cefTRIAXone (ROCEPHIN)  IV     lacosamide (VIMPAT) IV Stopped (01/16/21 2231)   niCARDipine Stopped (01/15/21 1229)    amLODipine  10 mg Oral Daily   apixaban  5 mg Oral BID   atorvastatin  40 mg Oral QHS    Chlorhexidine Gluconate Cloth  6 each Topical Q0600   cholecalciferol  2,000 Units Oral Daily   cycloSPORINE  75 mg Oral BID   DULoxetine  60 mg Oral Daily   finasteride  5 mg Oral Daily   fluticasone furoate-vilanterol  1 puff Inhalation Daily   folic acid  1 mg Oral Daily   insulin aspart  0-5 Units Subcutaneous QHS   insulin aspart  0-9 Units Subcutaneous TID WC   labetalol  200 mg Oral TID   linagliptin  5 mg Oral Daily   mouth rinse  15 mL Mouth Rinse BID   mycophenolate  180 mg Oral BID   polyethylene glycol  17 g Oral Daily   predniSONE  30 mg Oral Q breakfast   Followed by   Derrill Memo ON 01/18/2021] predniSONE  20 mg Oral Q breakfast   Followed by   Derrill Memo ON 01/19/2021] predniSONE  10 mg Oral Q breakfast   sodium bicarbonate  650 mg Oral TID   tamsulosin  0.8 mg Oral QPC supper   bisacodyl, bisacodyl, diltiazem, haloperidol lactate, hydrALAZINE, hydrOXYzine, ipratropium-albuterol, LORazepam, metoprolol tartrate, ondansetron **OR** ondansetron (ZOFRAN) IV  Assessment/ Plan:  Mr. Master Touchet is a 66 y.o.  male with past medical history consisting of diabetes type 2, hypertension, hep C, hyperlipidemia, anemia, multiple myeloma, and renal transplant in 2017, who was admitted to [facility] on 01/10/2021 for Seizure [facility]) [R56.9] Seizure-like activity ([place]) [R56.9] Syncope, unspecified syncope type [R55]   Acute Kidney Injury on chronic kidney disease stage IV, status post renal  transplant in April 2017.Baseline creatinine 3.1  on 12/28/20. [facility] could be considered multifactorial from UTI, pneumonia, and suspected seizure activity Losartan and diuretic currently held.  No IV contrast exposure.  No indication for dialysis at this time.   - Creatinine remains stable at baseline - Urine output recorded at 763m   Lab Results  Component Value Date   CREATININE 3.08 (H) 01/17/2021   CREATININE 3.25 (H) 01/16/2021   CREATININE 3.23 (H) 01/15/2021     Intake/Output Summary (Last 24 hours) at 01/17/2021 0947 Last data filed  at 01/17/2021 0636 Gross per 24 hour  Intake 1841.33 ml  Output 610 ml  Net 1231.33 ml    2. Anemia of chronic kidney disease     Component Value Date/Time   HGB 8.8 (L) 01/17/2021 0618   HCT 27.2 (L) 01/17/2021 0618    Hgb remains decreased. Will hold EPO initiation due to cancer history   LOS: 6 [place] 1/10/20239:47 AM

## 2021-01-18 ENCOUNTER — Encounter: Payer: Self-pay | Admitting: Internal Medicine

## 2021-01-18 ENCOUNTER — Other Ambulatory Visit: Payer: Self-pay

## 2021-01-18 LAB — CBC
HCT: 28.8 % — ABNORMAL LOW (ref 39.0–52.0)
Hemoglobin: 9.1 g/dL — ABNORMAL LOW (ref 13.0–17.0)
MCH: 24.5 pg — ABNORMAL LOW (ref 26.0–34.0)
MCHC: 31.6 g/dL (ref 30.0–36.0)
MCV: 77.6 fL — ABNORMAL LOW (ref 80.0–100.0)
Platelets: 176 10*3/uL (ref 150–400)
RBC: 3.71 MIL/uL — ABNORMAL LOW (ref 4.22–5.81)
RDW: 23.9 % — ABNORMAL HIGH (ref 11.5–15.5)
WBC: 6.9 10*3/uL (ref 4.0–10.5)
nRBC: 0 % (ref 0.0–0.2)

## 2021-01-18 LAB — BASIC METABOLIC PANEL
Anion gap: 7 (ref 5–15)
BUN: 78 mg/dL — ABNORMAL HIGH (ref 8–23)
CO2: 20 mmol/L — ABNORMAL LOW (ref 22–32)
Calcium: 8.2 mg/dL — ABNORMAL LOW (ref 8.9–10.3)
Chloride: 114 mmol/L — ABNORMAL HIGH (ref 98–111)
Creatinine, Ser: 2.78 mg/dL — ABNORMAL HIGH (ref 0.61–1.24)
GFR, Estimated: 25 mL/min — ABNORMAL LOW (ref >60)
Glucose, Bld: 202 mg/dL — ABNORMAL HIGH (ref 70–99)
Potassium: 4 mmol/L (ref 3.5–5.1)
Sodium: 141 mmol/L (ref 135–145)

## 2021-01-18 LAB — GLUCOSE, CAPILLARY
Glucose-Capillary: 166 mg/dL — ABNORMAL HIGH (ref 70–99)
Glucose-Capillary: 169 mg/dL — ABNORMAL HIGH (ref 70–99)
Glucose-Capillary: 258 mg/dL — ABNORMAL HIGH (ref 70–99)
Glucose-Capillary: 246 mg/dL — ABNORMAL HIGH (ref 70–99)

## 2021-01-18 LAB — FUNGITELL, SERUM: Fungitell Result: <31 pg/mL (ref <80)

## 2021-01-18 LAB — MAGNESIUM: Magnesium: 2 mg/dL (ref 1.7–2.4)

## 2021-01-18 LAB — PHOSPHORUS: Phosphorus: 3.4 mg/dL (ref 2.5–4.6)

## 2021-01-18 MED ORDER — AZITHROMYCIN 500 MG PO TABS
500.0000 mg | ORAL_TABLET | Freq: Every day | ORAL | Status: DC
  Administered 2021-01-18 – 2021-01-19 (×2): 500 mg via ORAL
  Filled 2021-01-18 (×2): qty 1

## 2021-01-18 MED ORDER — LACOSAMIDE 50 MG PO TABS
150.0000 mg | ORAL_TABLET | Freq: Two times a day (BID) | ORAL | Status: DC
  Administered 2021-01-18 – 2021-01-19 (×2): 150 mg via ORAL
  Filled 2021-01-18 (×2): qty 3

## 2021-01-18 NOTE — Plan of Care (Signed)

## 2021-01-18 NOTE — Plan of Care (Signed)
°  Problem: Education: Goal: Knowledge of General Education information will improve Description: Including pain rating scale, medication(s)/side effects and non-pharmacologic comfort measures 01/18/2021 1128 by Jane Broughton, Camillo Flaming, RN Outcome: Progressing 01/18/2021 1128 by Kelce Bouton, Camillo Flaming, RN Outcome: Progressing   Problem: Health Behavior/Discharge Planning: Goal: Ability to manage health-related needs will improve 01/18/2021 1128 by Edwing Figley, Camillo Flaming, RN Outcome: Progressing 01/18/2021 1128 by Daisy Blossom, RN Outcome: Progressing   Problem: Clinical Measurements: Goal: Ability to maintain clinical measurements within normal limits will improve 01/18/2021 1128 by Merida Alcantar, Camillo Flaming, RN Outcome: Progressing 01/18/2021 1128 by Daisy Blossom, RN Outcome: Progressing Goal: Will remain free from infection 01/18/2021 1128 by Tehila Sokolow, Camillo Flaming, RN Outcome: Progressing 01/18/2021 1128 by Daisy Blossom, RN Outcome: Progressing Goal: Diagnostic test results will improve 01/18/2021 1128 by Jazzmin Newbold, Camillo Flaming, RN Outcome: Progressing 01/18/2021 1128 by Daisy Blossom, RN Outcome: Progressing Goal: Respiratory complications will improve 01/18/2021 1128 by Sada Mazzoni, Camillo Flaming, RN Outcome: Progressing 01/18/2021 1128 by Daisy Blossom, RN Outcome: Progressing Goal: Cardiovascular complication will be avoided 01/18/2021 1128 by Lillyan Hitson, Camillo Flaming, RN Outcome: Progressing 01/18/2021 1128 by Daisy Blossom, RN Outcome: Progressing   Problem: Activity: Goal: Risk for activity intolerance will decrease 01/18/2021 1128 by Harden Bramer, Camillo Flaming, RN Outcome: Progressing 01/18/2021 1128 by Daisy Blossom, RN Outcome: Progressing   Problem: Nutrition: Goal: Adequate nutrition will be maintained 01/18/2021 1128 by Sabel Hornbeck, Camillo Flaming, RN Outcome: Progressing 01/18/2021 1128 by Daisy Blossom, RN Outcome: Progressing   Problem: Coping: Goal: Level of anxiety will  decrease 01/18/2021 1128 by Huntley Demedeiros, Camillo Flaming, RN Outcome: Progressing 01/18/2021 1128 by Daisy Blossom, RN Outcome: Progressing   Problem: Elimination: Goal: Will not experience complications related to bowel motility 01/18/2021 1128 by Krrish Freund, Camillo Flaming, RN Outcome: Progressing 01/18/2021 1128 by Daisy Blossom, RN Outcome: Progressing Goal: Will not experience complications related to urinary retention 01/18/2021 1128 by Shayra Anton, Camillo Flaming, RN Outcome: Progressing 01/18/2021 1128 by Daisy Blossom, RN Outcome: Progressing   Problem: Pain Managment: Goal: General experience of comfort will improve 01/18/2021 1128 by Jamarion [place], Camillo Flaming, RN Outcome: Progressing 01/18/2021 1128 by Daisy Blossom, RN Outcome: Progressing   Problem: Safety: Goal: Ability to remain free from injury will improve 01/18/2021 1128 by Naythan Douthit, Camillo Flaming, RN Outcome: Progressing 01/18/2021 1128 by Daisy Blossom, RN Outcome: Progressing   Problem: Skin Integrity: Goal: Risk for impaired skin integrity will decrease 01/18/2021 1128 by Ladasia Sircy, Camillo Flaming, RN Outcome: Progressing 01/18/2021 1128 by Daisy Blossom, RN Outcome: Progressing

## 2021-01-18 NOTE — Progress Notes (Signed)
ID signed off yesterday   but the legionella antigen urine came back positive  Cyclosporine level normal Beta D glucan Normal  As on admission he had b/l infiltrates and encephalopathy will treat with azithromycin for a total of 10 days- He has received 4 days of azithromycin and it was stopped on 1/10. Will restart 1/11 and finish 1/16. ID will sign off- call if needed

## 2021-01-18 NOTE — Progress Notes (Signed)
Occupational Therapy Treatment Patient Details Name: Jared Gregory MRN: [id number] DOB: 18-Sep-1955 Today's Date: 01/18/2021   History of present illness [name] is a 66 y.o. male with medical history significant for anxiety, depression, hyperlipidemia, atrial fibrillation on Coreg and Eliquis, cva in January 03, 2020, history of seizures on lacosamide, ESRD status post kidney transplant about 5 years ago in [place], who presents emergency department for chief concerns of seizure activity from facility [facility]).   OT comments  Pt seen for OT treatment on this date. Upon arrival to room, pt awake and participating in grooming tasks with nurse tech. Pt agreeable to OT tx. Pt demonstrating improved cognition this date - A&Ox3 and able to provide PLOF information (see below). Pt currently requires MOD A for bed-level grooming tasks, MOD A for bed mobility, and MOD A for seated UB dressing d/t decreased UE and trunk strength. Pt was only able to maintain upright posture for ~15sec before exhibiting L lateral lean and requiring MOD A to return to midline. Pt is making good progress toward goals and continues to benefit from skilled OT services to maximize return to PLOF and minimize risk of future falls, injury, caregiver burden, and readmission. Will continue to follow POC. Discharge recommendation remains appropriate.     Recommendations for follow up therapy are one component of a multi-disciplinary discharge planning process, led by the attending physician.  Recommendations may be updated based on patient status, additional functional criteria and insurance authorization.    Follow Up Recommendations  Skilled nursing-short term rehab (<3 hours/day)    Assistance Recommended at Discharge Frequent or constant Supervision/Assistance  Patient can return home with the following  Two people to help with bathing/dressing/bathroom;Assistance with feeding;Two people to help with walking and/or  transfers   Equipment Recommendations  Other (comment) (defer to next venue of care)       Precautions / Restrictions Precautions Precautions: Fall Precaution Comments: seizure Restrictions Weight Bearing Restrictions: No       Mobility Bed Mobility Overal bed mobility: Needs Assistance Bed Mobility: Sit to Supine;Supine to Sit     Supine to sit: Mod assist;HOB elevated Sit to supine: Mod assist   General bed mobility comments: Pt put forth good effort throughout. Requires +2 assist to scoot toward HOB during sit>supine                       Balance Overall balance assessment: Needs assistance Sitting-balance support: Feet unsupported;Bilateral upper extremity supported Sitting balance-Leahy Scale: Poor Sitting balance - Comments: Able to maintain static sitting balance EOB briefly without support before leaning to L side and requiring MOD A to return to midline position Postural control: Left lateral lean   Standing balance-Leahy Scale: Zero Standing balance comment: did not attempt. seems to be lift at baseline but did state he takes a couple steps in therapy at facility in parallel bars - would need +2 to attempt on unit                           ADL either performed or assessed with clinical judgement   ADL Overall ADL's : Needs assistance/impaired Eating/Feeding: Set up;Bed level Eating/Feeding Details (indicate cue type and reason): With cup placed in midline, pt able to bring cup to mouth and drink from straw Grooming: Oral care;Wash/dry face;Moderate assistance;Bed level Grooming Details (indicate cue type and reason): Pt able to bring toothbrush to mouth and brush, but requires physical  assist to assist to apply toothpaste on toothbrush and to ensure thoroughness brushing.         Upper Body Dressing : Moderate assistance;Sitting Upper Body Dressing Details (indicate cue type and reason): to don hospital gown while seated EOB. Requires  physical assist to don over R shoulder                          Cognition Arousal/Alertness: Awake/alert Behavior During Therapy: WFL for tasks assessed/performed Overall Cognitive Status: No family/caregiver present to determine baseline cognitive functioning                                 General Comments: Pt with improved alertness this date. A&Ox3. Able to provide more information regarding PLOF (see below)                     Pertinent Vitals/ Pain       Pain Assessment: No/denies pain  Home Living Family/patient expects to be discharged to:: Skilled nursing facility                                 Additional Comments: Patient is from [facility] he lives          Frequency  Min 2X/week        Progress Toward Goals  OT Goals(current goals can now be found in the care plan section)  Progress towards OT goals: Progressing toward goals  Acute Rehab OT Goals OT Goal Formulation: With patient Time For Goal Achievement: 01/29/21 Potential to Achieve Goals: Good  Plan Discharge plan remains appropriate;Frequency remains appropriate       AM-PAC OT "6 Clicks" Daily Activity     Outcome Measure   Help from another person eating meals?: A Lot Help from another person taking care of personal grooming?: A Lot Help from another person toileting, which includes using toliet, bedpan, or urinal?: Total Help from another person bathing (including washing, rinsing, drying)?: Total Help from another person to put on and taking off regular upper body clothing?: A Lot Help from another person to put on and taking off regular lower body clothing?: A Lot 6 Click Score: 10    End of Session    OT Visit Diagnosis: Muscle weakness (generalized) (M62.81);Unsteadiness on feet (R26.81);Other symptoms and signs involving cognitive function   Activity Tolerance Patient tolerated treatment well   Patient Left in bed;with call  bell/phone within reach;with nursing/sitter in room;with bed alarm set   Nurse Communication Mobility status        Time: 1102-1117 OT Time Calculation (min): 35 min  Charges: OT General Charges $OT Visit: 1 Visit OT Treatments $Self Care/Home Management : 23-37 mins  Fredirick Maudlin, [place]

## 2021-01-18 NOTE — TOC Initial Note (Signed)
Transition of Care [facility]) - Initial/Assessment Note    Patient Details  Name: Jared Gregory MRN: [id number] Date of Birth: [birth year]  Transition of Care [facility] CM/SW Contact:    Pete Pelt, RN Phone Number: 01/18/2021, 1:52 PM  Clinical Narrative:    Patient states he lives in a facility, and he wishes to return to facility on discharge.  He was not sure of the location of the facility.  Contacted patient's wife and she stated patient lives at [facility] in [place],  [place].  She plans to have patient return there, but has some concerns to address with facility prior to patient's return.  She reached out to facility manager and is waiting for a call back.  TOC contact information provided, TOC to follow to discharge.               Expected Discharge Plan: [place]     Patient Goals and CMS Choice     Choice offered to / list presented to : NA  Expected Discharge Plan and Services Expected Discharge Plan: [place]   Discharge Planning Services: CM Consult   Living arrangements for the past 2 months: [place]                                      Prior Living Arrangements/Services Living arrangements for the past 2 months: [place] Lives with:: Facility Resident Patient language and need for interpreter reviewed:: Yes (no interpreter required) Do you feel safe going back to the place where you live?: Yes               Activities of Daily Living      Permission Sought/Granted Permission sought to share information with : Facility Sport and exercise psychologist, Case Optician, dispensing granted to share information with : Yes, Verbal Permission Granted              Emotional Assessment Appearance:: Appears stated age Attitude/Demeanor/Rapport: Gracious, Engaged Affect (typically observed): Pleasant, Appropriate Orientation: : Oriented to Self, Oriented to Situation Alcohol / Substance  Use: Not Applicable Psych Involvement: No (comment)  Admission diagnosis:  Seizure ([place]) [R56.9] Seizure-like activity ([place]) [R56.9] Syncope, unspecified syncope type [R55] Patient Active Problem List   Diagnosis Date Noted   AKI (acute kidney injury) ([place])    Renal transplant recipient    Syncope    Seizure-like activity ([place]) 01/10/2021   Depression 01/10/2021   Essential hypertension 01/10/2021   On continuous oral anticoagulation 01/10/2021   BPH (benign prostatic hyperplasia) 01/10/2021   PCP:  [facility], [place], [place] [address] Phone: [phone] Fax: [phone]     Social Determinants of Health (SDOH) Interventions    Readmission Risk Interventions Readmission Risk Prevention Plan 01/18/2021 01/18/2021  Transportation Screening (No Data) Complete  PCP or Specialist Appt within 3-5 Days - Complete  HRI or [place] - Complete  Social Work Consult for [place] Planning/Counseling - Not Complete  SW consult not completed comments - RNCM assigned  Palliative Care Screening - Complete  Medication Review Press photographer) - Complete

## 2021-01-18 NOTE — Progress Notes (Signed)
PROGRESS NOTE    Jared Gregory  TKZ:[id number] DOB: 1955-02-21 DOA: 01/10/2021 [facility]CP: [facility]   Brief Narrative: 66 year old with past medical history significant for anxiety/depression, HLD, A. fib on Eliquis, CVA, seizures on lacosamide, ESRD status post kidney transplant 5 years ago presented from a SNF to the ED with seizure.  In the ED, patient was found to be in a postictal state.  Neurology was consulted.  They treated patient with IV Keppra and home dose of lacosamide.  Patient also developed acute respiratory failure secondary to CAP/COPD.  Hypertensive urgency was managed medically.  Patient was also found to have UTI and worsening renal function.  Legionella antigen came back positive.  ID is  recommending 10 total days of azithromycin.  Patient to complete treatment 1/16.   Assessment & Plan:   Principal Problem:   Seizure-like activity ([place]) Active Problems:   Depression   Essential hypertension   On continuous oral anticoagulation   BPH (benign prostatic hyperplasia)   AKI (acute kidney injury) ([place])   Renal transplant recipient   Syncope  1-Seizure disorder: Patient admitted with seizure. EEG was negative for epilepsy. Neurology consulted and following. Lacosamide was increase to  150 mg twice daily.   Received one dose of Keppra.   2-Acute hypoxic respiratory failure: Secondary  to community-acquired pneumonia aspiration pneumonitis, Legionella Pneumonia: He Will require 10 days total of azithromycin.  Last dose 1/16. Legionella antigen positive.   3-COPD: Acute exacerbation. : Received IV Solu-Medrol.  On prednisone taper  4-Hypertensive urgency: Continue with amlodipine and labetalol.  He received nicardipine drip. Continue to hold losartan  5-Urinary tract infection: Urine culture grew Citrobacter and Klebsiella Had a Foley in place due to acute urinary retention Continue with IV ceftriaxone.  Need to follow-up with urology He  recommended 7 days of antibiotics.  6 Elevation of troponin: Suspect demand ischemia. 7-AKI on CKD stage IV s/p renal transplant Plan creatinine 2.7--- 2.9 New with cyclosporine and mycophenolate Oral bicarbonate Patient nephrology follow-up.  History of CVA: Residual deficit upper extremity: MRI of brain no acute infarct.  Continue with Eliquis and Lipitor  Anemia of chronic kidney disease: Received 1 unit of packed red blood cells 1/7. Folic acid deficiency, started on folic acid supplement Hb stable.   Depression anxiety: Continue with duloxetine Atarax as needed  Persistent A. fib: Continue with Eliquis He was on Cardizem drip  Hypomagnesemia;: Replace.        Estimated body mass index is 23.05 kg/m as calculated from the following:   Height as of this encounter: 5\' 9"  (1.753 m).   Weight as of this encounter: 70.8 kg.   DVT prophylaxis: Eliquis Code Status: Full code Family Communication: no family at bedside Disposition Plan:  Status is: Inpatient  Remains inpatient appropriate because: Admitted with seizure, AKI, pneumonia.  Plan to transfer to rehab 1/12        Consultants:  Nephrology ID Procedures:    Antimicrobials:    Subjective: He is alert, feels better. No complaints.   Objective: Vitals:   01/17/21 1648 01/17/21 2044 01/18/21 0428 01/18/21 0750  BP: (!) 153/68 116/73 133/65 (!) 141/84  Pulse: 76 72 73 71  Resp: 19 16 16 18   Temp: 98 F (36.7 C) 97.7 F (36.5 C) 97.9 F (36.6 C) 98.6 F (37 C)  TempSrc: Oral Oral Oral Oral  SpO2: 96% 100% 100% 100%  Weight:      Height:        Intake/Output Summary (Last  24 hours) at 01/18/2021 0758 Last data filed at 01/18/2021 0428 Gross per 24 hour  Intake 1275.04 ml  Output 1100 ml  Net 175.04 ml   Filed Weights   01/10/21 1926 01/14/21 1152  Weight: 76.2 kg 70.8 kg    Examination:  General exam: Appears calm and comfortable  Respiratory system: Clear to auscultation.  Respiratory effort normal. Cardiovascular system: S1 & S2 heard, IRR Gastrointestinal system: Abdomen is nondistended, soft and nontender. No organomegaly or masses felt. Normal bowel sounds heard. Central nervous system: Alert and oriented. Follows comamnd Extremities: no edema     Data Reviewed: I have personally reviewed following labs and imaging studies  CBC: Recent Labs  Lab 01/14/21 0706 01/15/21 0303 01/16/21 0442 01/17/21 0618 01/18/21 0503  WBC 3.5* 4.9 7.0 7.5 6.9  HGB 7.1* 9.2* 9.0* 8.8* 9.1*  HCT 22.4* 27.7* 28.1* 27.2* 28.8*  MCV 75.7* 76.3* 77.8* 76.8* 77.6*  PLT 224 214 201 180 829   Basic Metabolic Panel: Recent Labs  Lab 01/14/21 0706 01/15/21 0303 01/16/21 0442 01/17/21 0618 01/18/21 0503  NA 135 139 141 139 141  K 3.9 3.6 3.6 3.8 4.0  CL 106 106 110 108 114*  CO2 18* 19* 22 21* 20*  GLUCOSE 164* 237* 189* 163* 202*  BUN 81* 85* 86* 81* 78*  CREATININE 3.12* 3.23* 3.25* 3.08* 2.78*  CALCIUM 8.5* 8.3* 8.4* 8.0* 8.2*  MG 1.8 1.8 1.8 1.6* 2.0  PHOS 6.1* 5.6* 5.0* 4.3 3.4   GFR: Estimated Creatinine Clearance: 26.8 mL/min (A) (by C-G formula based on SCr of 2.78 mg/dL (H)). Liver Function Tests: No results for input(s): AST, ALT, ALKPHOS, BILITOT, PROT, ALBUMIN in the last 168 hours. No results for input(s): LIPASE, AMYLASE in the last 168 hours. No results for input(s): AMMONIA in the last 168 hours. Coagulation Profile: No results for input(s): INR, PROTIME in the last 168 hours. Cardiac Enzymes: No results for input(s): CKTOTAL, CKMB, CKMBINDEX, TROPONINI in the last 168 hours. BNP (last 3 results) No results for input(s): PROBNP in the last 8760 hours. HbA1C: No results for input(s): HGBA1C in the last 72 hours. CBG: Recent Labs  Lab 01/17/21 0852 01/17/21 1238 01/17/21 1648 01/17/21 2113 01/18/21 0749  GLUCAP 144* 172* 251* 243* 166*   Lipid Profile: No results for input(s): CHOL, HDL, LDLCALC, TRIG, CHOLHDL, LDLDIRECT in the  last 72 hours. Thyroid Function Tests: No results for input(s): TSH, T4TOTAL, FREET4, T3FREE, THYROIDAB in the last 72 hours. Anemia Panel: No results for input(s): VITAMINB12, FOLATE, FERRITIN, TIBC, IRON, RETICCTPCT in the last 72 hours. Sepsis Labs: Recent Labs  Lab 01/14/21 0705 01/15/21 0303  PROCALCITON 0.17 0.14    Recent Results (from the past 240 hour(s))  Resp Panel by RT-PCR (Flu A&B, Covid) Nasopharyngeal Swab     Status: None   Collection Time: 01/11/21  1:11 AM   Specimen: Nasopharyngeal Swab; Nasopharyngeal(NP) swabs in vial transport medium  Result Value Ref Range Status   SARS Coronavirus 2 by RT PCR NEGATIVE NEGATIVE Final    Comment: (NOTE) SARS-CoV-2 target nucleic acids are NOT DETECTED.  The SARS-CoV-2 RNA is generally detectable in upper respiratory specimens during the acute phase of infection. The lowest concentration of SARS-CoV-2 viral copies this assay can detect is 138 copies/mL. A negative result does not preclude SARS-Cov-2 infection and should not be used as the sole basis for treatment or other patient management decisions. A negative result may occur with  improper specimen collection/handling, submission of specimen other than nasopharyngeal  swab, presence of viral mutation(s) within the areas targeted by this assay, and inadequate number of viral copies(<138 copies/mL). A negative result must be combined with clinical observations, patient history, and epidemiological information. The expected result is Negative.  Fact Sheet for Patients:  EntrepreneurPulse.com.au  Fact Sheet for Healthcare Providers:  IncredibleEmployment.be  This test is no t yet approved or cleared by the [place] FDA and  has been authorized for detection and/or diagnosis of SARS-CoV-2 by FDA under an Emergency Use Authorization (EUA). This EUA will remain  in effect (meaning this test can be used) for the duration of  the COVID-19 declaration under Section 564(b)(1) of the Act, 21 U.S.C.section 360bbb-3(b)(1), unless the authorization is terminated  or revoked sooner.       Influenza A by PCR NEGATIVE NEGATIVE Final   Influenza B by PCR NEGATIVE NEGATIVE Final    Comment: (NOTE) The Xpert Xpress SARS-CoV-2/FLU/RSV plus assay is intended as an aid in the diagnosis of influenza from Nasopharyngeal swab specimens and should not be used as a sole basis for treatment. Nasal washings and aspirates are unacceptable for Xpert Xpress SARS-CoV-2/FLU/RSV testing.  Fact Sheet for Patients: EntrepreneurPulse.com.au  Fact Sheet for Healthcare Providers: IncredibleEmployment.be  This test is not yet approved or cleared by the [place] FDA and has been authorized for detection and/or diagnosis of SARS-CoV-2 by FDA under an Emergency Use Authorization (EUA). This EUA will remain in effect (meaning this test can be used) for the duration of the COVID-19 declaration under Section 564(b)(1) of the Act, 21 U.S.C. section 360bbb-3(b)(1), unless the authorization is terminated or revoked.  Performed at [facility], [address] [place], [place] [address]   Urine Culture     Status: Abnormal   Collection Time: 01/13/21  9:48 AM   Specimen: In/Out Cath Urine  Result Value Ref Range Status   Specimen Description   Final    IN/OUT CATH URINE Performed at [facility], [address] [place], [place] [address]    Special Requests   Final    NONE Performed at [facility], [address] [place], [place] [address]    Culture (A)  Final    >=100,000 COLONIES/mL CITROBACTER SPECIES >=100,000 COLONIES/mL KLEBSIELLA PNEUMONIAE    Report Status 01/16/2021 FINAL  Final   Organism ID, Bacteria CITROBACTER SPECIES (A)  Final   Organism ID, Bacteria KLEBSIELLA PNEUMONIAE (A)  Final      Susceptibility   Citrobacter species - MIC*     CEFAZOLIN >=64 RESISTANT Resistant     CEFEPIME <=0.12 SENSITIVE Sensitive     CEFTRIAXONE <=0.25 SENSITIVE Sensitive     CIPROFLOXACIN <=0.25 SENSITIVE Sensitive     GENTAMICIN <=1 SENSITIVE Sensitive     IMIPENEM 0.5 SENSITIVE Sensitive     NITROFURANTOIN <=16 SENSITIVE Sensitive     TRIMETH/SULFA <=20 SENSITIVE Sensitive     PIP/TAZO <=4 SENSITIVE Sensitive     * >=100,000 COLONIES/mL CITROBACTER SPECIES   Klebsiella pneumoniae - MIC*    AMPICILLIN >=32 RESISTANT Resistant     CEFAZOLIN <=4 SENSITIVE Sensitive     CEFEPIME <=0.12 SENSITIVE Sensitive     CEFTRIAXONE <=0.25 SENSITIVE Sensitive     CIPROFLOXACIN <=0.25 SENSITIVE Sensitive     GENTAMICIN <=1 SENSITIVE Sensitive     IMIPENEM 0.5 SENSITIVE Sensitive     NITROFURANTOIN 128 RESISTANT Resistant     TRIMETH/SULFA <=20 SENSITIVE Sensitive     AMPICILLIN/SULBACTAM 8 SENSITIVE Sensitive     PIP/TAZO <=4 SENSITIVE  Sensitive     * >=100,000 COLONIES/mL KLEBSIELLA PNEUMONIAE  Respiratory (~20 pathogens) panel by PCR     Status: None   Collection Time: 01/13/21  1:59 PM   Specimen: Nasopharyngeal Swab; Respiratory  Result Value Ref Range Status   Adenovirus NOT DETECTED NOT DETECTED Final   Coronavirus 229E NOT DETECTED NOT DETECTED Final    Comment: (NOTE) The Coronavirus on the Respiratory Panel, DOES NOT test for the novel  Coronavirus (2019 nCoV)    Coronavirus HKU1 NOT DETECTED NOT DETECTED Final   Coronavirus NL63 NOT DETECTED NOT DETECTED Final   Coronavirus OC43 NOT DETECTED NOT DETECTED Final   Metapneumovirus NOT DETECTED NOT DETECTED Final   Rhinovirus / Enterovirus NOT DETECTED NOT DETECTED Final   Influenza A NOT DETECTED NOT DETECTED Final   Influenza B NOT DETECTED NOT DETECTED Final   Parainfluenza Virus 1 NOT DETECTED NOT DETECTED Final   Parainfluenza Virus 2 NOT DETECTED NOT DETECTED Final   Parainfluenza Virus 3 NOT DETECTED NOT DETECTED Final   Parainfluenza Virus 4 NOT DETECTED NOT DETECTED Final    Respiratory Syncytial Virus NOT DETECTED NOT DETECTED Final   Bordetella pertussis NOT DETECTED NOT DETECTED Final   Bordetella Parapertussis NOT DETECTED NOT DETECTED Final   Chlamydophila pneumoniae NOT DETECTED NOT DETECTED Final   Mycoplasma pneumoniae NOT DETECTED NOT DETECTED Final    Comment: Performed at [facility], [address] [place], [place] [address]  Culture, blood (Routine X 2) w Reflex to ID Panel     Status: None (Preliminary result)   Collection Time: 01/14/21  7:05 AM   Specimen: BLOOD RIGHT HAND  Result Value Ref Range Status   Specimen Description BLOOD RIGHT HAND  Final   Special Requests   Final    BOTTLES DRAWN AEROBIC AND ANAEROBIC Blood Culture adequate volume   Culture   Final    NO GROWTH 4 DAYS Performed at [facility], [address], [place], [place] [address]    Report Status PENDING  Incomplete  Culture, blood (Routine X 2) w Reflex to ID Panel     Status: None (Preliminary result)   Collection Time: 01/14/21  7:11 AM   Specimen: BLOOD RIGHT HAND  Result Value Ref Range Status   Specimen Description BLOOD RIGHT HAND  Final   Special Requests   Final    BOTTLES DRAWN AEROBIC ONLY Blood Culture adequate volume   Culture   Final    NO GROWTH 4 DAYS Performed at [facility], [address], [place], [place] [address]    Report Status PENDING  Incomplete  MRSA Next Gen by PCR, Nasal     Status: None   Collection Time: 01/14/21 11:53 AM   Specimen: Nasal Mucosa; Nasal Swab  Result Value Ref Range Status   MRSA by PCR Next Gen NOT DETECTED NOT DETECTED Final    Comment: (NOTE) The GeneXpert MRSA Assay (FDA approved for NASAL specimens only), is one component of a comprehensive MRSA colonization surveillance program. It is not intended to diagnose MRSA infection nor to guide or monitor treatment for MRSA infections. Test performance is not FDA approved in patients less than 51 years old. Performed at [facility], [address] [place], [place] [address]          Radiology Studies: US RENAL  Result Date: 01/16/2021 CLINICAL DATA:  Acute renal insufficiency. EXAM: RENAL / URINARY TRACT ULTRASOUND COMPLETE COMPARISON:  None. FINDINGS: Right Kidney: Renal measurements: 7.4  x 3.1 x 3.8 cm = volume: 46 mL. Moderate parenchyma atrophy and increased echogenicity. No hydronephrosis or shadowing stone. Subcentimeter lower pole cyst. Left Kidney: The left kidney is poorly visualized. There is a right lower quadrant renal transplant measuring 10.9 x 6.7 x 5.5 cm. No hydronephrosis or peritransplant collection. The renal transplant demonstrates a normal echogenicity. Bladder: The urinary bladder is decompressed around a Foley catheter. Diffusely thickened bladder wall may be partly related to underdistention. Correlation with urinalysis recommended to exclude cystitis. Other: There is a small ascites and a small right pleural effusion. IMPRESSION: 1. Atrophic and echogenic right kidney in keeping with chronic kidney disease. 2. Nonvisualization of the left kidney. 3. Unremarkable right lower quadrant renal transplant. 4. Small right pleural effusion and small ascites. Electronically Signed   By: Anner Crete M.D.   On: 01/16/2021 20:00        Scheduled Meds:  amLODipine  10 mg Oral Daily   apixaban  5 mg Oral BID   atorvastatin  40 mg Oral QHS   Chlorhexidine Gluconate Cloth  6 each Topical Q0600   cholecalciferol  2,000 Units Oral Daily   cycloSPORINE  75 mg Oral BID   DULoxetine  60 mg Oral Daily   finasteride  5 mg Oral Daily   fluticasone furoate-vilanterol  1 puff Inhalation Daily   folic acid  1 mg Oral Daily   insulin aspart  0-5 Units Subcutaneous QHS   insulin aspart  0-9 Units Subcutaneous TID WC   labetalol  200 mg Oral TID   linagliptin  5 mg Oral Daily   mouth rinse  15 mL Mouth Rinse BID   mycophenolate  180 mg Oral BID   polyethylene glycol  17 g Oral Daily   predniSONE  20 mg  Oral Q breakfast   Followed by   Derrill Memo ON 01/19/2021] predniSONE  10 mg Oral Q breakfast   sodium bicarbonate  650 mg Oral TID   tamsulosin  0.8 mg Oral QPC supper   Continuous Infusions:  sodium chloride 75 mL/hr at 01/18/21 0424   cefTRIAXone (ROCEPHIN)  IV 2 g (01/17/21 1308)   lacosamide (VIMPAT) IV 150 mg (01/17/21 2303)     LOS: 7 days    Time spent: 35 minutes.     Elmarie Shiley, MD [facility]   If 7PM-7AM, please contact night-coverage [email or web address]  01/18/2021, 7:58 AM

## 2021-01-18 NOTE — Progress Notes (Signed)
PT Cancellation Note  Patient Details Name: Loghan Kurtzman MRN: [id number] DOB: 1955/07/13   Cancelled Treatment:    Reason Eval/Treat Not Completed: Other (comment) Patient refused physical therapy as he wanted to finish his lunch, will re-attempt at a later time/date as available. Thank you!    Iva Boop, PT  01/18/21. 1:15 PM

## 2021-01-19 LAB — CBC
HCT: 27.6 % — ABNORMAL LOW (ref 39.0–52.0)
Hemoglobin: 8.7 g/dL — ABNORMAL LOW (ref 13.0–17.0)
MCH: 24.8 pg — ABNORMAL LOW (ref 26.0–34.0)
MCHC: 31.5 g/dL (ref 30.0–36.0)
MCV: 78.6 fL — ABNORMAL LOW (ref 80.0–100.0)
Platelets: 188 10*3/uL (ref 150–400)
RBC: 3.51 MIL/uL — ABNORMAL LOW (ref 4.22–5.81)
RDW: 24 % — ABNORMAL HIGH (ref 11.5–15.5)
WBC: 8.3 10*3/uL (ref 4.0–10.5)
nRBC: 0 % (ref 0.0–0.2)

## 2021-01-19 LAB — CULTURE, BLOOD (ROUTINE X 2)
Culture: NO GROWTH
Culture: NO GROWTH
Special Requests: ADEQUATE
Special Requests: ADEQUATE

## 2021-01-19 LAB — BASIC METABOLIC PANEL
Anion gap: 8 (ref 5–15)
BUN: 72 mg/dL — ABNORMAL HIGH (ref 8–23)
CO2: 20 mmol/L — ABNORMAL LOW (ref 22–32)
Calcium: 8.2 mg/dL — ABNORMAL LOW (ref 8.9–10.3)
Chloride: 112 mmol/L — ABNORMAL HIGH (ref 98–111)
Creatinine, Ser: 2.65 mg/dL — ABNORMAL HIGH (ref 0.61–1.24)
GFR, Estimated: 26 mL/min — ABNORMAL LOW (ref >60)
Glucose, Bld: 174 mg/dL — ABNORMAL HIGH (ref 70–99)
Potassium: 4.2 mmol/L (ref 3.5–5.1)
Sodium: 140 mmol/L (ref 135–145)

## 2021-01-19 LAB — GLUCOSE, CAPILLARY
Glucose-Capillary: 145 mg/dL — ABNORMAL HIGH (ref 70–99)
Glucose-Capillary: 136 mg/dL — ABNORMAL HIGH (ref 70–99)
Glucose-Capillary: 201 mg/dL — ABNORMAL HIGH (ref 70–99)

## 2021-01-19 MED ORDER — FLUTICASONE FUROATE-VILANTEROL 200-25 MCG/ACT IN AEPB: 1.0000 | INHALATION_SPRAY | Freq: Every day | RESPIRATORY_TRACT | 0 refills | Status: DC

## 2021-01-19 MED ORDER — AZITHROMYCIN 500 MG PO TABS: 500.0000 mg | ORAL_TABLET | Freq: Every day | ORAL | 0 refills | Status: AC

## 2021-01-19 MED ORDER — FOLIC ACID 1 MG PO TABS: 1.0000 mg | ORAL_TABLET | Freq: Every day | ORAL | 0 refills | Status: DC

## 2021-01-19 MED ORDER — LINAGLIPTIN 5 MG PO TABS: 5.0000 mg | ORAL_TABLET | Freq: Every day | ORAL | 0 refills | Status: DC

## 2021-01-19 MED ORDER — LABETALOL HCL 200 MG PO TABS: 200.0000 mg | ORAL_TABLET | Freq: Three times a day (TID) | ORAL | 0 refills | Status: DC

## 2021-01-19 MED ORDER — POLYETHYLENE GLYCOL 3350 17 G PO PACK: 17.0000 g | PACK | Freq: Every day | ORAL | 0 refills | Status: DC

## 2021-01-19 MED ORDER — LACOSAMIDE 150 MG PO TABS: 150.0000 mg | ORAL_TABLET | Freq: Two times a day (BID) | ORAL | 0 refills | Status: DC

## 2021-01-19 NOTE — Progress Notes (Signed)
Occupational Therapy Treatment Patient Details Name: Jared Gregory MRN: [id number] DOB: 02-Feb-1955 Today's Date: 01/19/2021   History of present illness Jared Gregory is a 66 y.o. male with medical history significant for anxiety, depression, hyperlipidemia, atrial fibrillation on Coreg and Eliquis, cva in January 03, 2020, history of seizures on lacosamide, ESRD status post kidney transplant about 5 years ago in [place], who presents emergency department for chief concerns of seizure activity from facility [facility]).   OT comments  Pt seen for OT Tx this date to f/u re: safety with ADLs/ADL mobility. OT/PT co-treat completed to maximize services given decreased fxl activity tolerance. Pt requires MOD A +2 for sup<>sit and intermittent MIN to MOD A For static sitting balance. OT engages pt in self-feeding while sitting EOB (heaving L lateral lean, but sat up with support from elevated HOB And pillows, able to use stronger R hand to participate in meal time). Pt requires MIN A for self-feeding, is able to progress to SETUP with viscous foods such as pudding that sticks to the spoon, but anticipate pt might have increased difficulty with less viscous items such as soups, cereal, etc. Pt returned back to bed end of session with MOD A +2. He requires MAX A +2 for repositioning. Tolerates EOB sitting x~8-9 minutes. Requires increased time for all aspects of bed mobility d/t weakness and low tolerance. Pt left with all needs met and in reach. CNA presenting for bathing at end of session. Will continue to follow acutely.    Recommendations for follow up therapy are one component of a multi-disciplinary discharge planning process, led by the attending physician.  Recommendations may be updated based on patient status, additional functional criteria and insurance authorization.    Follow Up Recommendations  Skilled nursing-short term rehab (<3 hours/day)    Assistance Recommended at Discharge Frequent  or constant Supervision/Assistance  Patient can return home with the following  Two people to help with bathing/dressing/bathroom;Assistance with feeding;Two people to help with walking and/or transfers   Equipment Recommendations  Other (comment) (defer)    Recommendations for Other Services      Precautions / Restrictions Precautions Precautions: Fall Precaution Comments: seizure Restrictions Weight Bearing Restrictions: No       Mobility Bed Mobility Overal bed mobility: Needs Assistance Bed Mobility: Supine to Sit;Sit to Supine     Supine to sit: Mod assist;+2 for physical assistance Sit to supine: Mod assist;+2 for physical assistance   General bed mobility comments: able to assist bringing Les toward edge of bed. Requires +2 for raising trunk to seated position. Patient has heavy left lean in sitting    Transfers                   General transfer comment: not attempted due to poor sitting balance and tolerance, heavy R lateral lean, states using stand lift at facility at baseline     Balance Overall balance assessment: Needs assistance Sitting-balance support: Feet supported;Single extremity supported Sitting balance-Leahy Scale: Poor Sitting balance - Comments: Able to maintain static sitting balance EOB briefly without support before leaning to L side and requiring MOD A to return to midline position Postural control: Left lateral lean     Standing balance comment: not safe to attempt considering pt's segnificant L lateral lean in sitting, anticiapte he will need DME to CTS which he reports using at baseline at facility.  ADL either performed or assessed with clinical judgement   ADL Overall ADL's : Needs assistance/impaired Eating/Feeding: Minimal assistance;Sitting Eating/Feeding Details (indicate cue type and reason): seated EOB with pillows and HOB setup for sitting support, OT provides built up utensils.                                         Extremity/Trunk Assessment Upper Extremity Assessment Upper Extremity Assessment: RUE deficits/detail;LUE deficits/detail RUE Deficits / Details: stronger R UE for UB ADLs, grip still limited, but able to use built up utensils supplied this session LUE Deficits / Details: edematous and very limited strength/ROM, Grip 3/5            Vision Patient Visual Report: No change from baseline     Perception     Praxis      Cognition Arousal/Alertness: Awake/alert Behavior During Therapy: WFL for tasks assessed/performed Overall Cognitive Status: No family/caregiver present to determine baseline cognitive functioning                                 General Comments: Patient tearful some during this session. Alert, cooperative.          Exercises Other Exercises Other Exercises: OT ed with pt re: use of adaptive utensils, in addition discussed with aide and RN for continuity of care   Shoulder Instructions       General Comments      Pertinent Vitals/ Pain       Pain Assessment: No/denies pain  Home Living                                          Prior Functioning/Environment              Frequency  Min 2X/week        Progress Toward Goals  OT Goals(current goals can now be found in the care plan section)     Acute Rehab OT Goals Patient Stated Goal: to get out of hospital OT Goal Formulation: With patient Time For Goal Achievement: 01/29/21 Potential to Achieve Goals: Good  Plan      Co-evaluation                 AM-PAC OT "6 Clicks" Daily Activity     Outcome Measure   Help from another person eating meals?: A Lot Help from another person taking care of personal grooming?: A Lot Help from another person toileting, which includes using toliet, bedpan, or urinal?: Total Help from another person bathing (including washing, rinsing, drying)?: Total Help from  another person to put on and taking off regular upper body clothing?: A Lot Help from another person to put on and taking off regular lower body clothing?: A Lot 6 Click Score: 10    End of Session    OT Visit Diagnosis: Muscle weakness (generalized) (M62.81);Unsteadiness on feet (R26.81);Other symptoms and signs involving cognitive function   Activity Tolerance Patient tolerated treatment well   Patient Left in bed;with call bell/phone within reach;with nursing/sitter in room;with bed alarm set   Nurse Communication Mobility status        Time: 1010-1036 OT Time Calculation (min): 26 min  Charges: OT General Charges $OT Visit: 1 Visit  OT Treatments $Self Care/Home Management : 8-22 mins  Gerrianne Scale, MS, OTR/L ascom [phone] 01/19/21, 3:01 PM

## 2021-01-19 NOTE — NC FL2 (Signed)
[place] LEVEL OF CARE SCREENING TOOL     IDENTIFICATION  Patient Name: Jared Gregory Birthdate: May 30, 1955 Sex: male Admission Date (Current Location): 01/10/2021  [facility] and [place] Number:  Engineering geologist and Address:  [facility], [address], [place], [place] [address]      Provider Number: [id number]  Attending Physician Name and Address:  Elmarie Shiley, MD  Relative Name and Phone Number:  SIMONE, TUCKEY (Spouse)   [phone] [facility])    Current Level of Care: Hospital Recommended Level of Care: [place] Prior Approval Number:    Date Approved/Denied:   PASRR Number: [id number]  Discharge Plan: SNF    Current Diagnoses: Patient Active Problem List   Diagnosis Date Noted   AKI (acute kidney injury) [facility])    Renal transplant recipient    Syncope    Seizure-like activity ([place]) 01/10/2021   Depression 01/10/2021   Essential hypertension 01/10/2021   On continuous oral anticoagulation 01/10/2021   BPH (benign prostatic hyperplasia) 01/10/2021    Orientation RESPIRATION BLADDER Height & Weight     Self  Normal Incontinent, External catheter Weight: 70.8 kg Height:  5\' 9"  (175.3 cm)  BEHAVIORAL SYMPTOMS/MOOD NEUROLOGICAL BOWEL NUTRITION STATUS      Continent Diet (heart)  AMBULATORY STATUS COMMUNICATION OF NEEDS Skin     Verbally Normal                       Personal Care Assistance Level of Assistance  Bathing, Feeding, Dressing Bathing Assistance: Maximum assistance Feeding assistance: Limited assistance Dressing Assistance: Maximum assistance     Functional Limitations Info  Sight, Speech, Hearing Sight Info: Adequate Hearing Info: Adequate Speech Info: Adequate    SPECIAL CARE FACTORS FREQUENCY   (Seizure precs)                    Contractures Contractures Info: Not present    Additional Factors Info  Code Status, Allergies Code Status Info: FULL  CODE Allergies Info: Baclofen Not Specified     Lisinopril Not Specified     Remeron (mirtazapine) Not Specified     Vancomycin Not Specified     Zofran (ondansetron)           Current Medications (01/19/2021):  This is the current hospital active medication list Current Facility-Administered Medications  Medication Dose Route Frequency Provider Last Rate Last Admin   0.9 %  sodium chloride infusion   Intravenous Continuous Val Riles, MD 75 mL/hr at 01/18/21 0424 New Bag at 01/18/21 0424   amLODipine (NORVASC) tablet 10 mg  10 mg Oral Daily Val Riles, MD   10 mg at 01/19/21 0829   apixaban (ELIQUIS) tablet 5 mg  5 mg Oral BID Cox, Amy N, DO   5 mg at 01/19/21 9357   atorvastatin (LIPITOR) tablet 40 mg  40 mg Oral QHS Cox, Amy N, DO   40 mg at 01/18/21 2100   azithromycin (ZITHROMAX) tablet 500 mg  500 mg Oral Daily Tsosie Billing, MD   500 mg at 01/19/21 0177   bisacodyl (DULCOLAX) EC tablet 10 mg  10 mg Oral Daily PRN Val Riles, MD       bisacodyl (DULCOLAX) suppository 10 mg  10 mg Rectal QHS PRN Val Riles, MD       cefTRIAXone (ROCEPHIN) 2 g in sodium chloride 0.9 % 100 mL IVPB  2 g Intravenous Q24H Berton Mount, RPH 200 mL/hr at 01/19/21 1304  2 g at 01/19/21 1304   Chlorhexidine Gluconate Cloth 2 % PADS 6 each  6 each Topical Q0600 Val Riles, MD   6 each at 01/19/21 0834   cholecalciferol (VITAMIN D3) tablet 2,000 Units  2,000 Units Oral Daily Cox, Amy N, DO   2,000 Units at 01/19/21 9509   cycloSPORINE (SANDIMMUNE) capsule 75 mg  75 mg Oral BID Val Riles, MD   75 mg at 01/19/21 0844   diltiazem (CARDIZEM) tablet 30 mg  30 mg Oral Q6H PRN Val Riles, MD       DULoxetine (CYMBALTA) DR capsule 60 mg  60 mg Oral Daily Cox, Amy N, DO   60 mg at 01/19/21 0824   finasteride (PROSCAR) tablet 5 mg  5 mg Oral Daily Val Riles, MD   5 mg at 01/19/21 0829   fluticasone furoate-vilanterol (BREO ELLIPTA) 200-25 MCG/ACT 1 puff  1 puff Inhalation Daily Val Riles, MD   1 puff at 32/67/12 4580   folic acid (FOLVITE) tablet 1 mg  1 mg Oral Daily Val Riles, MD   1 mg at 01/19/21 0825   haloperidol lactate (HALDOL) injection 2 mg  2 mg Intravenous Q6H PRN Bradly Bienenstock, NP   2 mg at 01/13/21 2342   hydrOXYzine (ATARAX) tablet 25 mg  25 mg Oral TID PRN Val Riles, MD   25 mg at 01/18/21 0905   insulin aspart (novoLOG) injection 0-5 Units  0-5 Units Subcutaneous QHS Cox, Amy N, DO   2 Units at 01/18/21 2059   insulin aspart (novoLOG) injection 0-9 Units  0-9 Units Subcutaneous TID WC Cox, Amy N, DO   1 Units at 01/19/21 1305   ipratropium-albuterol (DUONEB) 0.5-2.5 (3) MG/3ML nebulizer solution 3 mL  3 mL Nebulization Q6H PRN Val Riles, MD       labetalol (NORMODYNE) tablet 200 mg  200 mg Oral TID Val Riles, MD   200 mg at 01/19/21 0847   lacosamide (VIMPAT) tablet 150 mg  150 mg Oral BID Regalado, Belkys A, MD   150 mg at 01/19/21 0827   linagliptin (TRADJENTA) tablet 5 mg  5 mg Oral Daily Dorothe Pea, RPH   5 mg at 01/19/21 0845   LORazepam (ATIVAN) injection 2 mg  2 mg Intravenous Q4H PRN Bradly Bienenstock, NP       MEDLINE mouth rinse  15 mL Mouth Rinse BID Val Riles, MD   15 mL at 01/19/21 0848   mycophenolate (MYFORTIC) EC tablet 180 mg  180 mg Oral BID Cox, Amy N, DO   180 mg at 01/19/21 0845   ondansetron (ZOFRAN) tablet 4 mg  4 mg Oral Q6H PRN Cox, Amy N, DO       Or   ondansetron (ZOFRAN) injection 4 mg  4 mg Intravenous Q6H PRN Cox, Amy N, DO       polyethylene glycol (MIRALAX / GLYCOLAX) packet 17 g  17 g Oral Daily Val Riles, MD   17 g at 01/19/21 0830   sodium bicarbonate tablet 650 mg  650 mg Oral TID Val Riles, MD   650 mg at 01/19/21 0848   tamsulosin (FLOMAX) capsule 0.8 mg  0.8 mg Oral QPC supper Cox, Amy N, DO   0.8 mg at 01/18/21 1705     Discharge Medications: Please see discharge summary for a list of discharge medications.  Relevant Imaging Results:  Relevant Lab Results:   Additional  Information [id number]  Pete Pelt, RN

## 2021-01-19 NOTE — Plan of Care (Signed)

## 2021-01-19 NOTE — TOC Progression Note (Addendum)
Transition of Care [facility]) - Progression Note    Patient Details  Name: Jared Gregory MRN: [id number] Date of Birth: 29-Jan-1955  Transition of Care [facility]) CM/SW [place], RN Phone Number: 01/19/2021, 1:00 PM  Clinical Narrative:   Patient discharged to facility [facility] today.  He is a long term resident there.  Tried multiple times to reach patient's wife, however the voice mailbox was reporting as full and unable to leave a message.  Addendum 1314: Wife called back and she is aware he is returning to [facility] and is amenable.  Expected Discharge Plan: [place]    Expected Discharge Plan and Services Expected Discharge Plan: [place]   Discharge Planning Services: CM Consult   Living arrangements for the past 2 months: [place] Expected Discharge Date: 01/19/21                                     Social Determinants of Health (SDOH) Interventions    Readmission Risk Interventions Readmission Risk Prevention Plan 01/18/2021 01/18/2021  Transportation Screening (No Data) Complete  PCP or Specialist Appt within 3-5 Days - Complete  HRI or [place] - Complete  Social Work Consult for [place] Planning/Counseling - Not Complete  SW consult not completed comments - RNCM assigned  Palliative Care Screening - Complete  Medication Review Press photographer) - Complete

## 2021-01-19 NOTE — Progress Notes (Signed)
Physical Therapy Treatment Patient Details Name: Jared Gregory MRN: [id number] DOB: September 15, 1955 Today's Date: 01/19/2021   History of Present Illness Jared Gregory is a 66 y.o. male with medical history significant for anxiety, depression, hyperlipidemia, atrial fibrillation on Coreg and Eliquis, cva in January 03, 2020, history of seizures on lacosamide, ESRD status post kidney transplant about 5 years ago in [place], who presents emergency department for chief concerns of seizure activity from facility [facility]).    PT Comments    Patient received in supine. Breakfast tray in front of him that he has been unable to eat. Patient tearful this am. He is agreeable to PT/OT session. Patient requires mod +2 for supine to sit to raise trunk. Heavy left leaning in sitting. Patient was able to hold to end of bed to assist sitting balance to midline. Patient unable to attempt standing due to poor sitting balance. He will continue to benefit from skilled PT while here to improve functional independence, strength and safety with mobility.        Recommendations for follow up therapy are one component of a multi-disciplinary discharge planning process, led by the attending physician.  Recommendations may be updated based on patient status, additional functional criteria and insurance authorization.  Follow Up Recommendations  Skilled nursing-short term rehab (<3 hours/day)     Assistance Recommended at Discharge Intermittent Supervision/Assistance  Patient can return home with the following Two people to help with walking and/or transfers;A lot of help with bathing/dressing/bathroom;Assistance with feeding;Direct supervision/assist for medications management   Equipment Recommendations  None recommended by PT    Recommendations for Other Services       Precautions / Restrictions Precautions Precautions: Fall Precaution Comments: seizure Restrictions Weight Bearing Restrictions: No      Mobility  Bed Mobility Overal bed mobility: Needs Assistance Bed Mobility: Supine to Sit;Sit to Supine     Supine to sit: Mod assist;+2 for physical assistance Sit to supine: Mod assist;+2 for physical assistance   General bed mobility comments: able to assist bringing Les toward edge of bed. Requires +2 for raising trunk to seated position. Patient has heavy left lean in sitting    Transfers                   General transfer comment: not attempted due to poor sitting balance    Ambulation/Gait                   Stairs             Wheelchair Mobility    Modified Rankin (Stroke Patients Only) Modified Rankin (Stroke Patients Only) Pre-Morbid Rankin Score: Severe disability Modified Rankin: Severe disability     Balance Overall balance assessment: Needs assistance Sitting-balance support: Feet supported;Single extremity supported Sitting balance-Leahy Scale: Poor       Standing balance-Leahy Scale: Poor                              Cognition Arousal/Alertness: Awake/alert Behavior During Therapy: WFL for tasks assessed/performed Overall Cognitive Status: No family/caregiver present to determine baseline cognitive functioning                                 General Comments: Patient tearful this session. Alert, cooperative.        Exercises      General Comments  Pertinent Vitals/Pain Pain Assessment: No/denies pain    Home Living                          Prior Function            PT Goals (current goals can now be found in the care plan section) Acute Rehab PT Goals PT Goal Formulation: Patient unable to participate in goal setting Progress towards PT goals: Progressing toward goals    Frequency    Min 2X/week      PT Plan Current plan remains appropriate    Co-evaluation              AM-PAC PT "6 Clicks" Mobility   Outcome Measure  Help needed turning from  your back to your side while in a flat bed without using bedrails?: Total Help needed moving from lying on your back to sitting on the side of a flat bed without using bedrails?: Total Help needed moving to and from a bed to a chair (including a wheelchair)?: Total Help needed standing up from a chair using your arms (e.g., wheelchair or bedside chair)?: Total Help needed to walk in hospital room?: Total Help needed climbing 3-5 steps with a railing? : Total 6 Click Score: 6    End of Session   Activity Tolerance: Patient tolerated treatment well;Patient limited by fatigue Patient left: in bed;with call bell/phone within reach;with nursing/sitter in room Nurse Communication: Mobility status PT Visit Diagnosis: Other abnormalities of gait and mobility (R26.89);Muscle weakness (generalized) (M62.81);Other symptoms and signs involving the nervous system (R29.898)     Time: 1010-1036 PT Time Calculation (min) (ACUTE ONLY): 26 min  Charges:  $Therapeutic Activity: 8-22 mins                     Larose Batres, PT, GCS 01/19/21,10:55 AM

## 2021-01-19 NOTE — Discharge Summary (Addendum)
Physician Discharge Summary  Jared Gregory IOX:[id number] DOB: 14-Nov-1955 DOA: 01/10/2021  PCP: [facility]  Admit date: 01/10/2021 Discharge date: 01/19/2021  Admitted From: SNF Disposition:  SNF  Recommendations for Outpatient Follow-up:  Follow up with PCP in 1-2 weeks Please obtain BMP/CBC in one week. Monitor renal function.  Needs to follow up with urology    Discharge Condition: Stable.  CODE STATUS: Full code Diet recommendation: Heart Healthy   Brief/Interim Summary: 66 year old with past medical history significant for anxiety/depression, HLD, A. fib on Eliquis, CVA, seizures on lacosamide, ESRD status post kidney transplant 5 years ago presented from a SNF to the ED with seizure.  In the ED, patient was found to be in a postictal state.  Neurology was consulted.  They treated patient with IV Keppra and home dose of lacosamide.  Patient also developed acute respiratory failure secondary to CAP/COPD.  Hypertensive urgency was managed medically.  Patient was also found to have UTI and worsening renal function.   Legionella antigen came back positive.  ID is  recommending 10 total days of azithromycin.  Patient to complete treatment 1/16.  1-Seizure disorder: Patient admitted with seizure. EEG was negative for epilepsy. Neurology consulted and following. Lacosamide was increase to  150 mg twice daily.   Received one dose of Keppra.  No further episodes.   2-Acute hypoxic respiratory failure: Secondary  to community-acquired pneumonia aspiration pneumonitis, Legionella Pneumonia: He Will require 10 days total of azithromycin.  Last dose 1/16. Legionella antigen positive.    3-COPD: Acute exacerbation. : Received IV Solu-Medrol. Completed prednisone tapered.    4-Hypertensive urgency: Continue with amlodipine and labetalol.  He received nicardipine drip. Continue to hold losartan Stable.   5-Urinary tract infection: Urine culture grew Citrobacter and  Klebsiella Had a Foley in place due to acute urinary retention Continue with IV ceftriaxone.  Need to follow-up with urology He recommended 7 days of antibiotics. He will complete ceftriaxone today, day 7.    6 Elevation of troponin: Suspect demand ischemia. 7-AKI on CKD stage IV s/p renal transplant Plan creatinine 2.7--- 2.9--2.6 Continue  with cyclosporine and mycophenolate. Oral bicarbonate Needs to follow up with nephrology out patient.  Holding cozaar at discharge due to AKI.    History of CVA: Residual deficit upper extremity: MRI of brain no acute infarct.  Continue with Eliquis and Lipitor   Anemia of chronic kidney disease: Received 1 unit of packed red blood cells 1/7. Folic acid deficiency, started on folic acid supplement Hb stable.    Depression anxiety: Continue with duloxetine   Persistent A. fib: Continue with Eliquis He was on Cardizem drip   Hypomagnesemia;: Replaced           Discharge Diagnoses:  Principal Problem:   Seizure-like activity ([place]) Active Problems:   Depression   Essential hypertension   On continuous oral anticoagulation   BPH (benign prostatic hyperplasia)   AKI (acute kidney injury) [facility])   Renal transplant recipient   Syncope    Discharge Instructions  Discharge Instructions     Diet - low sodium heart healthy   Complete by: As directed    Increase activity slowly   Complete by: As directed       Allergies as of 01/19/2021       Reactions   Baclofen    Lisinopril    Remeron [mirtazapine]    Vancomycin    Zofran [ondansetron]         Medication List  STOP taking these medications    Alogliptin Benzoate 6.25 MG Tabs   carvedilol 25 MG tablet Commonly known as: COREG   losartan 100 MG tablet Commonly known as: COZAAR   torsemide 20 MG tablet Commonly known as: DEMADEX       TAKE these medications    acetaminophen 325 MG tablet Commonly known as: TYLENOL Take 2 tablets by mouth every 6 (six)  hours as needed.   amLODipine 10 MG tablet Commonly known as: NORVASC Take 10 mg by mouth daily.   atorvastatin 40 MG tablet Commonly known as: LIPITOR Take 40 mg by mouth daily as needed.   azithromycin 500 MG tablet Commonly known as: ZITHROMAX Take 1 tablet (500 mg total) by mouth daily for 4 days. Start taking on: January 20, 2021   cycloSPORINE 25 MG capsule Commonly known as: SANDIMMUNE Take 3 capsules by mouth 2 (two) times daily.   DULoxetine 60 MG capsule Commonly known as: CYMBALTA Take 60 mg by mouth daily.   Eliquis 5 MG Tabs tablet Generic drug: apixaban Take 5 mg by mouth 2 (two) times daily.   finasteride 5 MG tablet Commonly known as: PROSCAR Take 5 mg by mouth daily.   fluticasone furoate-vilanterol 200-25 MCG/ACT Aepb Commonly known as: BREO ELLIPTA Inhale 1 puff into the lungs daily. Start taking on: January 20, 100   folic acid 1 MG tablet Commonly known as: FOLVITE Take 1 tablet (1 mg total) by mouth daily. Start taking on: January 20, 2021   labetalol 200 MG tablet Commonly known as: NORMODYNE Take 1 tablet (200 mg total) by mouth 3 (three) times daily.   Lacosamide 150 MG Tabs Take 1 tablet (150 mg total) by mouth 2 (two) times daily. What changed:  medication strength how much to take when to take this   linagliptin 5 MG Tabs tablet Commonly known as: TRADJENTA Take 1 tablet (5 mg total) by mouth daily. Start taking on: January 20, 2021   mycophenolate 180 MG EC tablet Commonly known as: MYFORTIC Take 180 mg by mouth 2 (two) times daily.   polyethylene glycol 17 g packet Commonly known as: MIRALAX / GLYCOLAX Take 17 g by mouth daily. Start taking on: January 20, 2021   sodium bicarbonate 650 MG tablet Take 1 tablet by mouth 3 (three) times daily.   tamsulosin 0.4 MG Caps capsule Commonly known as: FLOMAX Take 0.8 mg by mouth daily.   Vitamin D3 50 MCG (2000 UT) Tabs Take 1 tablet by mouth daily.   Cholecalciferol 25  MCG (1000 UT) tablet Take 2 tablets by mouth daily.        Allergies  Allergen Reactions   Baclofen    Lisinopril    Remeron [Mirtazapine]    Vancomycin    Zofran [Ondansetron]     Consultations: Nephrology  ID   Procedures/Studies: CT HEAD WO CONTRAST (5MM)  Result Date: 01/13/2021 CLINICAL DATA:  Altered mental status, nontraumatic. EXAM: CT HEAD WITHOUT CONTRAST TECHNIQUE: Contiguous axial images were obtained from the base of the skull through the vertex without intravenous contrast. COMPARISON:  CT examination dated January 3 and MRI examination dated January 11, 2021 FINDINGS: Brain: No evidence of acute infarction, hemorrhage, hydrocephalus, extra-axial collection or mass lesion/mass effect. Lacunar infarct of the right thalamus and left basal ganglia are unchanged. Diffuse low-attenuation of the periventricular white matter presumed chronic microvascular ischemic changes. Vascular: No hyperdense vessel or unexpected calcification. Skull: Normal. Negative for fracture or focal lesion. Sinuses/Orbits: No acute finding. Other: None.  IMPRESSION: 1. No acute intracranial abnormality. 2. Stable chronic findings as detailed above. Electronically Signed   By: Keane Police D.O.   On: 01/13/2021 10:51   CT HEAD WO CONTRAST (5MM)  Result Date: 01/10/2021 CLINICAL DATA:  New onset seizure EXAM: CT HEAD WITHOUT CONTRAST TECHNIQUE: Contiguous axial images were obtained from the base of the skull through the vertex without intravenous contrast. COMPARISON:  09/30/2020 FINDINGS: Brain: Normal anatomic configuration. Parenchymal volume loss is commensurate with the patient's age. Stable moderate subcortical and periventricular white matter changes are present likely reflecting the sequela of small vessel ischemia. Remote infarcts are noted within the cerebellar hemispheres bilaterally. Lacunar infarct noted within the left basal ganglia and right thalamus. No abnormal intra or extra-axial mass lesion  or fluid collection. No abnormal mass effect or midline shift. No evidence of acute intracranial hemorrhage or infarct. Ventricular size is normal. Cerebellum unremarkable. Vascular: No asymmetric hyperdense vasculature at the skull base. Moderate atherosclerotic calcification within the carotid siphons. Extensive arteriosclerosis involving the external carotid artery distribution bilaterally. Skull: Intact Sinuses/Orbits: Paranasal sinuses are clear. Orbits are unremarkable. Other: Mastoid air cells and middle ear cavities are clear. IMPRESSION: No acute intracranial hemorrhage or infarct. Stable remote infarcts within the left basal ganglia, right thalamus and cerebellar hemispheres bilaterally. Peripheral vascular disease Electronically Signed   By: Fidela Salisbury M.D.   On: 01/10/2021 20:33   CT CHEST WO CONTRAST  Result Date: 01/13/2021 CLINICAL DATA:  Shortness of breath. EXAM: CT CHEST WITHOUT CONTRAST TECHNIQUE: Multidetector CT imaging of the chest was performed following the standard protocol without IV contrast. COMPARISON:  None. FINDINGS: Cardiovascular: Atherosclerosis of thoracic aorta is noted without aneurysm formation. Mild cardiomegaly is noted. No pericardial effusion is noted. Mitral valve calcifications are noted. Extensive coronary calcifications are noted. Mediastinum/Nodes: No enlarged mediastinal or axillary lymph nodes. Thyroid gland, trachea, and esophagus demonstrate no significant findings. Lungs/Pleura: No pneumothorax is noted. Multiple patchy airspace opacities are noted in both upper lobes concerning for multifocal pneumonia. Small bilateral pleural effusions are noted with adjacent subsegmental atelectasis. Upper Abdomen: No acute abnormality. Musculoskeletal: No chest wall mass or suspicious bone lesions identified. IMPRESSION: Multiple patchy airspace opacities are noted in both upper lobes concerning for multifocal pneumonia. Small bilateral pleural effusions are noted with  adjacent subsegmental atelectasis. Extensive coronary artery calcifications are noted suggesting coronary artery disease. Aortic Atherosclerosis (ICD10-I70.0). Electronically Signed   By: Marijo Conception M.D.   On: 01/13/2021 10:38   MR BRAIN WO CONTRAST  Result Date: 01/15/2021 CLINICAL DATA:  66 year old male with altered mental status. Query posterior reversible encephalopathy syndrome (PRES). EXAM: MRI HEAD WITHOUT CONTRAST TECHNIQUE: Multiplanar, multiecho pulse sequences of the brain and surrounding structures were obtained without intravenous contrast. COMPARISON:  Brain MRI 01/11/2021. Head CT 01/13/2021. FINDINGS: The examination had to be discontinued prior to completion by patient request. Axial and coronal DWI and sagittal T1 weighted imaging only. No midline shift, mass effect, or evidence of intracranial mass lesion. No ventriculomegaly. Stable diffusion. Chronic lacunar infarcts in the deep gray matter nuclei, brainstem. Chronic cerebellar infarcts and confluent facilitated diffusion in the bilateral cerebral white matter. No restricted diffusion or evidence of acute infarction. Normal basilar cisterns. Negative pituitary and cervicomedullary junction. Visualized bone marrow signal is within normal limits. Grossly negative visible cervical spine. IMPRESSION: 1. Discontinued exam by patient request. Only DWI and sagittal T1 weighted imaging obtained today. 2. No evidence of acute infarct.  Advanced chronic ischemia. Electronically Signed   By: Genevie Ann  M.D.   On: 01/15/2021 08:08   MR BRAIN WO CONTRAST  Result Date: 01/11/2021 CLINICAL DATA:  Mental status change, unknown cause EXAM: MRI HEAD WITHOUT CONTRAST TECHNIQUE: Multiplanar, multiecho pulse sequences of the brain and surrounding structures were obtained without intravenous contrast. COMPARISON:  None. FINDINGS: Motion artifact is present. Brain: There is no acute infarction or intracranial hemorrhage. There is no intracranial mass, mass  effect, or edema. There is no hydrocephalus or extra-axial fluid collection. There are chronic infarcts of the basal ganglia, thalamus, pons, and cerebellum. Some demonstrate corresponding chronic blood products. Additional patchy and confluent T2 hyperintensity in the supratentorial and pontine white matter is nonspecific but probably reflects moderate to marked chronic microvascular ischemic changes. Prominence of the ventricles and sulci reflects parenchymal volume loss. Vascular: Major vessel flow voids at the skull base are preserved. Skull and upper cervical spine: Normal marrow signal is preserved. Sinuses/Orbits: Paranasal sinuses are aerated. Orbits are unremarkable. Other: Sella is unremarkable.  Mastoid air cells are clear. IMPRESSION: Motion degraded study. No acute infarct, hemorrhage, or mass. Moderate to marked chronic microvascular ischemic changes. Multiple chronic small vessel infarcts, some of which demonstrate chronic blood products. Electronically Signed   By: Macy Mis M.D.   On: 01/11/2021 12:18   US RENAL  Result Date: 01/16/2021 CLINICAL DATA:  Acute renal insufficiency. EXAM: RENAL / URINARY TRACT ULTRASOUND COMPLETE COMPARISON:  None. FINDINGS: Right Kidney: Renal measurements: 7.4 x 3.1 x 3.8 cm = volume: 46 mL. Moderate parenchyma atrophy and increased echogenicity. No hydronephrosis or shadowing stone. Subcentimeter lower pole cyst. Left Kidney: The left kidney is poorly visualized. There is a right lower quadrant renal transplant measuring 10.9 x 6.7 x 5.5 cm. No hydronephrosis or peritransplant collection. The renal transplant demonstrates a normal echogenicity. Bladder: The urinary bladder is decompressed around a Foley catheter. Diffusely thickened bladder wall may be partly related to underdistention. Correlation with urinalysis recommended to exclude cystitis. Other: There is a small ascites and a small right pleural effusion. IMPRESSION: 1. Atrophic and echogenic right  kidney in keeping with chronic kidney disease. 2. Nonvisualization of the left kidney. 3. Unremarkable right lower quadrant renal transplant. 4. Small right pleural effusion and small ascites. Electronically Signed   By: Anner Crete M.D.   On: 01/16/2021 20:00   DG Chest Portable 1 View  Result Date: 01/10/2021 CLINICAL DATA:  Weakness and altered mental status. EXAM: PORTABLE CHEST 1 VIEW COMPARISON:  None. FINDINGS: Low lung volumes are seen. Mild to moderate severity areas of bibasilar atelectasis and/or infiltrate. The cardiac silhouette is mildly enlarged. The visualized skeletal structures are unremarkable. IMPRESSION: Cardiomegaly with mild to moderate severity bibasilar atelectasis and/or infiltrate. Electronically Signed   By: Virgina Norfolk M.D.   On: 01/10/2021 19:55   EEG adult  Result Date: 01/11/2021 Greta Doom, MD     01/11/2021  6:37 PM History: 66 yo M with a history of stroke presenting with seizure Sedation: None Technique: This EEG was acquired with electrodes placed according to the International 10-20 electrode system (including Fp1, Fp2, F3, F4, C3, C4, P3, P4, O1, O2, T3, T4, T5, T6, A1, A2, Fz, Cz, Pz). The following electrodes were missing or displaced: none. Background: The background consists of intermixed alpha and beta activities. There is a well defined posterior dominant rhythm of 8 Hz that attenuates with eye opening. Sleep is recorded with normal appearing structures. Photic stimulation: Physiologic driving is not performed EEG Abnormalities: none Clinical Interpretation: This normal EEG is recorded in  the waking and sleep state. There was no seizure or seizure predisposition recorded on this study. Please note that lack of epileptiform activity on EEG does not preclude the possibility of epilepsy. Roland Rack, MD [facility] [phone] If 7pm- 7am, please page neurology on call as listed in [place].   ECHOCARDIOGRAM COMPLETE  Result  Date: 01/11/2021    ECHOCARDIOGRAM REPORT   Patient Name:   JAHAD OLD Date of Exam: 01/11/2021 Medical Rec #:  [id number]      Height:       69.0 in Accession #:    [id number]     Weight:       168.0 lb Date of Birth:  1955/03/03     BSA:          1.918 m Patient Age:    83 years       BP:           166/88 mmHg Patient Gender: M              HR:           69 bpm. Exam Location:  [facility] Procedure: 2D Echo, Color Doppler, Cardiac Doppler and Strain Analysis Indications:     Elevated troponin  History:         Patient has no prior history of Echocardiogram examinations.                  Risk Factors:Hypertension, Diabetes and Dyslipidemia.  Sonographer:     Charmayne Sheer Referring Phys:  [id number] AMY N COX Diagnosing Phys: Nelva Bush MD  Sonographer Comments: Suboptimal subcostal window. Global longitudinal strain was attempted. IMPRESSIONS  1. Left ventricular ejection fraction, by estimation, is 60 to 65%. The left ventricle has normal function. The left ventricle has no regional wall motion abnormalities. There is severe left ventricular hypertrophy. Left ventricular diastolic function could not be evaluated. The average left ventricular global longitudinal strain is -11.4 %. The global longitudinal strain is abnormal.  2. Right ventricular systolic function is normal. The right ventricular size is normal. Mildly increased right ventricular wall thickness. There is normal pulmonary artery systolic pressure.  3. Left atrial size was moderately dilated.  4. A small pericardial effusion is present. The pericardial effusion is circumferential.  5. The mitral valve is abnormal. Mild mitral valve regurgitation. Moderate mitral stenosis. The mean mitral valve gradient is 7.0 mmHg. Severe mitral annular calcification.  6. The aortic valve is tricuspid. There is mild thickening of the aortic valve. Aortic valve regurgitation is not visualized. Aortic valve sclerosis is present, with no evidence of aortic valve stenosis.  7.  The inferior vena cava is normal in size with greater than 50% respiratory variability, suggesting right atrial pressure of 3 mmHg. FINDINGS  Left Ventricle: Left ventricular ejection fraction, by estimation, is 60 to 65%. The left ventricle has normal function. The left ventricle has no regional wall motion abnormalities. The average left ventricular global longitudinal strain is -11.4 %. The global longitudinal strain is abnormal. The left ventricular internal cavity size was normal in size. There is severe left ventricular hypertrophy. Left ventricular diastolic function could not be evaluated due to mitral annular calcification (moderate or greater). Left ventricular diastolic function could not be evaluated. Right Ventricle: The right ventricular size is normal. Mildly increased right ventricular wall thickness. Right ventricular systolic function is normal. There is normal pulmonary artery systolic pressure. The tricuspid regurgitant velocity is 2.85 m/s, and with an assumed right atrial pressure of 3 mmHg,  the estimated right ventricular systolic pressure is 75.1 mmHg. Left Atrium: Left atrial size was moderately dilated. Right Atrium: Right atrial size was normal in size. Pericardium: A small pericardial effusion is present. The pericardial effusion is circumferential. Mitral Valve: The mitral valve is abnormal. There is mild thickening of the mitral valve leaflet(s). There is mild calcification of the mitral valve leaflet(s). Moderately decreased mobility of the mitral valve leaflets. Severe mitral annular calcification. Mild mitral valve regurgitation. Moderate mitral valve stenosis. MV peak gradient, 17.3 mmHg. The mean mitral valve gradient is 7.0 mmHg. Tricuspid Valve: The tricuspid valve is normal in structure. Tricuspid valve regurgitation is mild. Aortic Valve: The aortic valve is tricuspid. There is mild thickening of the aortic valve. Aortic valve regurgitation is not visualized. Aortic valve  sclerosis is present, with no evidence of aortic valve stenosis. Aortic valve mean gradient measures 5.0  mmHg. Aortic valve peak gradient measures 11.3 mmHg. Aortic valve area, by VTI measures 2.03 cm. Pulmonic Valve: The pulmonic valve was not well visualized. Pulmonic valve regurgitation is trivial. No evidence of pulmonic stenosis. Aorta: The aortic root and ascending aorta are structurally normal, with no evidence of dilitation. Pulmonary Artery: The pulmonary artery is of normal size. Venous: The inferior vena cava is normal in size with greater than 50% respiratory variability, suggesting right atrial pressure of 3 mmHg. IAS/Shunts: No atrial level shunt detected by color flow Doppler.  LEFT VENTRICLE PLAX 2D LVIDd:         3.85 cm   Diastology LVIDs:         2.52 cm   LV e' medial:    3.81 cm/s LV PW:         1.72 cm   LV E/e' medial:  40.7 LV IVS:        1.75 cm   LV e' lateral:   4.90 cm/s LVOT diam:     1.80 cm   LV E/e' lateral: 31.6 LV SV:         64 LV SV Index:   33        2D Longitudinal Strain LVOT Area:     2.54 cm  2D Strain GLS Avg:     -11.4 %  RIGHT VENTRICLE RV Basal diam:  3.44 cm LEFT ATRIUM           Index        RIGHT ATRIUM           Index LA diam:      4.50 cm 2.35 cm/m   RA Area:     17.70 cm LA Vol (A2C): 50.7 ml 26.43 ml/m  RA Volume:   44.40 ml  23.14 ml/m LA Vol (A4C): 83.0 ml 43.27 ml/m  AORTIC VALVE                     PULMONIC VALVE AV Area (Vmax):    2.03 cm      PV Vmax:          0.94 m/s AV Area (Vmean):   2.07 cm      PV Vmean:         65.800 cm/s AV Area (VTI):     2.03 cm      PV VTI:           0.207 m AV Vmax:           168.00 cm/s   PV Peak grad:     3.5 mmHg AV Vmean:  108.000 cm/s  PV Mean grad:     2.0 mmHg AV VTI:            0.314 m       PR End Diast Vel: 6.15 msec AV Peak Grad:      11.3 mmHg AV Mean Grad:      5.0 mmHg LVOT Vmax:         134.00 cm/s LVOT Vmean:        87.900 cm/s LVOT VTI:          0.250 m LVOT/AV VTI ratio: 0.80  AORTA Ao Root  diam: 3.10 cm MITRAL VALVE                TRICUSPID VALVE MV Area (PHT): 2.48 cm     TR Peak grad:   32.5 mmHg MV Area VTI:   1.36 cm     TR Vmax:        285.00 cm/s MV Peak grad:  17.3 mmHg MV Mean grad:  7.0 mmHg     SHUNTS MV Vmax:       2.08 m/s     Systemic VTI:  0.25 m MV Vmean:      125.0 cm/s   Systemic Diam: 1.80 cm MV Decel Time: 306 msec MV E velocity: 155.00 cm/s MV A velocity: 147.00 cm/s MV E/A ratio:  1.05 Harrell Gave End MD Electronically signed by Nelva Bush MD Signature Date/Time: 01/11/2021/4:10:25 PM    Final      Subjective: He is alert, denies dyspnea.   Discharge Exam: Vitals:   01/19/21 0347 01/19/21 0806  BP: (!) 151/50 (!) 155/67  Pulse: 72 80  Resp: 18 15  Temp: 97.9 F (36.6 C) (!) 97.5 F (36.4 C)  SpO2: 100% 97%     General: Pt is alert, awake, not in acute distress Cardiovascular: RRR, S1/S2 +, no rubs, no gallops Respiratory: CTA bilaterally, no wheezing, no rhonchi Abdominal: Soft, NT, ND, bowel sounds + Extremities: no edema, no cyanosis    The results of significant diagnostics from this hospitalization (including imaging, microbiology, ancillary and laboratory) are listed below for reference.     Microbiology: Recent Results (from the past 240 hour(s))  Resp Panel by RT-PCR (Flu A&B, Covid) Nasopharyngeal Swab     Status: None   Collection Time: 01/11/21  1:11 AM   Specimen: Nasopharyngeal Swab; Nasopharyngeal(NP) swabs in vial transport medium  Result Value Ref Range Status   SARS Coronavirus 2 by RT PCR NEGATIVE NEGATIVE Final    Comment: (NOTE) SARS-CoV-2 target nucleic acids are NOT DETECTED.  The SARS-CoV-2 RNA is generally detectable in upper respiratory specimens during the acute phase of infection. The lowest concentration of SARS-CoV-2 viral copies this assay can detect is 138 copies/mL. A negative result does not preclude SARS-Cov-2 infection and should not be used as the sole basis for treatment or other patient  management decisions. A negative result may occur with  improper specimen collection/handling, submission of specimen other than nasopharyngeal swab, presence of viral mutation(s) within the areas targeted by this assay, and inadequate number of viral copies(<138 copies/mL). A negative result must be combined with clinical observations, patient history, and epidemiological information. The expected result is Negative.  Fact Sheet for Patients:  EntrepreneurPulse.com.au  Fact Sheet for Healthcare Providers:  IncredibleEmployment.be  This test is no t yet approved or cleared by the [place] FDA and  has been authorized for detection and/or diagnosis of SARS-CoV-2 by FDA under an Emergency Use Authorization (EUA).  This EUA will remain  in effect (meaning this test can be used) for the duration of the COVID-19 declaration under Section 564(b)(1) of the Act, 21 U.S.C.section 360bbb-3(b)(1), unless the authorization is terminated  or revoked sooner.       Influenza A by PCR NEGATIVE NEGATIVE Final   Influenza B by PCR NEGATIVE NEGATIVE Final    Comment: (NOTE) The Xpert Xpress SARS-CoV-2/FLU/RSV plus assay is intended as an aid in the diagnosis of influenza from Nasopharyngeal swab specimens and should not be used as a sole basis for treatment. Nasal washings and aspirates are unacceptable for Xpert Xpress SARS-CoV-2/FLU/RSV testing.  Fact Sheet for Patients: EntrepreneurPulse.com.au  Fact Sheet for Healthcare Providers: IncredibleEmployment.be  This test is not yet approved or cleared by the [place] FDA and has been authorized for detection and/or diagnosis of SARS-CoV-2 by FDA under an Emergency Use Authorization (EUA). This EUA will remain in effect (meaning this test can be used) for the duration of the COVID-19 declaration under Section 564(b)(1) of the Act, 21 U.S.C. section 360bbb-3(b)(1),  unless the authorization is terminated or revoked.  Performed at [facility], [address] [place], [place] [address]   Urine Culture     Status: Abnormal   Collection Time: 01/13/21  9:48 AM   Specimen: In/Out Cath Urine  Result Value Ref Range Status   Specimen Description   Final    IN/OUT CATH URINE Performed at [facility], [address] [place], [place] [address]    Special Requests   Final    NONE Performed at [facility], [address] [place], [place] [address]    Culture (A)  Final    >=100,000 COLONIES/mL CITROBACTER SPECIES >=100,000 COLONIES/mL KLEBSIELLA PNEUMONIAE    Report Status 01/16/2021 FINAL  Final   Organism ID, Bacteria CITROBACTER SPECIES (A)  Final   Organism ID, Bacteria KLEBSIELLA PNEUMONIAE (A)  Final      Susceptibility   Citrobacter species - MIC*    CEFAZOLIN >=64 RESISTANT Resistant     CEFEPIME <=0.12 SENSITIVE Sensitive     CEFTRIAXONE <=0.25 SENSITIVE Sensitive     CIPROFLOXACIN <=0.25 SENSITIVE Sensitive     GENTAMICIN <=1 SENSITIVE Sensitive     IMIPENEM 0.5 SENSITIVE Sensitive     NITROFURANTOIN <=16 SENSITIVE Sensitive     TRIMETH/SULFA <=20 SENSITIVE Sensitive     PIP/TAZO <=4 SENSITIVE Sensitive     * >=100,000 COLONIES/mL CITROBACTER SPECIES   Klebsiella pneumoniae - MIC*    AMPICILLIN >=32 RESISTANT Resistant     CEFAZOLIN <=4 SENSITIVE Sensitive     CEFEPIME <=0.12 SENSITIVE Sensitive     CEFTRIAXONE <=0.25 SENSITIVE Sensitive     CIPROFLOXACIN <=0.25 SENSITIVE Sensitive     GENTAMICIN <=1 SENSITIVE Sensitive     IMIPENEM 0.5 SENSITIVE Sensitive     NITROFURANTOIN 128 RESISTANT Resistant     TRIMETH/SULFA <=20 SENSITIVE Sensitive     AMPICILLIN/SULBACTAM 8 SENSITIVE Sensitive     PIP/TAZO <=4 SENSITIVE Sensitive     * >=100,000 COLONIES/mL KLEBSIELLA PNEUMONIAE  Respiratory (~20 pathogens) panel by PCR     Status: None   Collection Time: 01/13/21  1:59 PM   Specimen: Nasopharyngeal  Swab; Respiratory  Result Value Ref Range Status   Adenovirus NOT DETECTED NOT DETECTED Final   Coronavirus 229E NOT DETECTED NOT DETECTED Final    Comment: (NOTE) The Coronavirus on the Respiratory Panel, DOES NOT test for the novel  Coronavirus (2019 nCoV)    Coronavirus HKU1 NOT  DETECTED NOT DETECTED Final   Coronavirus NL63 NOT DETECTED NOT DETECTED Final   Coronavirus OC43 NOT DETECTED NOT DETECTED Final   Metapneumovirus NOT DETECTED NOT DETECTED Final   Rhinovirus / Enterovirus NOT DETECTED NOT DETECTED Final   Influenza A NOT DETECTED NOT DETECTED Final   Influenza B NOT DETECTED NOT DETECTED Final   Parainfluenza Virus 1 NOT DETECTED NOT DETECTED Final   Parainfluenza Virus 2 NOT DETECTED NOT DETECTED Final   Parainfluenza Virus 3 NOT DETECTED NOT DETECTED Final   Parainfluenza Virus 4 NOT DETECTED NOT DETECTED Final   Respiratory Syncytial Virus NOT DETECTED NOT DETECTED Final   Bordetella pertussis NOT DETECTED NOT DETECTED Final   Bordetella Parapertussis NOT DETECTED NOT DETECTED Final   Chlamydophila pneumoniae NOT DETECTED NOT DETECTED Final   Mycoplasma pneumoniae NOT DETECTED NOT DETECTED Final    Comment: Performed at [facility], [place] [address] [place], [place] [address]  Culture, blood (Routine X 2) w Reflex to ID Panel     Status: None   Collection Time: 01/14/21  7:05 AM   Specimen: BLOOD RIGHT HAND  Result Value Ref Range Status   Specimen Description BLOOD RIGHT HAND  Final   Special Requests   Final    BOTTLES DRAWN AEROBIC AND ANAEROBIC Blood Culture adequate volume   Culture   Final    NO GROWTH 5 DAYS Performed at [facility], [address], [place], [place] [address]    Report Status 01/19/2021 FINAL  Final  Culture, blood (Routine X 2) w Reflex to ID Panel     Status: None   Collection Time: 01/14/21  7:11 AM   Specimen: BLOOD RIGHT HAND  Result Value Ref Range Status   Specimen Description BLOOD RIGHT HAND  Final   Special  Requests   Final    BOTTLES DRAWN AEROBIC ONLY Blood Culture adequate volume   Culture   Final    NO GROWTH 5 DAYS Performed at [facility], [address], [place], [place] [address]    Report Status 01/19/2021 FINAL  Final  MRSA Next Gen by PCR, Nasal     Status: None   Collection Time: 01/14/21 11:53 AM   Specimen: Nasal Mucosa; Nasal Swab  Result Value Ref Range Status   MRSA by PCR Next Gen NOT DETECTED NOT DETECTED Final    Comment: (NOTE) The GeneXpert MRSA Assay (FDA approved for NASAL specimens only), is one component of a comprehensive MRSA colonization surveillance program. It is not intended to diagnose MRSA infection nor to guide or monitor treatment for MRSA infections. Test performance is not FDA approved in patients less than 6 years old. Performed at [facility], [address] [place], [place] [address]      Labs: BNP (last 3 results) Recent Labs    09/30/20 1539 01/11/21 0157  BNP 868.7* 3,149.7*   Basic Metabolic Panel: Recent Labs  Lab 01/14/21 0706 01/15/21 0303 01/16/21 0442 01/17/21 0618 01/18/21 0503 01/19/21 0511  NA 135 139 141 139 141 140  K 3.9 3.6 3.6 3.8 4.0 4.2  CL 106 106 110 108 114* 112*  CO2 18* 19* 22 21* 20* 20*  GLUCOSE 164* 237* 189* 163* 202* 174*  BUN 81* 85* 86* 81* 78* 72*  CREATININE 3.12* 3.23* 3.25* 3.08* 2.78* 2.65*  CALCIUM 8.5* 8.3* 8.4* 8.0* 8.2* 8.2*  MG 1.8 1.8 1.8 1.6* 2.0  --   PHOS 6.1* 5.6* 5.0* 4.3 3.4  --  Liver Function Tests: No results for input(s): AST, ALT, ALKPHOS, BILITOT, PROT, ALBUMIN in the last 168 hours. No results for input(s): LIPASE, AMYLASE in the last 168 hours. No results for input(s): AMMONIA in the last 168 hours. CBC: Recent Labs  Lab 01/15/21 0303 01/16/21 0442 01/17/21 0618 01/18/21 0503 01/19/21 0511  WBC 4.9 7.0 7.5 6.9 8.3  HGB 9.2* 9.0* 8.8* 9.1* 8.7*  HCT 27.7* 28.1* 27.2* 28.8* 27.6*  MCV 76.3* 77.8* 76.8* 77.6* 78.6*  PLT 214 201 180 176  188   Cardiac Enzymes: No results for input(s): CKTOTAL, CKMB, CKMBINDEX, TROPONINI in the last 168 hours. BNP: Invalid input(s): POCBNP CBG: Recent Labs  Lab 01/18/21 0749 01/18/21 1134 01/18/21 1659 01/18/21 2008 01/19/21 0801  GLUCAP 166* 169* 258* 246* 145*   D-Dimer No results for input(s): DDIMER in the last 72 hours. Hgb A1c No results for input(s): HGBA1C in the last 72 hours. Lipid Profile No results for input(s): CHOL, HDL, LDLCALC, TRIG, CHOLHDL, LDLDIRECT in the last 72 hours. Thyroid function studies No results for input(s): TSH, T4TOTAL, T3FREE, THYROIDAB in the last 72 hours.  Invalid input(s): FREET3 Anemia work up No results for input(s): VITAMINB12, FOLATE, FERRITIN, TIBC, IRON, RETICCTPCT in the last 72 hours. Urinalysis    Component Value Date/Time   COLORURINE YELLOW (A) 01/13/2021 0948   APPEARANCEUR TURBID (A) 01/13/2021 0948   LABSPEC 1.016 01/13/2021 0948   PHURINE 5.0 01/13/2021 0948   GLUCOSEU NEGATIVE 01/13/2021 0948   HGBUR MODERATE (A) 01/13/2021 0948   BILIRUBINUR NEGATIVE 01/13/2021 0948   KETONESUR NEGATIVE 01/13/2021 0948   PROTEINUR >=300 (A) 01/13/2021 0948   NITRITE NEGATIVE 01/13/2021 0948   LEUKOCYTESUR LARGE (A) 01/13/2021 0948   Sepsis Labs Invalid input(s): PROCALCITONIN,  WBC,  LACTICIDVEN Microbiology Recent Results (from the past 240 hour(s))  Resp Panel by RT-PCR (Flu A&B, Covid) Nasopharyngeal Swab     Status: None   Collection Time: 01/11/21  1:11 AM   Specimen: Nasopharyngeal Swab; Nasopharyngeal(NP) swabs in vial transport medium  Result Value Ref Range Status   SARS Coronavirus 2 by RT PCR NEGATIVE NEGATIVE Final    Comment: (NOTE) SARS-CoV-2 target nucleic acids are NOT DETECTED.  The SARS-CoV-2 RNA is generally detectable in upper respiratory specimens during the acute phase of infection. The lowest concentration of SARS-CoV-2 viral copies this assay can detect is 138 copies/mL. A negative result does not  preclude SARS-Cov-2 infection and should not be used as the sole basis for treatment or other patient management decisions. A negative result may occur with  improper specimen collection/handling, submission of specimen other than nasopharyngeal swab, presence of viral mutation(s) within the areas targeted by this assay, and inadequate number of viral copies(<138 copies/mL). A negative result must be combined with clinical observations, patient history, and epidemiological information. The expected result is Negative.  Fact Sheet for Patients:  EntrepreneurPulse.com.au  Fact Sheet for Healthcare Providers:  IncredibleEmployment.be  This test is no t yet approved or cleared by the [place] FDA and  has been authorized for detection and/or diagnosis of SARS-CoV-2 by FDA under an Emergency Use Authorization (EUA). This EUA will remain  in effect (meaning this test can be used) for the duration of the COVID-19 declaration under Section 564(b)(1) of the Act, 21 U.S.C.section 360bbb-3(b)(1), unless the authorization is terminated  or revoked sooner.       Influenza A by PCR NEGATIVE NEGATIVE Final   Influenza B by PCR NEGATIVE NEGATIVE Final    Comment: (NOTE) The Xpert  Xpress SARS-CoV-2/FLU/RSV plus assay is intended as an aid in the diagnosis of influenza from Nasopharyngeal swab specimens and should not be used as a sole basis for treatment. Nasal washings and aspirates are unacceptable for Xpert Xpress SARS-CoV-2/FLU/RSV testing.  Fact Sheet for Patients: EntrepreneurPulse.com.au  Fact Sheet for Healthcare Providers: IncredibleEmployment.be  This test is not yet approved or cleared by the [place] FDA and has been authorized for detection and/or diagnosis of SARS-CoV-2 by FDA under an Emergency Use Authorization (EUA). This EUA will remain in effect (meaning this test can be used) for the  duration of the COVID-19 declaration under Section 564(b)(1) of the Act, 21 U.S.C. section 360bbb-3(b)(1), unless the authorization is terminated or revoked.  Performed at [facility], [address] [place], [place] [address]   Urine Culture     Status: Abnormal   Collection Time: 01/13/21  9:48 AM   Specimen: In/Out Cath Urine  Result Value Ref Range Status   Specimen Description   Final    IN/OUT CATH URINE Performed at [facility], [address] [place], [place] [address]    Special Requests   Final    NONE Performed at [facility], [address] [place], [place] [address]    Culture (A)  Final    >=100,000 COLONIES/mL CITROBACTER SPECIES >=100,000 COLONIES/mL KLEBSIELLA PNEUMONIAE    Report Status 01/16/2021 FINAL  Final   Organism ID, Bacteria CITROBACTER SPECIES (A)  Final   Organism ID, Bacteria KLEBSIELLA PNEUMONIAE (A)  Final      Susceptibility   Citrobacter species - MIC*    CEFAZOLIN >=64 RESISTANT Resistant     CEFEPIME <=0.12 SENSITIVE Sensitive     CEFTRIAXONE <=0.25 SENSITIVE Sensitive     CIPROFLOXACIN <=0.25 SENSITIVE Sensitive     GENTAMICIN <=1 SENSITIVE Sensitive     IMIPENEM 0.5 SENSITIVE Sensitive     NITROFURANTOIN <=16 SENSITIVE Sensitive     TRIMETH/SULFA <=20 SENSITIVE Sensitive     PIP/TAZO <=4 SENSITIVE Sensitive     * >=100,000 COLONIES/mL CITROBACTER SPECIES   Klebsiella pneumoniae - MIC*    AMPICILLIN >=32 RESISTANT Resistant     CEFAZOLIN <=4 SENSITIVE Sensitive     CEFEPIME <=0.12 SENSITIVE Sensitive     CEFTRIAXONE <=0.25 SENSITIVE Sensitive     CIPROFLOXACIN <=0.25 SENSITIVE Sensitive     GENTAMICIN <=1 SENSITIVE Sensitive     IMIPENEM 0.5 SENSITIVE Sensitive     NITROFURANTOIN 128 RESISTANT Resistant     TRIMETH/SULFA <=20 SENSITIVE Sensitive     AMPICILLIN/SULBACTAM 8 SENSITIVE Sensitive     PIP/TAZO <=4 SENSITIVE Sensitive     * >=100,000 COLONIES/mL KLEBSIELLA PNEUMONIAE  Respiratory  (~20 pathogens) panel by PCR     Status: None   Collection Time: 01/13/21  1:59 PM   Specimen: Nasopharyngeal Swab; Respiratory  Result Value Ref Range Status   Adenovirus NOT DETECTED NOT DETECTED Final   Coronavirus 229E NOT DETECTED NOT DETECTED Final    Comment: (NOTE) The Coronavirus on the Respiratory Panel, DOES NOT test for the novel  Coronavirus (2019 nCoV)    Coronavirus HKU1 NOT DETECTED NOT DETECTED Final   Coronavirus NL63 NOT DETECTED NOT DETECTED Final   Coronavirus OC43 NOT DETECTED NOT DETECTED Final   Metapneumovirus NOT DETECTED NOT DETECTED Final   Rhinovirus / Enterovirus NOT DETECTED NOT DETECTED Final   Influenza A NOT DETECTED NOT DETECTED Final   Influenza B NOT DETECTED NOT DETECTED Final   Parainfluenza Virus 1 NOT DETECTED NOT DETECTED  Final   Parainfluenza Virus 2 NOT DETECTED NOT DETECTED Final   Parainfluenza Virus 3 NOT DETECTED NOT DETECTED Final   Parainfluenza Virus 4 NOT DETECTED NOT DETECTED Final   Respiratory Syncytial Virus NOT DETECTED NOT DETECTED Final   Bordetella pertussis NOT DETECTED NOT DETECTED Final   Bordetella Parapertussis NOT DETECTED NOT DETECTED Final   Chlamydophila pneumoniae NOT DETECTED NOT DETECTED Final   Mycoplasma pneumoniae NOT DETECTED NOT DETECTED Final    Comment: Performed at [facility], [place] [address] [place], [place] [address]  Culture, blood (Routine X 2) w Reflex to ID Panel     Status: None   Collection Time: 01/14/21  7:05 AM   Specimen: BLOOD RIGHT HAND  Result Value Ref Range Status   Specimen Description BLOOD RIGHT HAND  Final   Special Requests   Final    BOTTLES DRAWN AEROBIC AND ANAEROBIC Blood Culture adequate volume   Culture   Final    NO GROWTH 5 DAYS Performed at [facility], [address], [place], [place] [address]    Report Status 01/19/2021 FINAL  Final  Culture, blood (Routine X 2) w Reflex to ID Panel     Status: None   Collection Time: 01/14/21  7:11 AM    Specimen: BLOOD RIGHT HAND  Result Value Ref Range Status   Specimen Description BLOOD RIGHT HAND  Final   Special Requests   Final    BOTTLES DRAWN AEROBIC ONLY Blood Culture adequate volume   Culture   Final    NO GROWTH 5 DAYS Performed at [facility], [address] [place], [place] [address]    Report Status 01/19/2021 FINAL  Final  MRSA Next Gen by PCR, Nasal     Status: None   Collection Time: 01/14/21 11:53 AM   Specimen: Nasal Mucosa; Nasal Swab  Result Value Ref Range Status   MRSA by PCR Next Gen NOT DETECTED NOT DETECTED Final    Comment: (NOTE) The GeneXpert MRSA Assay (FDA approved for NASAL specimens only), is one component of a comprehensive MRSA colonization surveillance program. It is not intended to diagnose MRSA infection nor to guide or monitor treatment for MRSA infections. Test performance is not FDA approved in patients less than 33 years old. Performed at [facility], [address], [place], [place] [address]      Time coordinating discharge: 40 minutes  SIGNED:   Elmarie Shiley, MD  [facility]

## 2021-01-19 NOTE — Progress Notes (Signed)
[facility]  ROUNDING NOTE   Subjective:   Patient resting in bed Appears in good spirits Denies shortness of breath, room air  Creatinine 2.65 Urine output 1L recorded Foley in place  Objective:  Vital signs in last 24 hours:  Temp:  [97.3 F (36.3 C)-97.9 F (36.6 C)] 97.5 F (36.4 C) (01/12 0806) Pulse Rate:  [70-80] 80 (01/12 0806) Resp:  [15-20] 15 (01/12 0806) BP: (126-162)/(50-97) 155/67 (01/12 0806) SpO2:  [97 %-100 %] 97 % (01/12 0806)  Weight change:  Filed Weights   01/10/21 1926 01/14/21 1152  Weight: 76.2 kg 70.8 kg    Intake/Output: I/O last 3 completed shifts: In: 335 [P.O.:240; IV Piggyback:95] Out: 5366 [[id number]]   Intake/Output this shift:  No intake/output data recorded.  Physical Exam: General: NAD  Head: Normocephalic, atraumatic. Moist oral mucosal membranes  Eyes: Anicteric  Lungs:  Clear to auscultation, normal effort  Heart: Regular rate and rhythm  Abdomen:  Soft, nontender  Extremities:  no peripheral edema.  Neurologic: Nonfocal, moving all four extremities  Skin: No lesions  GU Foley    Basic Metabolic Panel: Recent Labs  Lab 01/14/21 0706 01/15/21 0303 01/16/21 0442 01/17/21 0618 01/18/21 0503 01/19/21 0511  NA 135 139 141 139 141 140  K 3.9 3.6 3.6 3.8 4.0 4.2  CL 106 106 110 108 114* 112*  CO2 18* 19* 22 21* 20* 20*  GLUCOSE 164* 237* 189* 163* 202* 174*  BUN 81* 85* 86* 81* 78* 72*  CREATININE 3.12* 3.23* 3.25* 3.08* 2.78* 2.65*  CALCIUM 8.5* 8.3* 8.4* 8.0* 8.2* 8.2*  MG 1.8 1.8 1.8 1.6* 2.0  --   PHOS 6.1* 5.6* 5.0* 4.3 3.4  --      Liver Function Tests: No results for input(s): AST, ALT, ALKPHOS, BILITOT, PROT, ALBUMIN in the last 168 hours.  No results for input(s): LIPASE, AMYLASE in the last 168 hours. No results for input(s): AMMONIA in the last 168 hours.   CBC: Recent Labs  Lab 01/15/21 0303 01/16/21 0442 01/17/21 0618 01/18/21 0503 01/19/21 0511  WBC 4.9 7.0 7.5 6.9 8.3   HGB 9.2* 9.0* 8.8* 9.1* 8.7*  HCT 27.7* 28.1* 27.2* 28.8* 27.6*  MCV 76.3* 77.8* 76.8* 77.6* 78.6*  PLT 214 201 180 176 188     Cardiac Enzymes: No results for input(s): CKTOTAL, CKMB, CKMBINDEX, TROPONINI in the last 168 hours.  BNP: Invalid input(s): POCBNP  CBG: Recent Labs  Lab 01/18/21 0749 01/18/21 1134 01/18/21 1659 01/18/21 2008 01/19/21 0801  GLUCAP 166* 169* 258* 246* 145*     Microbiology: Results for orders placed or performed during the hospital encounter of 01/10/21  Resp Panel by RT-PCR (Flu A&B, Covid) Nasopharyngeal Swab     Status: None   Collection Time: 01/11/21  1:11 AM   Specimen: Nasopharyngeal Swab; Nasopharyngeal(NP) swabs in vial transport medium  Result Value Ref Range Status   SARS Coronavirus 2 by RT PCR NEGATIVE NEGATIVE Final    Comment: (NOTE) SARS-CoV-2 target nucleic acids are NOT DETECTED.  The SARS-CoV-2 RNA is generally detectable in upper respiratory specimens during the acute phase of infection. The lowest concentration of SARS-CoV-2 viral copies this assay can detect is 138 copies/mL. A negative result does not preclude SARS-Cov-2 infection and should not be used as the sole basis for treatment or other patient management decisions. A negative result may occur with  improper specimen collection/handling, submission of specimen other than nasopharyngeal swab, presence of viral mutation(s) within the areas targeted by this  assay, and inadequate number of viral copies(<138 copies/mL). A negative result must be combined with clinical observations, patient history, and epidemiological information. The expected result is Negative.  Fact Sheet for Patients:  EntrepreneurPulse.com.au  Fact Sheet for Healthcare Providers:  IncredibleEmployment.be  This test is no t yet approved or cleared by the [place] FDA and  has been authorized for detection and/or diagnosis of SARS-CoV-2 by FDA under  an Emergency Use Authorization (EUA). This EUA will remain  in effect (meaning this test can be used) for the duration of the COVID-19 declaration under Section 564(b)(1) of the Act, 21 U.S.C.section 360bbb-3(b)(1), unless the authorization is terminated  or revoked sooner.       Influenza A by PCR NEGATIVE NEGATIVE Final   Influenza B by PCR NEGATIVE NEGATIVE Final    Comment: (NOTE) The Xpert Xpress SARS-CoV-2/FLU/RSV plus assay is intended as an aid in the diagnosis of influenza from Nasopharyngeal swab specimens and should not be used as a sole basis for treatment. Nasal washings and aspirates are unacceptable for Xpert Xpress SARS-CoV-2/FLU/RSV testing.  Fact Sheet for Patients: EntrepreneurPulse.com.au  Fact Sheet for Healthcare Providers: IncredibleEmployment.be  This test is not yet approved or cleared by the [place] FDA and has been authorized for detection and/or diagnosis of SARS-CoV-2 by FDA under an Emergency Use Authorization (EUA). This EUA will remain in effect (meaning this test can be used) for the duration of the COVID-19 declaration under Section 564(b)(1) of the Act, 21 U.S.C. section 360bbb-3(b)(1), unless the authorization is terminated or revoked.  Performed at [facility], [address] [place], [place] [address]   Urine Culture     Status: Abnormal   Collection Time: 01/13/21  9:48 AM   Specimen: In/Out Cath Urine  Result Value Ref Range Status   Specimen Description   Final    IN/OUT CATH URINE Performed at [facility], [address] [place], [place] [address]    Special Requests   Final    NONE Performed at [facility], [address] [place], [place] [address]    Culture (A)  Final    >=100,000 COLONIES/mL CITROBACTER SPECIES >=100,000 COLONIES/mL KLEBSIELLA PNEUMONIAE    Report Status 01/16/2021 FINAL  Final   Organism ID, Bacteria CITROBACTER SPECIES (A)   Final   Organism ID, Bacteria KLEBSIELLA PNEUMONIAE (A)  Final      Susceptibility   Citrobacter species - MIC*    CEFAZOLIN >=64 RESISTANT Resistant     CEFEPIME <=0.12 SENSITIVE Sensitive     CEFTRIAXONE <=0.25 SENSITIVE Sensitive     CIPROFLOXACIN <=0.25 SENSITIVE Sensitive     GENTAMICIN <=1 SENSITIVE Sensitive     IMIPENEM 0.5 SENSITIVE Sensitive     NITROFURANTOIN <=16 SENSITIVE Sensitive     TRIMETH/SULFA <=20 SENSITIVE Sensitive     PIP/TAZO <=4 SENSITIVE Sensitive     * >=100,000 COLONIES/mL CITROBACTER SPECIES   Klebsiella pneumoniae - MIC*    AMPICILLIN >=32 RESISTANT Resistant     CEFAZOLIN <=4 SENSITIVE Sensitive     CEFEPIME <=0.12 SENSITIVE Sensitive     CEFTRIAXONE <=0.25 SENSITIVE Sensitive     CIPROFLOXACIN <=0.25 SENSITIVE Sensitive     GENTAMICIN <=1 SENSITIVE Sensitive     IMIPENEM 0.5 SENSITIVE Sensitive     NITROFURANTOIN 128 RESISTANT Resistant     TRIMETH/SULFA <=20 SENSITIVE Sensitive     AMPICILLIN/SULBACTAM 8 SENSITIVE Sensitive     PIP/TAZO <=4 SENSITIVE Sensitive     * >=100,000 COLONIES/mL KLEBSIELLA PNEUMONIAE  Respiratory (~20 pathogens) panel by PCR     Status: None   Collection Time: 01/13/21  1:59 PM   Specimen: Nasopharyngeal Swab; Respiratory  Result Value Ref Range Status   Adenovirus NOT DETECTED NOT DETECTED Final   Coronavirus 229E NOT DETECTED NOT DETECTED Final    Comment: (NOTE) The Coronavirus on the Respiratory Panel, DOES NOT test for the novel  Coronavirus (2019 nCoV)    Coronavirus HKU1 NOT DETECTED NOT DETECTED Final   Coronavirus NL63 NOT DETECTED NOT DETECTED Final   Coronavirus OC43 NOT DETECTED NOT DETECTED Final   Metapneumovirus NOT DETECTED NOT DETECTED Final   Rhinovirus / Enterovirus NOT DETECTED NOT DETECTED Final   Influenza A NOT DETECTED NOT DETECTED Final   Influenza B NOT DETECTED NOT DETECTED Final   Parainfluenza Virus 1 NOT DETECTED NOT DETECTED Final   Parainfluenza Virus 2 NOT DETECTED NOT DETECTED  Final   Parainfluenza Virus 3 NOT DETECTED NOT DETECTED Final   Parainfluenza Virus 4 NOT DETECTED NOT DETECTED Final   Respiratory Syncytial Virus NOT DETECTED NOT DETECTED Final   Bordetella pertussis NOT DETECTED NOT DETECTED Final   Bordetella Parapertussis NOT DETECTED NOT DETECTED Final   Chlamydophila pneumoniae NOT DETECTED NOT DETECTED Final   Mycoplasma pneumoniae NOT DETECTED NOT DETECTED Final    Comment: Performed at [facility], [place], [place], [place] [address]  Culture, blood (Routine X 2) w Reflex to ID Panel     Status: None   Collection Time: 01/14/21  7:05 AM   Specimen: BLOOD RIGHT HAND  Result Value Ref Range Status   Specimen Description BLOOD RIGHT HAND  Final   Special Requests   Final    BOTTLES DRAWN AEROBIC AND ANAEROBIC Blood Culture adequate volume   Culture   Final    NO GROWTH 5 DAYS Performed at [facility], [address], [place], [place] [address]    Report Status 01/19/2021 FINAL  Final  Culture, blood (Routine X 2) w Reflex to ID Panel     Status: None   Collection Time: 01/14/21  7:11 AM   Specimen: BLOOD RIGHT HAND  Result Value Ref Range Status   Specimen Description BLOOD RIGHT HAND  Final   Special Requests   Final    BOTTLES DRAWN AEROBIC ONLY Blood Culture adequate volume   Culture   Final    NO GROWTH 5 DAYS Performed at [facility], [address], [place], [place] [address]    Report Status 01/19/2021 FINAL  Final  MRSA Next Gen by PCR, Nasal     Status: None   Collection Time: 01/14/21 11:53 AM   Specimen: Nasal Mucosa; Nasal Swab  Result Value Ref Range Status   MRSA by PCR Next Gen NOT DETECTED NOT DETECTED Final    Comment: (NOTE) The GeneXpert MRSA Assay (FDA approved for NASAL specimens only), is one component of a comprehensive MRSA colonization surveillance program. It is not intended to diagnose MRSA infection nor to guide or monitor treatment for MRSA infections. Test performance  is not FDA approved in patients less than 65 years old. Performed at [facility], [address] [place], [place] [address]     Coagulation Studies: No results for input(s): LABPROT, INR in the last 72 hours.  Urinalysis: No results for input(s): COLORURINE, LABSPEC, PHURINE, GLUCOSEU, HGBUR, BILIRUBINUR, KETONESUR, PROTEINUR, UROBILINOGEN, NITRITE, LEUKOCYTESUR in the last 72 hours.  Invalid input(s): APPERANCEUR    Imaging: No results found.  Medications:    sodium chloride 75 mL/hr at 01/18/21 0424   cefTRIAXone (ROCEPHIN)  IV 2 g (01/18/21 1241)    amLODipine  10 mg Oral Daily   apixaban  5 mg Oral BID   atorvastatin  40 mg Oral QHS   azithromycin  500 mg Oral Daily   Chlorhexidine Gluconate Cloth  6 each Topical Q0600   cholecalciferol  2,000 Units Oral Daily   cycloSPORINE  75 mg Oral BID   DULoxetine  60 mg Oral Daily   finasteride  5 mg Oral Daily   fluticasone furoate-vilanterol  1 puff Inhalation Daily   folic acid  1 mg Oral Daily   insulin aspart  0-5 Units Subcutaneous QHS   insulin aspart  0-9 Units Subcutaneous TID WC   labetalol  200 mg Oral TID   lacosamide  150 mg Oral BID   linagliptin  5 mg Oral Daily   mouth rinse  15 mL Mouth Rinse BID   mycophenolate  180 mg Oral BID   polyethylene glycol  17 g Oral Daily   sodium bicarbonate  650 mg Oral TID   tamsulosin  0.8 mg Oral QPC supper   bisacodyl, bisacodyl, diltiazem, haloperidol lactate, hydrOXYzine, ipratropium-albuterol, LORazepam, ondansetron **OR** ondansetron (ZOFRAN) IV  Assessment/ Plan:  Mr. Jared Gregory is a 66 y.o.  male with past medical history consisting of diabetes type 2, hypertension, hep C, hyperlipidemia, anemia, multiple myeloma, and renal transplant in 2017, who was admitted to [facility] on 01/10/2021 for Seizure [facility]) [R56.9] Seizure-like activity ([place]) [R56.9] Syncope, unspecified syncope type [R55]   Acute Kidney Injury on chronic kidney disease stage IV, status  post renal  transplant in April 2017.Baseline creatinine 3.1  on 12/28/20. [facility] could be considered multifactorial from UTI, pneumonia, and suspected seizure activity Losartan and diuretic currently held.  No IV contrast exposure.  No indication for dialysis at this time.   - Creatinine stable with adequate urine output - Continue current treatment with cyclosporine and mycophenolate  Lab Results  Component Value Date   CREATININE 2.65 (H) 01/19/2021   CREATININE 2.78 (H) 01/18/2021   CREATININE 3.08 (H) 01/17/2021    Intake/Output Summary (Last 24 hours) at 01/19/2021 1002 Last data filed at 01/19/2021 0429 Gross per 24 hour  Intake 240 ml  Output 1050 ml  Net -810 ml    2. Anemia of chronic kidney disease     Component Value Date/Time   HGB 8.7 (L) 01/19/2021 0511   HCT 27.6 (L) 01/19/2021 0511    Hgb below target but stable   LOS: 8   1/12/202310:02 AM

## 2021-03-14 ENCOUNTER — Other Ambulatory Visit: Payer: Self-pay

## 2021-03-14 ENCOUNTER — Emergency Department: Payer: No Typology Code available for payment source

## 2021-03-14 ENCOUNTER — Emergency Department
Admission: EM | Admit: 2021-03-14 | Discharge: 2021-03-14 | Disposition: A | Discharge status: Home / Self Care | Payer: No Typology Code available for payment source | Attending: Emergency Medicine | Admitting: Emergency Medicine

## 2021-03-14 DIAGNOSIS — N1330 Unspecified hydronephrosis: Secondary | ICD-10-CM | POA: Diagnosis not present

## 2021-03-14 DIAGNOSIS — D649 Anemia, unspecified: Secondary | ICD-10-CM | POA: Insufficient documentation

## 2021-03-14 DIAGNOSIS — Z7901 Long term (current) use of anticoagulants: Secondary | ICD-10-CM | POA: Diagnosis not present

## 2021-03-14 DIAGNOSIS — N3000 Acute cystitis without hematuria: Secondary | ICD-10-CM | POA: Insufficient documentation

## 2021-03-14 DIAGNOSIS — N179 Acute kidney failure, unspecified: Secondary | ICD-10-CM | POA: Diagnosis not present

## 2021-03-14 DIAGNOSIS — N186 End stage renal disease: Secondary | ICD-10-CM | POA: Insufficient documentation

## 2021-03-14 DIAGNOSIS — I4891 Unspecified atrial fibrillation: Secondary | ICD-10-CM | POA: Diagnosis not present

## 2021-03-14 DIAGNOSIS — R339 Retention of urine, unspecified: Secondary | ICD-10-CM

## 2021-03-14 LAB — RETICULOCYTES
Immature Retic Fract: 8.1 % (ref 2.3–15.9)
RBC.: 3.27 MIL/uL — ABNORMAL LOW (ref 4.22–5.81)
Retic Count, Absolute: 49.1 10*3/uL (ref 19.0–186.0)
Retic Ct Pct: 1.5 % (ref 0.4–3.1)

## 2021-03-14 LAB — BASIC METABOLIC PANEL
Anion gap: 15 (ref 5–15)
BUN: 70 mg/dL — ABNORMAL HIGH (ref 8–23)
CO2: 23 mmol/L (ref 22–32)
Calcium: 9 mg/dL (ref 8.9–10.3)
Chloride: 102 mmol/L (ref 98–111)
Creatinine, Ser: 3.76 mg/dL — ABNORMAL HIGH (ref 0.61–1.24)
GFR, Estimated: 17 mL/min — ABNORMAL LOW (ref >60)
Glucose, Bld: 187 mg/dL — ABNORMAL HIGH (ref 70–99)
Potassium: 4.6 mmol/L (ref 3.5–5.1)
Sodium: 140 mmol/L (ref 135–145)

## 2021-03-14 LAB — CBC WITH DIFFERENTIAL/PLATELET
Abs Immature Granulocytes: 0.02 10*3/uL (ref 0.00–0.07)
Basophils Absolute: 0 10*3/uL (ref 0.0–0.1)
Basophils Relative: 0 %
Eosinophils Absolute: 0.2 10*3/uL (ref 0.0–0.5)
Eosinophils Relative: 3 %
HCT: 25.9 % — ABNORMAL LOW (ref 39.0–52.0)
Hemoglobin: 7.9 g/dL — ABNORMAL LOW (ref 13.0–17.0)
Immature Granulocytes: 0 %
Lymphocytes Relative: 9 %
Lymphs Abs: 0.5 10*3/uL — ABNORMAL LOW (ref 0.7–4.0)
MCH: 24.2 pg — ABNORMAL LOW (ref 26.0–34.0)
MCHC: 30.5 g/dL (ref 30.0–36.0)
MCV: 79.4 fL — ABNORMAL LOW (ref 80.0–100.0)
Monocytes Absolute: 0.3 10*3/uL (ref 0.1–1.0)
Monocytes Relative: 6 %
Neutro Abs: 4.4 10*3/uL (ref 1.7–7.7)
Neutrophils Relative %: 82 %
Platelets: 223 10*3/uL (ref 150–400)
RBC: 3.26 MIL/uL — ABNORMAL LOW (ref 4.22–5.81)
RDW: 20.9 % — ABNORMAL HIGH (ref 11.5–15.5)
WBC: 5.4 10*3/uL (ref 4.0–10.5)
nRBC: 0 % (ref 0.0–0.2)

## 2021-03-14 LAB — URINALYSIS, COMPLETE (UACMP) WITH MICROSCOPIC
Bacteria, UA: NONE SEEN
Bilirubin Urine: NEGATIVE
Glucose, UA: NEGATIVE mg/dL
Ketones, ur: NEGATIVE mg/dL
Nitrite: NEGATIVE
Protein, ur: 100 mg/dL — AB
Specific Gravity, Urine: 1.01 (ref 1.005–1.030)
Squamous Epithelial / HPF: NONE SEEN (ref 0–5)
pH: 7 (ref 5.0–8.0)

## 2021-03-14 LAB — IRON AND TIBC
Iron: 70 ug/dL (ref 45–182)
Saturation Ratios: 22 % (ref 17.9–39.5)
TIBC: 315 ug/dL (ref 250–450)
UIBC: 245 ug/dL

## 2021-03-14 LAB — PROTIME-INR
INR: 1.5 — ABNORMAL HIGH (ref 0.8–1.2)
Prothrombin Time: 18 seconds — ABNORMAL HIGH (ref 11.4–15.2)

## 2021-03-14 MED ORDER — CIPROFLOXACIN HCL 500 MG PO TABS: 500.0000 mg | ORAL_TABLET | Freq: Two times a day (BID) | ORAL | 0 refills | Status: AC

## 2021-03-14 MED ORDER — CEFTRIAXONE SODIUM 1 G IJ SOLR
1.0000 g | Freq: Once | INTRAMUSCULAR | Status: AC
  Administered 2021-03-14: 1 g via INTRAVENOUS
  Filled 2021-03-14: qty 10

## 2021-03-14 MED ORDER — CIPROFLOXACIN HCL 500 MG PO TABS: 500.0000 mg | ORAL_TABLET | Freq: Two times a day (BID) | ORAL | 0 refills | Status: DC

## 2021-03-14 MED ORDER — LACTATED RINGERS IV BOLUS
1000.0000 mL | Freq: Once | INTRAVENOUS | Status: AC
  Administered 2021-03-14: 1000 mL via INTRAVENOUS

## 2021-03-14 NOTE — ED Notes (Signed)
Post void residual 256 mL ?

## 2021-03-14 NOTE — ED Notes (Signed)
EMS Called to take patient back to [facility] ?

## 2021-03-14 NOTE — ED Notes (Signed)
[facility], she stated they had no beds at the moment ?

## 2021-03-14 NOTE — ED Provider Notes (Addendum)
? ?[facility] ?Provider Note ? ? ? Event Date/Time  ? First MD Initiated Contact with Patient 03/14/21 1537   ?  (approximate) ? ? ?History  ? ?abnormal labs (Pt sent from [facility] with hct of 7.5. Pt has no complaints at this time. ) ? ? ?HPI ? ?Bryton Romagnoli is a 66 y.o. male past medical history significant for anxiety/depression, HLD, A. fib on Eliquis, CVA, seizures on lacosamide, ESRD status post kidney transplant 5 years ago who presents for evaluation from his nursing facility with concern for low hematocrit per report.  Patient does not recall any bloody or melanotic stools, blood in his urine, any injuries or other bleeding.  He denies any associated symptoms including lightheadedness, dizziness, shortness of breath, chest pain, abdominal pain, back pain, headache, earache, sore throat or any other sick symptoms as far as he is aware.  He feels he has been his usual state health.  States he has never had a blood transfusion before but once it came close due to anemia. ? ?  ? ? ?Physical Exam  ?Triage Vital Signs: ?ED Triage Vitals  ?Enc Vitals Group  ?   BP   ?   Pulse   ?   Resp   ?   Temp   ?   Temp src   ?   SpO2   ?   Weight   ?   Height   ?   Head Circumference   ?   Peak Flow   ?   Pain Score   ?   Pain Loc   ?   Pain Edu?   ?   Excl. in [place]?   ? ? ?Most recent vital signs: ?Vitals:  ? 03/14/21 1745 03/14/21 1800  ?BP:  (!) 175/91  ?Pulse: 63 63  ?Resp: 13 14  ?Temp:    ?SpO2: 100% 100%  ? ? ?General: Awake, no distress.  ?CV:  Good peripheral perfusion.  ?Resp:  Normal effort.  ?Abd:  No distention.  ?Other:   ? ? ?ED Results / Procedures / Treatments  ?Labs ?(all labs ordered are listed, but only abnormal results are displayed) ?Labs Reviewed  ?CBC WITH DIFFERENTIAL/PLATELET - Abnormal; Notable for the following components:  ?    Result Value  ? RBC 3.26 (*)   ? Hemoglobin 7.9 (*)   ? HCT 25.9 (*)   ? MCV 79.4 (*)   ? MCH 24.2 (*)   ? RDW 20.9 (*)   ? Lymphs Abs 0.5 (*)   ?  All other components within normal limits  ?BASIC METABOLIC PANEL - Abnormal; Notable for the following components:  ? Glucose, Bld 187 (*)   ? BUN 70 (*)   ? Creatinine, Ser 3.76 (*)   ? GFR, Estimated 17 (*)   ? All other components within normal limits  ?RETICULOCYTES - Abnormal; Notable for the following components:  ? RBC. 3.27 (*)   ? All other components within normal limits  ?PROTIME-INR - Abnormal; Notable for the following components:  ? Prothrombin Time 18.0 (*)   ? INR 1.5 (*)   ? All other components within normal limits  ?URINALYSIS, COMPLETE (UACMP) WITH MICROSCOPIC - Abnormal; Notable for the following components:  ? Color, Urine STRAW (*)   ? APPearance CLEAR (*)   ? Hgb urine dipstick SMALL (*)   ? Protein, ur 100 (*)   ? Leukocytes,Ua LARGE (*)   ? All other components within normal limits  ?  URINE CULTURE  ?IRON AND TIBC  ?TYPE AND SCREEN  ? ? ? ?EKG ? ? ? ?RADIOLOGY ? ?Renal ultrasound my interpretation shows some mild hydronephrosis without other clear process.  Also viewed radiology interpretation and agree with the findings of some mild hydronephrosis in the lower quadrant transplanted kidney with some echogenic and atrophy changes of the native kidney and focal wall thickening versus soft tissue mass of the posterior bladder. ? ? ?PROCEDURES: ? ?Critical Care performed: No ? ?.1-3 Lead EKG Interpretation ?Performed by: Lucrezia Starch, MD ?Authorized by: Lucrezia Starch, MD  ? ?  Interpretation: normal   ?  ECG rate assessment: normal   ?  Rhythm: sinus rhythm   ?  Ectopy: none   ?  Conduction: normal   ? ?The patient is on the cardiac monitor to evaluate for evidence of arrhythmia and/or significant heart rate changes. ? ? ?MEDICATIONS ORDERED IN ED: ?Medications  ?lactated ringers bolus 1,000 mL (0 mLs Intravenous Stopped 03/14/21 1906)  ? ? ? ?IMPRESSION / MDM / ASSESSMENT AND PLAN / ED COURSE  ?I reviewed the triage vital signs and the nursing notes. ?             ?                ? ?Patient presents for evaluation of reportedly hematocrit of 7.5 on routine labs.  Patient is denying any acute complaints including any recent GI bleeding or melanotic stools.  He is anticoagulant Eliquis and on review of records it seems he also has a history of anemia of chronic disease. ? ?CBC obtained in emergency room shows no leukocytosis and hemoglobin of 7.9 with a hematocrit of 25.9 and normal platelets.  I suspect initial concern was with regards to the hemoglobin and not hematocrit.  This appears close to baseline as it seems over the past month patient has ranged 27.1 and 9.1.  Given he does not have any associated symptoms with this and is no history of recent bleeding do not believe this requires any transfusion at this time.  Retake count percent is 1.5 which is appropriate.  Iron panel shows slightly decreased total iron binding capacity but otherwise shows normal iron levels.  INR is unremarkable.  Of note BMP obtained shows evidence of an AKI on CKD with a creatinine of 3.76 compared to 2.54-monthago.  BUN is also elevated at 70.  Glucose slightly elevated at 187 although patient seems to range in this area with several months.  No other acute electrolyte or metabolic derangements ? ?Renal ultrasound my interpretation shows some mild hydronephrosis without other clear process.  Also viewed radiology interpretation and agree with the findings of some mild hydronephrosis in the lower quadrant transplanted kidney with some echogenic and atrophy changes of the native kidney and focal wall thickening versus soft tissue mass of the posterior bladder. ? ?UA has large leukocyte esterase and 21-50 WBCs concerning for ongoing cystitis possibly contributing to urinary retention.  I reviewed previous urine culture from January and discussed with pharmacist.  They recommend a course of Cipro.  Will write for course of this. ? ?I discussed patient's AKI on CKD with Dr. SCandiss Norse  He agrees with placement of  Foley which was placed in the emergency room.  He also recommended that it be safer to observe patient overnight in the hospital given patient is a transplant patient with underlying CKD already to ensure that his kidney function is improving.  I discussed  this at length with the patient and his stating he wishes to be discharged.  He did request to reach out to the [place] where he gets most of his care and I did although they are unfortunately not excepting transfers at the moment.  He states he understands that his kidney function could get significantly worse in the next 24 hours if there are other causes for his kidney injury and that I recommend he stay for observation.  He states he will follow-up with his nephrologist to have his kidney function rechecked in 2 or 3 days.  He denies any other acute symptoms at this time.  He was discharged against my advice on his acute capacity to make this decision.  I recommend he follow-up with urology at the [facility] and his nephrologist. ? ? ? ?FINAL CLINICAL IMPRESSION(S) / ED DIAGNOSES  ? ?Final diagnoses:  ?Anemia, unspecified type  ?AKI (acute kidney injury) ([place])  ?Urinary retention  ?Acute cystitis without hematuria  ? ? ? ?Rx / DC Orders  ? ?ED Discharge Orders   ? ?      Ordered  ?  ciprofloxacin (CIPRO) 500 MG tablet  2 times daily       ? 03/14/21 2001  ? ?  ?  ? ?  ? ? ? ?Note:  This document was prepared using Dragon voice recognition software and may include unintentional dictation errors. ?  ?Lucrezia Starch, MD ?03/14/21 2003 ? ?  ?Lucrezia Starch, MD ?03/14/21 2052 ? ?

## 2021-03-14 NOTE — ED Notes (Signed)
Bladder scan showed 246 mL ?

## 2021-03-16 LAB — URINE CULTURE

## 2021-09-01 ENCOUNTER — Other Ambulatory Visit: Payer: Self-pay

## 2021-09-01 ENCOUNTER — Encounter: Payer: Self-pay | Admitting: Emergency Medicine

## 2021-09-01 DIAGNOSIS — N400 Enlarged prostate without lower urinary tract symptoms: Secondary | ICD-10-CM | POA: Diagnosis present

## 2021-09-01 DIAGNOSIS — Z7901 Long term (current) use of anticoagulants: Secondary | ICD-10-CM

## 2021-09-01 DIAGNOSIS — F0153 Vascular dementia, unspecified severity, with mood disturbance: Secondary | ICD-10-CM | POA: Diagnosis present

## 2021-09-01 DIAGNOSIS — I2510 Atherosclerotic heart disease of native coronary artery without angina pectoris: Secondary | ICD-10-CM | POA: Diagnosis present

## 2021-09-01 DIAGNOSIS — E860 Dehydration: Secondary | ICD-10-CM | POA: Diagnosis present

## 2021-09-01 DIAGNOSIS — I482 Chronic atrial fibrillation, unspecified: Secondary | ICD-10-CM | POA: Diagnosis present

## 2021-09-01 DIAGNOSIS — E8722 Chronic metabolic acidosis: Secondary | ICD-10-CM | POA: Diagnosis present

## 2021-09-01 DIAGNOSIS — I129 Hypertensive chronic kidney disease with stage 1 through stage 4 chronic kidney disease, or unspecified chronic kidney disease: Secondary | ICD-10-CM | POA: Diagnosis present

## 2021-09-01 DIAGNOSIS — R569 Unspecified convulsions: Secondary | ICD-10-CM | POA: Diagnosis present

## 2021-09-01 DIAGNOSIS — N179 Acute kidney failure, unspecified: Secondary | ICD-10-CM | POA: Diagnosis present

## 2021-09-01 DIAGNOSIS — D472 Monoclonal gammopathy: Secondary | ICD-10-CM | POA: Diagnosis present

## 2021-09-01 DIAGNOSIS — N2581 Secondary hyperparathyroidism of renal origin: Secondary | ICD-10-CM | POA: Diagnosis present

## 2021-09-01 DIAGNOSIS — Z888 Allergy status to other drugs, medicaments and biological substances status: Secondary | ICD-10-CM

## 2021-09-01 DIAGNOSIS — B952 Enterococcus as the cause of diseases classified elsewhere: Secondary | ICD-10-CM | POA: Diagnosis present

## 2021-09-01 DIAGNOSIS — Z20822 Contact with and (suspected) exposure to covid-19: Secondary | ICD-10-CM | POA: Diagnosis present

## 2021-09-01 DIAGNOSIS — Z881 Allergy status to other antibiotic agents status: Secondary | ICD-10-CM

## 2021-09-01 DIAGNOSIS — Y830 Surgical operation with transplant of whole organ as the cause of abnormal reaction of the patient, or of later complication, without mention of misadventure at the time of the procedure: Secondary | ICD-10-CM | POA: Diagnosis present

## 2021-09-01 DIAGNOSIS — Z7951 Long term (current) use of inhaled steroids: Secondary | ICD-10-CM

## 2021-09-01 DIAGNOSIS — N184 Chronic kidney disease, stage 4 (severe): Secondary | ICD-10-CM | POA: Diagnosis present

## 2021-09-01 DIAGNOSIS — N136 Pyonephrosis: Secondary | ICD-10-CM | POA: Diagnosis present

## 2021-09-01 DIAGNOSIS — Z79621 Long term (current) use of calcineurin inhibitor: Secondary | ICD-10-CM

## 2021-09-01 DIAGNOSIS — T8619 Other complication of kidney transplant: Secondary | ICD-10-CM | POA: Diagnosis present

## 2021-09-01 DIAGNOSIS — J690 Pneumonitis due to inhalation of food and vomit: Secondary | ICD-10-CM | POA: Diagnosis not present

## 2021-09-01 DIAGNOSIS — E875 Hyperkalemia: Secondary | ICD-10-CM | POA: Diagnosis present

## 2021-09-01 DIAGNOSIS — D631 Anemia in chronic kidney disease: Secondary | ICD-10-CM | POA: Diagnosis present

## 2021-09-01 DIAGNOSIS — E1122 Type 2 diabetes mellitus with diabetic chronic kidney disease: Secondary | ICD-10-CM | POA: Diagnosis present

## 2021-09-01 DIAGNOSIS — C9000 Multiple myeloma not having achieved remission: Secondary | ICD-10-CM | POA: Diagnosis present

## 2021-09-01 DIAGNOSIS — T8613 Kidney transplant infection: Secondary | ICD-10-CM | POA: Diagnosis present

## 2021-09-01 DIAGNOSIS — E785 Hyperlipidemia, unspecified: Secondary | ICD-10-CM | POA: Diagnosis present

## 2021-09-01 DIAGNOSIS — E1140 Type 2 diabetes mellitus with diabetic neuropathy, unspecified: Secondary | ICD-10-CM | POA: Diagnosis present

## 2021-09-01 DIAGNOSIS — Z79899 Other long term (current) drug therapy: Secondary | ICD-10-CM

## 2021-09-01 DIAGNOSIS — Z8249 Family history of ischemic heart disease and other diseases of the circulatory system: Secondary | ICD-10-CM

## 2021-09-01 LAB — BASIC METABOLIC PANEL
Anion gap: 10 (ref 5–15)
BUN: 60 mg/dL — ABNORMAL HIGH (ref 8–23)
CO2: 23 mmol/L (ref 22–32)
Calcium: 8.7 mg/dL — ABNORMAL LOW (ref 8.9–10.3)
Chloride: 108 mmol/L (ref 98–111)
Creatinine, Ser: 4.31 mg/dL — ABNORMAL HIGH (ref 0.61–1.24)
GFR, Estimated: 15 mL/min — ABNORMAL LOW (ref >60)
Glucose, Bld: 154 mg/dL — ABNORMAL HIGH (ref 70–99)
Potassium: 5.7 mmol/L — ABNORMAL HIGH (ref 3.5–5.1)
Sodium: 141 mmol/L (ref 135–145)

## 2021-09-01 LAB — CBC
HCT: 23.9 % — ABNORMAL LOW (ref 39.0–52.0)
Hemoglobin: 7.1 g/dL — ABNORMAL LOW (ref 13.0–17.0)
MCH: 23.1 pg — ABNORMAL LOW (ref 26.0–34.0)
MCHC: 29.7 g/dL — ABNORMAL LOW (ref 30.0–36.0)
MCV: 77.9 fL — ABNORMAL LOW (ref 80.0–100.0)
Platelets: 169 10*3/uL (ref 150–400)
RBC: 3.07 MIL/uL — ABNORMAL LOW (ref 4.22–5.81)
RDW: 21.9 % — ABNORMAL HIGH (ref 11.5–15.5)
WBC: 10.6 10*3/uL — ABNORMAL HIGH (ref 4.0–10.5)
nRBC: 0 % (ref 0.0–0.2)

## 2021-09-01 LAB — CBG MONITORING, ED: Glucose-Capillary: 149 mg/dL — ABNORMAL HIGH (ref 70–99)

## 2021-09-01 MED ORDER — SODIUM CHLORIDE 0.9 % IV BOLUS: 1000.0000 mL | Freq: Once | INTRAVENOUS | Status: DC

## 2021-09-01 NOTE — ED Notes (Signed)
First Nurse Note: Pt to ED via [facility] from [facility] for vomiting x 2 days. Pt wears O2 at night. Pts RA sat was 91%, pt put on 3 liters with sats improving to 97%. Pt also has abnormal blood work. Pt has hx/o kidney transplant 5 years ago.

## 2021-09-01 NOTE — ED Notes (Signed)
PT is incontinent and can not give a urine sample on his own.

## 2021-09-01 NOTE — ED Triage Notes (Deleted)
Pt reports shortness of breath for 2 week, reports productive cough yellow, green in color. Pt reports fatigued, talks in complete sentences no respiratory distress noted

## 2021-09-02 ENCOUNTER — Encounter: Payer: Self-pay | Admitting: Family Medicine

## 2021-09-02 ENCOUNTER — Emergency Department: Payer: No Typology Code available for payment source

## 2021-09-02 ENCOUNTER — Inpatient Hospital Stay
Admission: EM | Admit: 2021-09-02 | Discharge: 2021-09-06 | DRG: 178 | Disposition: A | Discharge status: Skilled Nursing Facility | Payer: No Typology Code available for payment source | Source: Skilled Nursing Facility | Attending: Internal Medicine | Admitting: Internal Medicine

## 2021-09-02 ENCOUNTER — Inpatient Hospital Stay: Payer: No Typology Code available for payment source

## 2021-09-02 DIAGNOSIS — T8613 Kidney transplant infection: Secondary | ICD-10-CM | POA: Diagnosis present

## 2021-09-02 DIAGNOSIS — Z888 Allergy status to other drugs, medicaments and biological substances status: Secondary | ICD-10-CM | POA: Diagnosis not present

## 2021-09-02 DIAGNOSIS — I129 Hypertensive chronic kidney disease with stage 1 through stage 4 chronic kidney disease, or unspecified chronic kidney disease: Secondary | ICD-10-CM | POA: Diagnosis present

## 2021-09-02 DIAGNOSIS — I10 Essential (primary) hypertension: Secondary | ICD-10-CM | POA: Diagnosis present

## 2021-09-02 DIAGNOSIS — I482 Chronic atrial fibrillation, unspecified: Secondary | ICD-10-CM | POA: Diagnosis present

## 2021-09-02 DIAGNOSIS — R569 Unspecified convulsions: Secondary | ICD-10-CM | POA: Diagnosis present

## 2021-09-02 DIAGNOSIS — E875 Hyperkalemia: Secondary | ICD-10-CM | POA: Diagnosis present

## 2021-09-02 DIAGNOSIS — I4891 Unspecified atrial fibrillation: Secondary | ICD-10-CM | POA: Diagnosis present

## 2021-09-02 DIAGNOSIS — R531 Weakness: Principal | ICD-10-CM

## 2021-09-02 DIAGNOSIS — E785 Hyperlipidemia, unspecified: Secondary | ICD-10-CM | POA: Diagnosis present

## 2021-09-02 DIAGNOSIS — N184 Chronic kidney disease, stage 4 (severe): Secondary | ICD-10-CM | POA: Diagnosis present

## 2021-09-02 DIAGNOSIS — N138 Other obstructive and reflux uropathy: Secondary | ICD-10-CM | POA: Diagnosis not present

## 2021-09-02 DIAGNOSIS — J690 Pneumonitis due to inhalation of food and vomit: Secondary | ICD-10-CM | POA: Diagnosis present

## 2021-09-02 DIAGNOSIS — E860 Dehydration: Secondary | ICD-10-CM | POA: Diagnosis present

## 2021-09-02 DIAGNOSIS — Z881 Allergy status to other antibiotic agents status: Secondary | ICD-10-CM | POA: Diagnosis not present

## 2021-09-02 DIAGNOSIS — B952 Enterococcus as the cause of diseases classified elsewhere: Secondary | ICD-10-CM | POA: Diagnosis present

## 2021-09-02 DIAGNOSIS — C9000 Multiple myeloma not having achieved remission: Secondary | ICD-10-CM | POA: Diagnosis present

## 2021-09-02 DIAGNOSIS — N400 Enlarged prostate without lower urinary tract symptoms: Secondary | ICD-10-CM | POA: Diagnosis present

## 2021-09-02 DIAGNOSIS — Y830 Surgical operation with transplant of whole organ as the cause of abnormal reaction of the patient, or of later complication, without mention of misadventure at the time of the procedure: Secondary | ICD-10-CM | POA: Diagnosis present

## 2021-09-02 DIAGNOSIS — N2581 Secondary hyperparathyroidism of renal origin: Secondary | ICD-10-CM | POA: Diagnosis present

## 2021-09-02 DIAGNOSIS — D631 Anemia in chronic kidney disease: Secondary | ICD-10-CM | POA: Diagnosis present

## 2021-09-02 DIAGNOSIS — N390 Urinary tract infection, site not specified: Secondary | ICD-10-CM | POA: Diagnosis present

## 2021-09-02 DIAGNOSIS — E1129 Type 2 diabetes mellitus with other diabetic kidney complication: Secondary | ICD-10-CM | POA: Diagnosis present

## 2021-09-02 DIAGNOSIS — R112 Nausea with vomiting, unspecified: Secondary | ICD-10-CM | POA: Diagnosis present

## 2021-09-02 DIAGNOSIS — N401 Enlarged prostate with lower urinary tract symptoms: Secondary | ICD-10-CM | POA: Diagnosis not present

## 2021-09-02 DIAGNOSIS — T8619 Other complication of kidney transplant: Secondary | ICD-10-CM | POA: Diagnosis present

## 2021-09-02 DIAGNOSIS — E8722 Chronic metabolic acidosis: Secondary | ICD-10-CM | POA: Diagnosis present

## 2021-09-02 DIAGNOSIS — N179 Acute kidney failure, unspecified: Secondary | ICD-10-CM | POA: Diagnosis present

## 2021-09-02 DIAGNOSIS — Z20822 Contact with and (suspected) exposure to covid-19: Secondary | ICD-10-CM | POA: Diagnosis present

## 2021-09-02 DIAGNOSIS — N136 Pyonephrosis: Secondary | ICD-10-CM | POA: Diagnosis present

## 2021-09-02 DIAGNOSIS — F0153 Vascular dementia, unspecified severity, with mood disturbance: Secondary | ICD-10-CM | POA: Diagnosis present

## 2021-09-02 DIAGNOSIS — F32A Depression, unspecified: Secondary | ICD-10-CM | POA: Diagnosis present

## 2021-09-02 DIAGNOSIS — N3001 Acute cystitis with hematuria: Secondary | ICD-10-CM | POA: Diagnosis not present

## 2021-09-02 DIAGNOSIS — E1140 Type 2 diabetes mellitus with diabetic neuropathy, unspecified: Secondary | ICD-10-CM | POA: Diagnosis present

## 2021-09-02 DIAGNOSIS — Z940 Kidney transplant status: Secondary | ICD-10-CM | POA: Diagnosis not present

## 2021-09-02 DIAGNOSIS — D649 Anemia, unspecified: Secondary | ICD-10-CM

## 2021-09-02 DIAGNOSIS — N189 Chronic kidney disease, unspecified: Secondary | ICD-10-CM | POA: Diagnosis present

## 2021-09-02 DIAGNOSIS — E1122 Type 2 diabetes mellitus with diabetic chronic kidney disease: Secondary | ICD-10-CM | POA: Diagnosis present

## 2021-09-02 LAB — LACTIC ACID, PLASMA
Lactic Acid, Venous: 0.7 mmol/L (ref 0.5–1.9)
Lactic Acid, Venous: 0.6 mmol/L (ref 0.5–1.9)

## 2021-09-02 LAB — URINALYSIS, ROUTINE W REFLEX MICROSCOPIC
Bilirubin Urine: NEGATIVE
Glucose, UA: NEGATIVE mg/dL
Hgb urine dipstick: NEGATIVE
Ketones, ur: NEGATIVE mg/dL
Nitrite: NEGATIVE
Protein, ur: 100 mg/dL — AB
Specific Gravity, Urine: 1.011 (ref 1.005–1.030)
Squamous Epithelial / HPF: NONE SEEN (ref 0–5)
WBC, UA: >50 WBC/hpf — ABNORMAL HIGH (ref 0–5)
pH: 7 (ref 5.0–8.0)

## 2021-09-02 LAB — PROCALCITONIN: Procalcitonin: 0.39 ng/mL

## 2021-09-02 LAB — GLUCOSE, CAPILLARY
Glucose-Capillary: 91 mg/dL (ref 70–99)
Glucose-Capillary: 137 mg/dL — ABNORMAL HIGH (ref 70–99)

## 2021-09-02 LAB — SARS CORONAVIRUS 2 BY RT PCR: SARS Coronavirus 2 by RT PCR: NEGATIVE

## 2021-09-02 LAB — HEPATIC FUNCTION PANEL
ALT: 9 U/L (ref 0–44)
AST: 18 U/L (ref 15–41)
Albumin: 3.2 g/dL — ABNORMAL LOW (ref 3.5–5.0)
Alkaline Phosphatase: 46 U/L (ref 38–126)
Bilirubin, Direct: 0.1 mg/dL (ref 0.0–0.2)
Indirect Bilirubin: 0.8 mg/dL (ref 0.3–0.9)
Total Bilirubin: 0.9 mg/dL (ref 0.3–1.2)
Total Protein: 7.3 g/dL (ref 6.5–8.1)

## 2021-09-02 LAB — LIPASE, BLOOD: Lipase: 22 U/L (ref 11–51)

## 2021-09-02 LAB — TROPONIN I (HIGH SENSITIVITY): Troponin I (High Sensitivity): 15 ng/L (ref <18)

## 2021-09-02 LAB — POTASSIUM: Potassium: 4.7 mmol/L (ref 3.5–5.1)

## 2021-09-02 MED ORDER — DIPHENHYDRAMINE HCL 25 MG PO TABS: 25.0000 mg | ORAL_TABLET | Freq: Four times a day (QID) | ORAL | Status: DC | PRN

## 2021-09-02 MED ORDER — SODIUM CHLORIDE 0.9 % IV SOLN
3.0000 g | Freq: Once | INTRAVENOUS | Status: DC
  Filled 2021-09-02: qty 8

## 2021-09-02 MED ORDER — GUAIFENESIN ER 600 MG PO TB12
600.0000 mg | ORAL_TABLET | Freq: Two times a day (BID) | ORAL | Status: DC
  Administered 2021-09-02 – 2021-09-06 (×9): 600 mg via ORAL
  Filled 2021-09-02 (×9): qty 1

## 2021-09-02 MED ORDER — SODIUM CHLORIDE 0.9 % IV BOLUS: 500.0000 mL | Freq: Once | INTRAVENOUS | Status: DC

## 2021-09-02 MED ORDER — VITAMIN D 25 MCG (1000 UNIT) PO TABS
2000.0000 [IU] | ORAL_TABLET | Freq: Every day | ORAL | Status: DC
Start: 2021-09-02 — End: 2021-09-06
  Administered 2021-09-02 – 2021-09-05 (×4): 2000 [IU] via ORAL
  Filled 2021-09-02 (×4): qty 2

## 2021-09-02 MED ORDER — FLUTICASONE FUROATE-VILANTEROL 200-25 MCG/ACT IN AEPB
1.0000 | INHALATION_SPRAY | Freq: Every day | RESPIRATORY_TRACT | Status: DC
  Administered 2021-09-02 – 2021-09-06 (×4): 1 via RESPIRATORY_TRACT
  Filled 2021-09-02: qty 28

## 2021-09-02 MED ORDER — AMLODIPINE BESYLATE 10 MG PO TABS
10.0000 mg | ORAL_TABLET | Freq: Every day | ORAL | Status: DC
  Administered 2021-09-02 – 2021-09-06 (×5): 10 mg via ORAL
  Filled 2021-09-02 (×5): qty 1

## 2021-09-02 MED ORDER — DULOXETINE HCL 30 MG PO CPEP
60.0000 mg | ORAL_CAPSULE | Freq: Every day | ORAL | Status: DC
  Administered 2021-09-02 – 2021-09-06 (×5): 60 mg via ORAL
  Filled 2021-09-02 (×5): qty 2

## 2021-09-02 MED ORDER — SODIUM ZIRCONIUM CYCLOSILICATE 10 G PO PACK
10.0000 g | PACK | Freq: Once | ORAL | Status: AC
  Administered 2021-09-02: 10 g via ORAL
  Filled 2021-09-02: qty 1

## 2021-09-02 MED ORDER — HEPARIN SODIUM (PORCINE) 5000 UNIT/ML IJ SOLN: 5000.0000 [IU] | Freq: Three times a day (TID) | INTRAMUSCULAR | Status: DC

## 2021-09-02 MED ORDER — APIXABAN 5 MG PO TABS
5.0000 mg | ORAL_TABLET | Freq: Two times a day (BID) | ORAL | Status: DC
Start: 2021-09-02 — End: 2021-09-03
  Administered 2021-09-02 (×2): 5 mg via ORAL
  Filled 2021-09-02 (×3): qty 1

## 2021-09-02 MED ORDER — INSULIN ASPART 100 UNIT/ML IJ SOLN: 0.0000 [IU] | Freq: Every day | INTRAMUSCULAR | Status: DC

## 2021-09-02 MED ORDER — MAGNESIUM HYDROXIDE 400 MG/5ML PO SUSP: 30.0000 mL | Freq: Every day | ORAL | Status: DC | PRN

## 2021-09-02 MED ORDER — CYCLOSPORINE 25 MG PO CAPS
75.0000 mg | ORAL_CAPSULE | Freq: Two times a day (BID) | ORAL | Status: DC
  Administered 2021-09-02 – 2021-09-06 (×9): 75 mg via ORAL
  Filled 2021-09-02 (×10): qty 3

## 2021-09-02 MED ORDER — LACOSAMIDE 50 MG PO TABS
150.0000 mg | ORAL_TABLET | Freq: Two times a day (BID) | ORAL | Status: DC
  Administered 2021-09-02 – 2021-09-06 (×9): 150 mg via ORAL
  Filled 2021-09-02 (×9): qty 3

## 2021-09-02 MED ORDER — SODIUM CHLORIDE 0.9 % IV SOLN: INTRAVENOUS | Status: DC

## 2021-09-02 MED ORDER — FERROUS SULFATE 325 (65 FE) MG PO TABS
325.0000 mg | ORAL_TABLET | Freq: Every day | ORAL | Status: DC
  Administered 2021-09-03 – 2021-09-06 (×4): 325 mg via ORAL
  Filled 2021-09-02 (×4): qty 1

## 2021-09-02 MED ORDER — INSULIN ASPART 100 UNIT/ML IV SOLN
8.0000 [IU] | Freq: Once | INTRAVENOUS | Status: AC
  Administered 2021-09-02: 8 [IU] via INTRAVENOUS
  Filled 2021-09-02 (×2): qty 0.08

## 2021-09-02 MED ORDER — SODIUM CHLORIDE 0.9 % IV BOLUS: 1000.0000 mL | Freq: Once | INTRAVENOUS | Status: DC

## 2021-09-02 MED ORDER — ONDANSETRON HCL 4 MG PO TABS: 4.0000 mg | ORAL_TABLET | Freq: Four times a day (QID) | ORAL | Status: DC | PRN

## 2021-09-02 MED ORDER — MYCOPHENOLATE SODIUM 180 MG PO TBEC
180.0000 mg | DELAYED_RELEASE_TABLET | Freq: Two times a day (BID) | ORAL | Status: DC
  Administered 2021-09-02 – 2021-09-06 (×9): 180 mg via ORAL
  Filled 2021-09-02 (×9): qty 1

## 2021-09-02 MED ORDER — SODIUM CHLORIDE 0.9 % IV SOLN
2.0000 g | INTRAVENOUS | Status: DC
  Administered 2021-09-02: 2 g via INTRAVENOUS
  Filled 2021-09-02: qty 2

## 2021-09-02 MED ORDER — ALBUTEROL SULFATE (2.5 MG/3ML) 0.083% IN NEBU: 2.5000 mg | INHALATION_SOLUTION | RESPIRATORY_TRACT | Status: DC | PRN

## 2021-09-02 MED ORDER — CARVEDILOL 25 MG PO TABS
25.0000 mg | ORAL_TABLET | Freq: Two times a day (BID) | ORAL | Status: DC
  Administered 2021-09-02 – 2021-09-06 (×9): 25 mg via ORAL
  Filled 2021-09-02 (×9): qty 1

## 2021-09-02 MED ORDER — SODIUM BICARBONATE 650 MG PO TABS
650.0000 mg | ORAL_TABLET | Freq: Three times a day (TID) | ORAL | Status: DC
  Administered 2021-09-02 – 2021-09-06 (×13): 650 mg via ORAL
  Filled 2021-09-02 (×14): qty 1

## 2021-09-02 MED ORDER — ASCORBIC ACID 500 MG PO TABS
500.0000 mg | ORAL_TABLET | Freq: Every day | ORAL | Status: DC
  Administered 2021-09-02 – 2021-09-06 (×5): 500 mg via ORAL
  Filled 2021-09-02 (×5): qty 1

## 2021-09-02 MED ORDER — SODIUM CHLORIDE 0.9 % IV SOLN
500.0000 mg | INTRAVENOUS | Status: DC
  Administered 2021-09-02: 500 mg via INTRAVENOUS
  Filled 2021-09-02: qty 5

## 2021-09-02 MED ORDER — TAMSULOSIN HCL 0.4 MG PO CAPS
0.8000 mg | ORAL_CAPSULE | Freq: Every day | ORAL | Status: DC
  Administered 2021-09-02 – 2021-09-06 (×5): 0.8 mg via ORAL
  Filled 2021-09-02 (×5): qty 2

## 2021-09-02 MED ORDER — ACETAMINOPHEN 325 MG PO TABS: 650.0000 mg | ORAL_TABLET | Freq: Four times a day (QID) | ORAL | Status: DC | PRN

## 2021-09-02 MED ORDER — ACETAMINOPHEN 650 MG RE SUPP: 650.0000 mg | Freq: Four times a day (QID) | RECTAL | Status: DC | PRN

## 2021-09-02 MED ORDER — FOLIC ACID 1 MG PO TABS
1.0000 mg | ORAL_TABLET | Freq: Every day | ORAL | Status: DC
  Administered 2021-09-02 – 2021-09-06 (×5): 1 mg via ORAL
  Filled 2021-09-02 (×5): qty 1

## 2021-09-02 MED ORDER — DEXTROSE 50 % IV SOLN
50.0000 mL | Freq: Once | INTRAVENOUS | Status: AC
  Administered 2021-09-02: 50 mL via INTRAVENOUS
  Filled 2021-09-02: qty 50

## 2021-09-02 MED ORDER — IPRATROPIUM-ALBUTEROL 0.5-2.5 (3) MG/3ML IN SOLN
3.0000 mL | Freq: Four times a day (QID) | RESPIRATORY_TRACT | Status: DC
  Administered 2021-09-02 – 2021-09-03 (×5): 3 mL via RESPIRATORY_TRACT
  Filled 2021-09-02 (×5): qty 3

## 2021-09-02 MED ORDER — ATORVASTATIN CALCIUM 10 MG PO TABS
10.0000 mg | ORAL_TABLET | Freq: Every day | ORAL | Status: DC
  Administered 2021-09-02 – 2021-09-06 (×5): 10 mg via ORAL
  Filled 2021-09-02 (×5): qty 1

## 2021-09-02 MED ORDER — HYDRALAZINE HCL 20 MG/ML IJ SOLN
5.0000 mg | INTRAMUSCULAR | Status: DC | PRN
Start: 2021-09-02 — End: 2021-09-06

## 2021-09-02 MED ORDER — ENOXAPARIN SODIUM 40 MG/0.4ML IJ SOSY: 40.0000 mg | PREFILLED_SYRINGE | INTRAMUSCULAR | Status: DC

## 2021-09-02 MED ORDER — POLYETHYLENE GLYCOL 3350 17 G PO PACK
17.0000 g | PACK | Freq: Every day | ORAL | Status: DC
  Administered 2021-09-02 – 2021-09-06 (×5): 17 g via ORAL
  Filled 2021-09-02 (×5): qty 1

## 2021-09-02 MED ORDER — FINASTERIDE 5 MG PO TABS
5.0000 mg | ORAL_TABLET | Freq: Every day | ORAL | Status: DC
  Administered 2021-09-02 – 2021-09-06 (×5): 5 mg via ORAL
  Filled 2021-09-02 (×5): qty 1

## 2021-09-02 MED ORDER — LORAZEPAM 2 MG/ML IJ SOLN
1.0000 mg | INTRAMUSCULAR | Status: DC | PRN
  Filled 2021-09-02: qty 1

## 2021-09-02 MED ORDER — HYDRALAZINE HCL 50 MG PO TABS
75.0000 mg | ORAL_TABLET | Freq: Three times a day (TID) | ORAL | Status: DC
  Administered 2021-09-02 – 2021-09-06 (×13): 75 mg via ORAL
  Filled 2021-09-02 (×14): qty 1

## 2021-09-02 MED ORDER — ONDANSETRON HCL 4 MG/2ML IJ SOLN: 4.0000 mg | Freq: Four times a day (QID) | INTRAMUSCULAR | Status: DC | PRN

## 2021-09-02 MED ORDER — INSULIN ASPART 100 UNIT/ML IJ SOLN
0.0000 [IU] | Freq: Three times a day (TID) | INTRAMUSCULAR | Status: DC
  Administered 2021-09-05: 2 [IU] via SUBCUTANEOUS
  Administered 2021-09-06: 1 [IU] via SUBCUTANEOUS
  Filled 2021-09-02: qty 1

## 2021-09-02 MED ORDER — SODIUM CHLORIDE 0.9 % IV BOLUS
500.0000 mL | Freq: Once | INTRAVENOUS | Status: AC
  Administered 2021-09-02: 500 mL via INTRAVENOUS

## 2021-09-02 MED ORDER — TRAZODONE HCL 50 MG PO TABS
25.0000 mg | ORAL_TABLET | Freq: Every evening | ORAL | Status: DC | PRN
  Administered 2021-09-02: 25 mg via ORAL
  Filled 2021-09-02: qty 1

## 2021-09-02 MED ORDER — SODIUM CHLORIDE 0.9 % IV SOLN
3.0000 g | Freq: Two times a day (BID) | INTRAVENOUS | Status: DC
  Administered 2021-09-03 – 2021-09-06 (×6): 3 g via INTRAVENOUS
  Filled 2021-09-02 (×3): qty 8
  Filled 2021-09-02 (×2): qty 3
  Filled 2021-09-02 (×3): qty 8

## 2021-09-02 NOTE — Assessment & Plan Note (Signed)
-   Continue home medications 

## 2021-09-02 NOTE — Consult Note (Signed)
Pharmacy Antibiotic Note  Jared Gregory is a 66 y.o. male admitted on 09/02/2021 with  aspiration pneumonia . Pharmacy has been consulted for Unasyn dosing.  Assessment: Pt is 66 yo M with PMH renal transplant, hypertension, diabetes, atrial fibrillation on Eliquis. WBC slightly elevated at 10.6, but pt is afebrile and VSS. COVID PCR negative and Bcx collected, NGTD @ <12 hrs. CXR shows small pleural effusions, with multilobal opacities, most likely reflecting superimposed multifocal pneumonia. Pt's is in AKI and Scr is significantly elevated, at 4.31 up from baseline Scr 3.1.  Plan: Begin Unasyn 3 g q12H (dose adjusted for CrCl 10-30) Follow up with Bcx and sputum results as they return Monitor signs & symptoms of infection to assess for antibiotic efficacy Monitor Scr to assess for changes in renal function that would warrant adjustment of Unasyn frequency adjustment, especially since pt is in AKI  Height: '5\' 9"'$  (175.3 cm) Weight: 67.1 kg (148 lb) IBW/kg (Calculated) : 70.7  Temp (24hrs), Avg:97.9 F (36.6 C), Min:97.8 F (36.6 C), Max:98.2 F (36.8 C)  Recent Labs  Lab 09/01/21 1952 09/02/21 0458 09/02/21 0637  WBC 10.6*  --   --   CREATININE 4.31*  --   --   LATICACIDVEN  --  0.7 0.6    Estimated Creatinine Clearance: 16.4 mL/min (A) (by C-G formula based on SCr of 4.31 mg/dL (H)).    Allergies  Allergen Reactions   Baclofen    Lisinopril    Remeron [Mirtazapine]    Vancomycin    Zofran [Ondansetron]     Antimicrobials this admission: Ceftriaxone 8/26 x 1 Azithromycin 8/26 x 1   Microbiology results: 8/26 BCx: NGTD @ <12 hrs 8/26 Sputum: collected  Thank you for allowing pharmacy to be a part of this patient's care.  Dara Hoyer, PharmD PGY-1 Pharmacy Resident 09/02/2021 11:24 AM

## 2021-09-02 NOTE — Assessment & Plan Note (Signed)
Hemoglobin 7.1 (7.9 on 03/14/2021) -Continue iron supplement

## 2021-09-02 NOTE — ED Provider Notes (Signed)
[facility] Provider Note    Event Date/Time   First MD Initiated Contact with Patient 09/02/21 0205     (approximate)   History   Weakness   HPI  Jared Gregory is a 66 y.o. male brought to the ED via EMS from [facility] with a chief complaint of nausea/vomiting x2 days.  With a history of renal transplant, hypertension, diabetes, atrial fibrillation on Eliquis.  Denies fever, cough, chest pain, shortness of breath, dysuria or diarrhea.  Denies black or bloody stools.     Past Medical History   Past Medical History:  Diagnosis Date   Atrial fibrillation ([place])    Diabetes ([place])    HTN (hypertension)    Hyperlipidemia    Renal transplant recipient    Vascular dementia [facility])      Active Problem List   Patient Active Problem List   Diagnosis Date Noted   AKI (acute kidney injury) ([place])    Renal transplant recipient    Syncope    Seizure-like activity ([place]) 01/10/2021   Depression 01/10/2021   Essential hypertension 01/10/2021   On continuous oral anticoagulation 01/10/2021   BPH (benign prostatic hyperplasia) 01/10/2021     Past Surgical History   Past Surgical History:  Procedure Laterality Date   KIDNEY TRANSPLANT       Home Medications   Prior to Admission medications   Medication Sig Start Date End Date Taking? Authorizing Provider  acetaminophen (TYLENOL) 325 MG tablet Take 2 tablets by mouth every 6 (six) hours as needed.    [provider]  amLODipine (NORVASC) 10 MG tablet Take 10 mg by mouth daily. 07/29/20   [provider]  atorvastatin (LIPITOR) 10 MG tablet Take 10 mg by mouth daily. 01/19/21   [provider]  atorvastatin (LIPITOR) 40 MG tablet Take 40 mg by mouth daily as needed. Patient not taking: Reported on 03/14/2021 07/29/20   [provider]  Cholecalciferol (VITAMIN D3) 50 MCG (2000 UT) TABS Take 1 tablet by mouth in the morning and at bedtime. 07/29/20    [provider]  Cholecalciferol 25 MCG (1000 UT) tablet Take 2 tablets by mouth daily. Patient not taking: Reported on 03/14/2021 11/29/20   [provider]  cloNIDine (CATAPRES) 0.1 MG tablet Take 1 tablet by mouth every 4 (four) hours as needed. 03/06/21   [provider]  cycloSPORINE (SANDIMMUNE) 25 MG capsule Take 3 capsules by mouth 2 (two) times daily. 07/29/20   [provider]  DULoxetine (CYMBALTA) 60 MG capsule Take 60 mg by mouth daily. 07/29/20   [provider]  ELIQUIS 5 MG TABS tablet Take 5 mg by mouth 2 (two) times daily. 08/30/20   [provider]  ferrous sulfate 325 (65 FE) MG tablet Take 325 mg by mouth daily with breakfast.    [provider]  finasteride (PROSCAR) 5 MG tablet Take 5 mg by mouth daily. 07/29/20   [provider]  fluticasone furoate-vilanterol (BREO ELLIPTA) 200-25 MCG/ACT AEPB Inhale 1 puff into the lungs daily. 01/20/21   Regalado, Belkys A, MD  folic acid (FOLVITE) 1 MG tablet Take 1 tablet (1 mg total) by mouth daily. 01/20/21   Regalado, Belkys A, MD  labetalol (NORMODYNE) 200 MG tablet Take 1 tablet (200 mg total) by mouth 3 (three) times daily. 01/19/21   Regalado, Belkys A, MD  lacosamide 150 MG TABS Take 1 tablet (150 mg total) by mouth 2 (two) times daily. 01/19/21 03/14/21  Regalado, Belkys A, MD  linagliptin (TRADJENTA) 5 MG TABS tablet Take 1 tablet (5 mg total) by mouth daily. 01/20/21   Regalado, Belkys A, MD  LORazepam (ATIVAN) 1 MG tablet Take 1 mg by mouth 2 (two) times daily. 03/08/21   [provider]  mycophenolate (MYFORTIC) 180 MG EC tablet Take 180 mg by mouth 2 (two) times daily. 08/01/20   [provider]  polyethylene glycol (MIRALAX / GLYCOLAX) 17 g packet Take 17 g by mouth daily. 01/20/21   Regalado, Belkys A, MD  sodium bicarbonate 650 MG tablet Take 1 tablet by mouth 3 (three) times daily. 08/01/20   [provider]  tamsulosin (FLOMAX) 0.4 MG CAPS  capsule Take 0.8 mg by mouth daily. 07/29/20   [provider]  torsemide (DEMADEX) 20 MG tablet Take 1 tablet by mouth daily. 02/21/21   [provider]     Allergies  Baclofen, Lisinopril, Remeron [mirtazapine], Vancomycin, and Zofran [ondansetron]   Family History  No family history on file.   Physical Exam  Triage Vital Signs: ED Triage Vitals  Enc Vitals Group     BP 09/01/21 1933 (!) 152/84     Pulse Rate 09/01/21 1908 89     Resp 09/01/21 1908 16     Temp 09/01/21 1933 97.8 F (36.6 C)     Temp Source 09/01/21 1933 Oral     SpO2 09/01/21 1933 98 %     Weight 09/01/21 1936 148 lb (67.1 kg)     Height 09/01/21 1936 '5\' 9"'$  (1.753 m)     Head Circumference --      Peak Flow --      Pain Score 09/01/21 1905 3     Pain Loc --      Pain Edu? --      Excl. in [place]? --     Updated Vital Signs: BP (!) 157/89 (BP Location: Right Arm)   Pulse 71   Temp 97.8 F (36.6 C) (Oral)   Resp 16   Ht '5\' 9"'$  (1.753 m)   Wt 67.1 kg   SpO2 95%   BMI 21.86 kg/m    General: Awake, no distress.  CV:  RRR.  Good peripheral perfusion.  Resp:  Normal effort.  Scattered rhonchi. Abd:  Nontender to light or deep palpation.  No distention.  Other:  Pale.  Mildly dry mucous membranes.   ED Results / Procedures / Treatments  Labs (all labs ordered are listed, but only abnormal results are displayed) Labs Reviewed  BASIC METABOLIC PANEL - Abnormal; Notable for the following components:      Result Value   Potassium 5.7 (*)    Glucose, Bld 154 (*)    BUN 60 (*)    Creatinine, Ser 4.31 (*)    Calcium 8.7 (*)    GFR, Estimated 15 (*)    All other components within normal limits  CBC - Abnormal; Notable for the following components:   WBC 10.6 (*)    RBC 3.07 (*)    Hemoglobin 7.1 (*)    HCT 23.9 (*)    MCV 77.9 (*)    MCH 23.1 (*)    MCHC 29.7 (*)    RDW 21.9 (*)    All other components within normal limits  HEPATIC FUNCTION PANEL - Abnormal; Notable for the  following components:   Albumin 3.2 (*)    All other components within normal limits  CBG MONITORING, ED - Abnormal; Notable for the following components:  Glucose-Capillary 149 (*)    All other components within normal limits  SARS CORONAVIRUS 2 BY RT PCR  CULTURE, BLOOD (ROUTINE X 2)  CULTURE, BLOOD (ROUTINE X 2)  LIPASE, BLOOD  URINALYSIS, ROUTINE W REFLEX MICROSCOPIC  LACTIC ACID, PLASMA  LACTIC ACID, PLASMA  PROCALCITONIN  TYPE AND SCREEN  TROPONIN I (HIGH SENSITIVITY)     EKG  ED ECG REPORT I, Xavyer Steenson J, the attending physician, personally viewed and interpreted this ECG.   Date: 09/02/2021  EKG Time: 1939  Rate: 75  Rhythm: normal sinus rhythm  Axis: Normal  Intervals:left anterior fascicular block  ST&T Change: Nonspecific    RADIOLOGY I have independently visualized and interpreted patient's chest x-ray and CT scan as well as noted the radiology interpretation:  Chest x-ray: CHF, pleural effusions, possible multifocal pneumonia  CT abdomen pelvis: Suspicious for aspiration, anasarca, no acute abdominal abnormality  Official radiology report(s): DG Chest Port 1 View  Result Date: 09/02/2021 CLINICAL DATA:  Vomiting and hypoxia. EXAM: PORTABLE CHEST 1 VIEW COMPARISON:  Chest CT 01/13/2021 cough portable chest 01/10/2022. FINDINGS: There is mild-to-moderate cardiomegaly. There is increased central vascular distension and flow cephalization and increased interstitial edema in the base of both lungs with small pleural effusions. There are patchy opacities in the left mid perihilar and lower lung fields, hazy interstitial consolidation in the right infrahilar area, findings most likely indicating superimposed multifocal pneumonia. Underlying edema component of airspace disease not excluded. The remaining lungs are generally clear. There is slight aortic tortuosity with stable mediastinum. No acute osseous abnormality. IMPRESSION: 1. Cardiomegaly, perihilar vascular  distension and basilar interstitial edema. Findings consistent with CHF or fluid overload. 2. Small pleural effusions, with opacities in the left mid and lower lung field and right infrahilar area which most likely reflect superimposed multifocal pneumonia, less likely alveolar edema. 3. Clinical correlation and radiographic follow-up recommended. Electronically Signed   By: Telford Nab M.D.   On: 09/02/2021 02:52   CT Abdomen Pelvis Wo Contrast  Result Date: 09/02/2021 CLINICAL DATA:  Nausea and vomiting for 2 days EXAM: CT ABDOMEN AND PELVIS WITHOUT CONTRAST TECHNIQUE: Multidetector CT imaging of the abdomen and pelvis was performed following the standard protocol without IV contrast. RADIATION DOSE REDUCTION: This exam was performed according to the departmental dose-optimization program which includes automated exposure control, adjustment of the mA and/or kV according to patient size and/or use of iterative reconstruction technique. COMPARISON:  None Available. FINDINGS: Lower chest: Lung bases demonstrate small bilateral pleural effusions right greater than left with associated atelectasis in the right base and patchy atelectatic changes in the left base. These changes suggest possible aspiration given some hyperdense material within the substance of the right lower lobe. Hepatobiliary: No focal liver abnormality is seen. No gallstones, gallbladder wall thickening, or biliary dilatation. Pancreas: Unremarkable. No pancreatic ductal dilatation or surrounding inflammatory changes. Spleen: Normal in size without focal abnormality. Adrenals/Urinary Tract: Adrenal glands are within normal limits. Heavy renal vascular calcifications are noted. No focal mass is seen. Small cysts are again identified in the kidneys bilaterally. No follow-up is recommended. No obstructive changes are seen. The bladder is well distended. A transplant kidney is noted in the right hemipelvis. Mild fullness of the transplant renal  pelvis and ureter is noted although no obstructing lesion is seen. Stomach/Bowel: Scattered fecal material is noted throughout the colon without obstructive change. The appendix is not discretely visualized although no inflammatory changes to suggest appendicitis are noted. Small bowel and stomach appear within normal limits.  Vascular/Lymphatic: Aortic atherosclerosis. No enlarged abdominal or pelvic lymph nodes. Reproductive: Prostate is unremarkable. Other: Mild changes of anasarca are noted.  No ascites is seen. Musculoskeletal: No acute or significant osseous findings. IMPRESSION: Bilateral pleural effusions right greater than left with associated atelectatic changes and findings suspicious for aspiration. Scattered colonic fecal material without evidence of obstruction. Changes of anasarca. Right pelvic transplant kidney with fullness of the collecting system and ureter although no obstructing lesion is noted. Electronically Signed   By: Inez Catalina M.D.   On: 09/02/2021 02:47     PROCEDURES:  Critical Care performed: Yes, see critical care procedure note(s)  CRITICAL CARE Performed by: Paulette Blanch   Total critical care time: 30 minutes  Critical care time was exclusive of separately billable procedures and treating other patients.  Critical care was necessary to treat or prevent imminent or life-threatening deterioration.  Critical care was time spent personally by me on the following activities: development of treatment plan with patient and/or surrogate as well as nursing, discussions with consultants, evaluation of patient's response to treatment, examination of patient, obtaining history from patient or surrogate, ordering and performing treatments and interventions, ordering and review of laboratory studies, ordering and review of radiographic studies, pulse oximetry and re-evaluation of patient's condition.   Marland Kitchen1-3 Lead EKG Interpretation  Performed by: Paulette Blanch, MD Authorized  by: Paulette Blanch, MD     Interpretation: normal     ECG rate:  75   ECG rate assessment: normal     Rhythm: sinus rhythm     Ectopy: none     Conduction: normal   Comments:     Patient placed on cardiac monitor to evaluate for arrhythmias    MEDICATIONS ORDERED IN ED: Medications  Ampicillin-Sulbactam (UNASYN) 3 g in sodium chloride 0.9 % 100 mL IVPB (has no administration in time range)  sodium chloride 0.9 % bolus 500 mL (has no administration in time range)     IMPRESSION / MDM / ASSESSMENT AND PLAN / ED COURSE  I reviewed the triage vital signs and the nursing notes.                             66 year old male presenting with generalized weakness, nausea/vomiting x2 days. Differential diagnosis includes, but is not limited to, acute appendicitis, renal colic, testicular torsion, urinary tract infection/pyelonephritis, prostatitis,  epididymitis, diverticulitis, small bowel obstruction or ileus, colitis, abdominal aortic aneurysm, gastroenteritis, hernia, etc. I have personally reviewed patient's records but am unable to see patient's records because he goes to the [place]; looks like he had a office visit on 08/09/2021.  Patient's presentation is most consistent with acute presentation with potential threat to life or bodily function.  The patient is on the cardiac monitor to evaluate for evidence of arrhythmia and/or significant heart rate changes.  Laboratory results demonstrate normal WBC 10.6, stable anemia H/H 7/23 (compared to 7/25 5 months ago); increased renal function creatinine 4.31 increased from 3.76 5 months ago; mild hyperkalemia potassium 5.7 without T wave changes.  We will add COVID swab, chest x-ray, CT abdomen/pelvis.  Initiate IV fluid hydration.  Anticipate hospitalization.  Clinical Course as of 09/02/21 0324  Sat Sep 02, 2021  0323 Updated patient on chest x-ray and CT scan suspicious for aspiration.  We will add blood cultures, lactic acid and procalcitonin.  We  will start IV Unasyn.  Will consult hospitalist services for evaluation and admission. [JS]  Clinical Course User Index [JS] Paulette Blanch, MD     FINAL CLINICAL IMPRESSION(S) / ED DIAGNOSES   Final diagnoses:  Generalized weakness  Nausea and vomiting, unspecified vomiting type  Dehydration  Anemia, unspecified type  Aspiration pneumonia, unspecified aspiration pneumonia type, unspecified laterality, unspecified part of lung ([place])     Rx / DC Orders   ED Discharge Orders     None        Note:  This document was prepared using Dragon voice recognition software and may include unintentional dictation errors.   Paulette Blanch, MD 09/02/21 [id number]

## 2021-09-02 NOTE — H&P (Signed)
History and Physical    Jared Gregory JME:[id number] DOB: 09/03/1955 DOA: 09/02/2021  Referring MD/NP/PA:   [facility]P: [facility] [place]   Patient coming from:  The patient is coming from SNF      Chief Complaint: nausea vomiting and cough  HPI: Jared Gregory is a 66 y.o. male with medical history significant of s/p of kidney transplantation on immunosuppressants, HTN, HLD, DM, depression with anxiety, atrial fibrillation on Eliquis, BPH, anemia, CKD-4,  MGUS, who presents with nausea vomiting and cough.  Patient states that he has nausea vomiting for 2 days.  He has several episodes of nonbilious nonbloody vomiting each day.  He developed dry cough and mild shortness breath today.  No chest pain.  No fever or chills.  Patient is now late using oxygen only at night as needed.  He was found to have oxygen desaturation to 91% on room air, which improved to 95% on 2 L oxygen.  Patient does not have symptoms of UTI.  No unilateral numbness or tinglings extremities.  Data reviewed independently and ED Course: pt was found to have WBC 10.6, lactic acid 0.7, 0.6, troponin level 15, liver function normal, lipase 22, negative COVID PCR, potassium 5.7, worsening renal function with creatinine 4.31, BUN 60, GFR 15 (recent baseline creatinine 3.76), positive urinalysis (hazy appearance, large amount of leukocyte, many bacteria, WBC> 50). temperature normal, blood pressure 160/85, heart rate 89, RR 20.  Patient is admitted to telemetry bed as inpatient.  Dr. Theador Hawthorne of renal was consulted.   EKG: I have personally reviewed.  Sinus rhythm, QTc 442, LAE, LAD, poor R wave progression, no T wave peaking.   CXR: Cardiomegaly, perihilar vascular distension and basilar interstitial edema. Findings consistent with CHF or fluid overload. 2. Small pleural effusions, with opacities in the left mid and lower lung field and right infrahilar area which most likely reflect superimposed multifocal pneumonia, less  likely alveolar edema. 3. Clinical correlation and radiographic follow-up recommended.  CT-abd/pelvis: Bilateral pleural effusions right greater than left with associated atelectatic changes and findings suspicious for aspiration.   Scattered colonic fecal material without evidence of obstruction.   Changes of anasarca.   Right pelvic transplant kidney with fullness of the collecting system and ureter although no obstructing lesion is noted.  Renal US: 1. The transplanted kidney in the right lower quadrant/right pelvis demonstrates moderate pelvicaliectasis/hydronephrosis, increased since March 14, 2021 ultrasound imaging. 2. The right kidney is a trophic consistent with history. The left kidney could not be visualized. 3. Mild dependent debris in the bladder.   Review of Systems:   General: no fevers, chills, no body weight gain, has fatigue HEENT: no blurry vision, hearing changes or sore throat Respiratory: has dyspnea, coughing, no wheezing CV: no chest pain, no palpitations GI: has nausea, vomiting, no abdominal pain, diarrhea, constipation GU: no dysuria, burning on urination, increased urinary frequency, hematuria  Ext: no leg edema Neuro: no unilateral weakness, numbness, or tingling, no vision change or hearing loss Skin: no rash, no skin tear. MSK: No muscle spasm, no deformity, no limitation of range of movement in spin Heme: No easy bruising.  Travel history: No recent long distant travel.   Allergy:  Allergies  Allergen Reactions   Baclofen    Lisinopril    Remeron [Mirtazapine]    Vancomycin    Zofran [Ondansetron]     Past Medical History:  Diagnosis Date   Atrial fibrillation (HCC)    Diabetes ([place])    HTN (hypertension)  Hyperlipidemia    Renal transplant recipient    Vascular dementia [facility])     Past Surgical History:  Procedure Laterality Date   AVF placement on left arm     KIDNEY TRANSPLANT      Social History:  reports that he has  never smoked. He has never used smokeless tobacco. No history on file for alcohol use and drug use.  Family History:  Family History  Problem Relation Age of Onset   Heart attack Mother    Schizophrenia Brother      Prior to Admission medications   Medication Sig Start Date End Date Taking? Authorizing Provider  amLODipine (NORVASC) 10 MG tablet Take 10 mg by mouth daily. 07/29/20  Yes [provider]  ascorbic acid (VITAMIN C) 500 MG tablet Take 500 mg by mouth daily.   Yes [provider]  atorvastatin (LIPITOR) 10 MG tablet Take 10 mg by mouth daily. 01/19/21  Yes [provider]  carvedilol (COREG) 25 MG tablet Take 25 mg by mouth 2 (two) times daily with a meal.   Yes [provider]  Cholecalciferol (VITAMIN D3) 50 MCG (2000 UT) TABS Take 1 tablet by mouth in the morning and at bedtime. 07/29/20  Yes [provider]  cycloSPORINE (SANDIMMUNE) 25 MG capsule Take 3 capsules by mouth 2 (two) times daily. 07/29/20  Yes [provider]  diclofenac Sodium (VOLTAREN) 1 % GEL Apply 2 g topically 4 (four) times daily.   Yes [provider]  diphenhydrAMINE (BENADRYL) 25 MG tablet Take 25 mg by mouth every 6 (six) hours as needed.   Yes [provider]  DULoxetine (CYMBALTA) 60 MG capsule Take 60 mg by mouth daily. 07/29/20  Yes [provider]  ELIQUIS 5 MG TABS tablet Take 5 mg by mouth 2 (two) times daily. 08/30/20  Yes [provider]  ferrous sulfate 325 (65 FE) MG tablet Take 325 mg by mouth daily with breakfast.   Yes [provider]  finasteride (PROSCAR) 5 MG tablet Take 5 mg by mouth daily. 07/29/20  Yes [provider]  fluticasone furoate-vilanterol (BREO ELLIPTA) 200-25 MCG/ACT AEPB Inhale 1 puff into the lungs daily. 01/20/21  Yes Regalado, Belkys A, MD  folic acid (FOLVITE) 1 MG tablet Take 1 tablet (1 mg total) by mouth daily. 01/20/21  Yes Regalado, Belkys A, MD  hydrALAZINE  (APRESOLINE) 50 MG tablet Take 75 mg by mouth 3 (three) times daily.   Yes [provider]  lacosamide 150 MG TABS Take 1 tablet (150 mg total) by mouth 2 (two) times daily. 01/19/21 09/02/21 Yes Regalado, Belkys A, MD  linagliptin (TRADJENTA) 5 MG TABS tablet Take 1 tablet (5 mg total) by mouth daily. 01/20/21  Yes Regalado, Belkys A, MD  mycophenolate (MYFORTIC) 180 MG EC tablet Take 180 mg by mouth 2 (two) times daily. 08/01/20  Yes [provider]  polyethylene glycol (MIRALAX / GLYCOLAX) 17 g packet Take 17 g by mouth daily. 01/20/21  Yes Regalado, Belkys A, MD  sodium bicarbonate 650 MG tablet Take 1 tablet by mouth 3 (three) times daily. 08/01/20  Yes [provider]  tamsulosin (FLOMAX) 0.4 MG CAPS capsule Take 0.8 mg by mouth daily. 07/29/20  Yes [provider]  acetaminophen (TYLENOL) 325 MG tablet Take 2 tablets by mouth every 6 (six) hours as needed.    [provider]  cloNIDine (CATAPRES) 0.1 MG tablet Take 1 tablet by mouth every 4 (four) hours as needed. Patient not  taking: Reported on 09/02/2021 03/06/21   [provider]  labetalol (NORMODYNE) 200 MG tablet Take 1 tablet (200 mg total) by mouth 3 (three) times daily. Patient not taking: Reported on 09/02/2021 01/19/21   Regalado, Jerald Kief A, MD  LORazepam (ATIVAN) 1 MG tablet Take 1 mg by mouth 2 (two) times daily. Patient not taking: Reported on 09/02/2021 03/08/21   [provider]  torsemide (DEMADEX) 20 MG tablet Take 1 tablet by mouth daily. 02/21/21   [provider]    Physical Exam: Vitals:   09/02/21 0300 09/02/21 0529 09/02/21 0940 09/02/21 1658  BP: (!) 161/92 (!) 159/91 (!) 160/85 131/74  Pulse: 71 71 81 71  Resp: '17 20 16 16  '$ Temp:  97.8 F (36.6 C) 98.2 F (36.8 C) 98 F (36.7 C)  TempSrc:  Oral    SpO2: 94% 95% 100% 97%  Weight:      Height:       General: Not in acute distress HEENT:       Eyes: PERRL, EOMI, no scleral icterus.       ENT: No  discharge from the ears and nose, no pharynx injection, no tonsillar enlargement.        Neck: No JVD, no bruit, no mass felt. Heme: No neck lymph node enlargement. Cardiac: S1/S2, RRR, No murmurs, No gallops or rubs. Respiratory: No rales, wheezing, rhonchi or rubs. GI: Soft, nondistended, nontender, no rebound pain, no organomegaly, BS present. GU: No hematuria Ext: No pitting leg edema bilaterally. 1+DP/PT pulse bilaterally. Musculoskeletal: No joint deformities, No joint redness or warmth, no limitation of ROM in spin. Skin: No rashes.  Neuro: Alert, oriented X3, cranial nerves II-XII grossly intact, moves all extremities normally.  Psych: Patient is not psychotic, no suicidal or hemocidal ideation.  Labs on Admission: I have personally reviewed following labs and imaging studies  CBC: Recent Labs  Lab 09/01/21 1952  WBC 10.6*  HGB 7.1*  HCT 23.9*  MCV 77.9*  PLT 782   Basic Metabolic Panel: Recent Labs  Lab 09/01/21 1952  NA 141  K 5.7*  CL 108  CO2 23  GLUCOSE 154*  BUN 60*  CREATININE 4.31*  CALCIUM 8.7*   GFR: Estimated Creatinine Clearance: 16.4 mL/min (A) (by C-G formula based on SCr of 4.31 mg/dL (H)). Liver Function Tests: Recent Labs  Lab 09/01/21 1952  AST 18  ALT 9  ALKPHOS 46  BILITOT 0.9  PROT 7.3  ALBUMIN 3.2*   Recent Labs  Lab 09/01/21 1952  LIPASE 22   No results for input(s): "AMMONIA" in the last 168 hours. Coagulation Profile: No results for input(s): "INR", "PROTIME" in the last 168 hours. Cardiac Enzymes: No results for input(s): "CKTOTAL", "CKMB", "CKMBINDEX", "TROPONINI" in the last 168 hours. BNP (last 3 results) No results for input(s): "PROBNP" in the last 8760 hours. HbA1C: No results for input(s): "HGBA1C" in the last 72 hours. CBG: Recent Labs  Lab 09/01/21 1948 09/02/21 1229  GLUCAP 149* 91   Lipid Profile: No results for input(s): "CHOL", "HDL", "LDLCALC", "TRIG", "CHOLHDL", "LDLDIRECT" in the last 72  hours. Thyroid Function Tests: No results for input(s): "TSH", "T4TOTAL", "FREET4", "T3FREE", "THYROIDAB" in the last 72 hours. Anemia Panel: No results for input(s): "VITAMINB12", "FOLATE", "FERRITIN", "TIBC", "IRON", "RETICCTPCT" in the last 72 hours. Urine analysis:    Component Value Date/Time   COLORURINE YELLOW (A) 09/01/2021 1952   APPEARANCEUR HAZY (A) 09/01/2021 1952   LABSPEC 1.011 09/01/2021 1952   PHURINE 7.0 09/01/2021 1952  GLUCOSEU NEGATIVE 09/01/2021 1952   HGBUR NEGATIVE 09/01/2021 1952   BILIRUBINUR NEGATIVE 09/01/2021 1952   KETONESUR NEGATIVE 09/01/2021 1952   PROTEINUR 100 (A) 09/01/2021 1952   NITRITE NEGATIVE 09/01/2021 1952   LEUKOCYTESUR LARGE (A) 09/01/2021 1952   Sepsis Labs: '@LABRCNTIP'$ (procalcitonin:4,lacticidven:4) ) Recent Results (from the past 240 hour(s))  SARS Coronavirus 2 by RT PCR (hospital order, performed in [facility] hospital lab) *cepheid single result test* Anterior Nasal Swab     Status: None   Collection Time: 09/02/21  3:04 AM   Specimen: Anterior Nasal Swab  Result Value Ref Range Status   SARS Coronavirus 2 by RT PCR NEGATIVE NEGATIVE Final    Comment: (NOTE) SARS-CoV-2 target nucleic acids are NOT DETECTED.  The SARS-CoV-2 RNA is generally detectable in upper and lower respiratory specimens during the acute phase of infection. The lowest concentration of SARS-CoV-2 viral copies this assay can detect is 250 copies / mL. A negative result does not preclude SARS-CoV-2 infection and should not be used as the sole basis for treatment or other patient management decisions.  A negative result may occur with improper specimen collection / handling, submission of specimen other than nasopharyngeal swab, presence of viral mutation(s) within the areas targeted by this assay, and inadequate number of viral copies (<250 copies / mL). A negative result must be combined with clinical observations, patient history, and epidemiological  information.  Fact Sheet for Patients:   https://www.patel.info/  Fact Sheet for Healthcare Providers: https://hall.com/  This test is not yet approved or  cleared by the [place] FDA and has been authorized for detection and/or diagnosis of SARS-CoV-2 by FDA under an Emergency Use Authorization (EUA).  This EUA will remain in effect (meaning this test can be used) for the duration of the COVID-19 declaration under Section 564(b)(1) of the Act, 21 U.S.C. section 360bbb-3(b)(1), unless the authorization is terminated or revoked sooner.  Performed at [facility], [address] [place], [place] [address]   Culture, blood (routine x 2)     Status: None (Preliminary result)   Collection Time: 09/02/21  4:58 AM   Specimen: BLOOD  Result Value Ref Range Status   Specimen Description BLOOD RIGHT [facility]  Final   Special Requests   Final    BOTTLES DRAWN AEROBIC AND ANAEROBIC Blood Culture results may not be optimal due to an excessive volume of blood received in culture bottles   Culture   Final    NO GROWTH <12 HOURS Performed at [facility], [address] [place], [place] [address]    Report Status PENDING  Incomplete     Radiological Exams on Admission: US RENAL  Result Date: 09/02/2021 CLINICAL DATA:  Renal transplant.  ATN. EXAM: RENAL / URINARY TRACT ULTRASOUND COMPLETE COMPARISON:  CT scan of the abdomen and pelvis September 02, 2021. Renal ultrasound March 14, 2021. FINDINGS: Right Kidney: Renal measurements: 7.9 x 3.1 x 4.2 cm = volume: 53 mL. Marked increased cortical echogenicity. Left Kidney: Not visualized. Bladder: Debris in the bladder. Right Transplanted kidney: Renal measurements: 11.6 x 6.1 x 6.4 cm = volume: 230 a mL. Moderate pelvicaliectasis/hydronephrosis, increased since March 14, 2021. Other: None. IMPRESSION: 1. The transplanted kidney in the right lower quadrant/right pelvis demonstrates moderate  pelvicaliectasis/hydronephrosis, increased since March 14, 2021 ultrasound imaging. 2. The right kidney is a trophic consistent with history. The left kidney could not be visualized. 3. Mild dependent debris in the bladder. Electronically Signed   By: Dorise Bullion III  M.D.   On: 09/02/2021 14:43   DG Chest Port 1 View  Result Date: 09/02/2021 CLINICAL DATA:  Vomiting and hypoxia. EXAM: PORTABLE CHEST 1 VIEW COMPARISON:  Chest CT 01/13/2021 cough portable chest 01/10/2022. FINDINGS: There is mild-to-moderate cardiomegaly. There is increased central vascular distension and flow cephalization and increased interstitial edema in the base of both lungs with small pleural effusions. There are patchy opacities in the left mid perihilar and lower lung fields, hazy interstitial consolidation in the right infrahilar area, findings most likely indicating superimposed multifocal pneumonia. Underlying edema component of airspace disease not excluded. The remaining lungs are generally clear. There is slight aortic tortuosity with stable mediastinum. No acute osseous abnormality. IMPRESSION: 1. Cardiomegaly, perihilar vascular distension and basilar interstitial edema. Findings consistent with CHF or fluid overload. 2. Small pleural effusions, with opacities in the left mid and lower lung field and right infrahilar area which most likely reflect superimposed multifocal pneumonia, less likely alveolar edema. 3. Clinical correlation and radiographic follow-up recommended. Electronically Signed   By: Telford Nab M.D.   On: 09/02/2021 02:52   CT Abdomen Pelvis Wo Contrast  Result Date: 09/02/2021 CLINICAL DATA:  Nausea and vomiting for 2 days EXAM: CT ABDOMEN AND PELVIS WITHOUT CONTRAST TECHNIQUE: Multidetector CT imaging of the abdomen and pelvis was performed following the standard protocol without IV contrast. RADIATION DOSE REDUCTION: This exam was performed according to the departmental dose-optimization program  which includes automated exposure control, adjustment of the mA and/or kV according to patient size and/or use of iterative reconstruction technique. COMPARISON:  None Available. FINDINGS: Lower chest: Lung bases demonstrate small bilateral pleural effusions right greater than left with associated atelectasis in the right base and patchy atelectatic changes in the left base. These changes suggest possible aspiration given some hyperdense material within the substance of the right lower lobe. Hepatobiliary: No focal liver abnormality is seen. No gallstones, gallbladder wall thickening, or biliary dilatation. Pancreas: Unremarkable. No pancreatic ductal dilatation or surrounding inflammatory changes. Spleen: Normal in size without focal abnormality. Adrenals/Urinary Tract: Adrenal glands are within normal limits. Heavy renal vascular calcifications are noted. No focal mass is seen. Small cysts are again identified in the kidneys bilaterally. No follow-up is recommended. No obstructive changes are seen. The bladder is well distended. A transplant kidney is noted in the right hemipelvis. Mild fullness of the transplant renal pelvis and ureter is noted although no obstructing lesion is seen. Stomach/Bowel: Scattered fecal material is noted throughout the colon without obstructive change. The appendix is not discretely visualized although no inflammatory changes to suggest appendicitis are noted. Small bowel and stomach appear within normal limits. Vascular/Lymphatic: Aortic atherosclerosis. No enlarged abdominal or pelvic lymph nodes. Reproductive: Prostate is unremarkable. Other: Mild changes of anasarca are noted.  No ascites is seen. Musculoskeletal: No acute or significant osseous findings. IMPRESSION: Bilateral pleural effusions right greater than left with associated atelectatic changes and findings suspicious for aspiration. Scattered colonic fecal material without evidence of obstruction. Changes of anasarca.  Right pelvic transplant kidney with fullness of the collecting system and ureter although no obstructing lesion is noted. Electronically Signed   By: Inez Catalina M.D.   On: 09/02/2021 02:47      Assessment/Plan Principal Problem:   Aspiration pneumonia (HCC) Active Problems:   Hyperkalemia   CKD (chronic kidney disease), stage IV (HCC)   Renal transplant recipient   Essential hypertension   Seizure-like activity ([place])   HLD (hyperlipidemia)   Type II diabetes mellitus with renal manifestations ([place])  BPH (benign prostatic hyperplasia)   Nausea & vomiting   Anemia in chronic kidney disease (CKD)   Depression   Chronic a-fib (HCC)   UTI (urinary tract infection)   Assessment and Plan: * Aspiration pneumonia ([place]) Patient has 2 L oxygen requirement, does not meet criteria for sepsis  - Admit to tele bed as inpt - Unasyn (patient received 1 dose of Rocephin azithromycin in ED) - Mucinex for cough  -Bronchodilators - Urine legionella and S. pneumococcal antigen - Follow up blood culture x2, sputum culture   Hyperkalemia Potassium 5.7 -Treated with D50, 8 units of NovoLog, 10 g of leukoma  CKD (chronic kidney disease), stage IV (HCC) Worsening baseline.  Recent baseline creatinine 3.76, BUN 4.31 -Consulted Dr.  Theador Hawthorne of nephrology -Hold torsemide  Renal transplant recipient Patient is taking mycophenolate and cyclosporine.  US-renal showed transplanted kidney with moderate pelvicaliectasis/hydronephrosis, increased since March 14, 2021 ultrasound imaging. Bladder scan showed 200 retention. No obstructive stone. UA is positive for UTI  -Continue home mycophenolate and cyclosporine -Check cyclosporine level -will place Foley cath -pt is on unasyn (received 1 dose of Rocephin earlier in ED)    Essential hypertension - IV hydralazine as needed -Amlodipine, Coreg, hydralazine -Hold torsemide due to worsening renal function  Seizure-like activity (HCC) - Seizure  precaution -As needed Ativan for seizure -Continue home lacosamide  HLD (hyperlipidemia) - Lipitor  Type II diabetes mellitus with renal manifestations (HCC) Recent A1c 5.2, well controlled.  Patient is taking Tradjenta at home -Sliding scale insulin  BPH (benign prostatic hyperplasia) - Flomax and Proscar  Nausea & vomiting Possibly due to UTI.  -As needed Zofran  Anemia in chronic kidney disease (CKD) Hemoglobin 7.1 (7.9 on 03/14/2021) -Continue iron supplement  Depression - Continue home medications  UTI (urinary tract infection) -on IV Unasyn (received 1 dose of Rocephin in ED) -Follow-up urine culture  Chronic a-fib (HCC) Heart rate 89 -Continue Eliquis, Coreg          DVT ppx: on Eliquis  Code Status: Full code  Family Communication: Yes, patient's sister   by phone  Disposition Plan:  Anticipate discharge back to previous environment, SNF  Consults called:  Dr. Theador Hawthorne of renal was consulted.  Admission status and Level of care: Telemetry Medical:     as inpt        Dispo: The patient is from: SNF              Anticipated d/c is to: SNF              Anticipated d/c date is: 2 days              Patient currently is not medically stable to d/c.    Severity of Illness:  The appropriate patient status for this patient is INPATIENT. Inpatient status is judged to be reasonable and necessary in order to provide the required intensity of service to ensure the patient's safety. The patient's presenting symptoms, physical exam findings, and initial radiographic and laboratory data in the context of their chronic comorbidities is felt to place them at high risk for further clinical deterioration. Furthermore, it is not anticipated that the patient will be medically stable for discharge from the hospital within 2 midnights of admission.   * I certify that at the point of admission it is my clinical judgment that the patient will require inpatient hospital  care spanning beyond 2 midnights from the point of admission due to high intensity of service, high  risk for further deterioration and high frequency of surveillance required.*       Date of Service 09/02/2021    Ivor Costa [facility]   If 7PM-7AM, please contact night-coverage [email or web address] 09/02/2021, 6:37 PM

## 2021-09-02 NOTE — Assessment & Plan Note (Signed)
Recent A1c 5.2, well controlled.  Patient is taking Tradjenta at home -Sliding scale insulin

## 2021-09-02 NOTE — ED Notes (Signed)
Blood draw and meds delayed d/t to access issues. IV team consult put in. IP MD Mansy notified.

## 2021-09-02 NOTE — Assessment & Plan Note (Signed)
Patient has 2 L oxygen requirement, does not meet criteria for sepsis  - Admit to tele bed as inpt - Unasyn (patient received 1 dose of Rocephin azithromycin in ED) - Mucinex for cough  -Bronchodilators - Urine legionella and S. pneumococcal antigen - Follow up blood culture x2, sputum culture

## 2021-09-02 NOTE — Assessment & Plan Note (Signed)
-   Seizure precaution -As needed Ativan for seizure -Continue home lacosamide

## 2021-09-02 NOTE — Assessment & Plan Note (Signed)
Potassium 5.7 -Treated with D50, 8 units of NovoLog, 10 g of leukoma

## 2021-09-02 NOTE — Assessment & Plan Note (Addendum)
Worsening baseline.  Recent baseline creatinine 3.76, BUN 4.31 -Consulted Dr.  Theador Hawthorne of nephrology -Hold torsemide

## 2021-09-02 NOTE — Assessment & Plan Note (Signed)
-  on IV Unasyn (received 1 dose of Rocephin in ED) -Follow-up urine culture

## 2021-09-02 NOTE — ED Notes (Signed)
IV team in room at this time.  

## 2021-09-02 NOTE — Assessment & Plan Note (Signed)
-   IV hydralazine as needed -Amlodipine, Coreg, hydralazine -Hold torsemide due to worsening renal function

## 2021-09-02 NOTE — Assessment & Plan Note (Signed)
Possibly due to UTI.  -As needed Zofran

## 2021-09-02 NOTE — Assessment & Plan Note (Signed)
-   Flomax and Proscar

## 2021-09-02 NOTE — Assessment & Plan Note (Signed)
Lipitor 

## 2021-09-02 NOTE — Progress Notes (Signed)
Tubed patient's MAR to pharmacy per [facility] request.

## 2021-09-02 NOTE — Assessment & Plan Note (Addendum)
Patient is taking mycophenolate and cyclosporine.  US-renal showed transplanted kidney with moderate pelvicaliectasis/hydronephrosis, increased since March 14, 2021 ultrasound imaging. Bladder scan showed 200 retention. No obstructive stone. UA is positive for UTI  -Continue home mycophenolate and cyclosporine -Check cyclosporine level -will place Foley cath -pt is on unasyn (received 1 dose of Rocephin earlier in ED)

## 2021-09-02 NOTE — Consult Note (Signed)
Jared Gregory MRN: [id number] DOB/AGE: [birth year] 65 y.o. Primary Care Physician:[facility] [place] date: 09/02/2021 Chief Complaint:  Chief Complaint  Patient presents with   Weakness   HPI:   Patient is a 66 year old male with a past medical history of end-stage renal disease, s/p renal transplant, history of multiple myeloma, hypertension, hyperlipidemia, diabetes mellitus type 2, secondary hyperparathyroidism, seizures, coronary artery disease who was brought to the ER with chief complaint of weakness.  History of present illness date backs to around 2 days ago when the staff at [facility] noticed that patient was having nausea and vomiting.  Patient was sent to the ER. Upon evaluation in the ER patient was found to have a hemoglobin of 7.1, patient's WBC count was elevated at 10.6 patient CT abdomen showed bilateral pleural effusion with finding suspicious for aspiration Patient was admitted for aspiration pneumonia Patient goes to the [place] for his regular care. Nephrology was consulted for comanagement of renal transplant patient Patient main concern during the visit was that he had nausea and vomiting a day or 2 before this admission   Patient offers no complaint of chest pain No complaint of fever No complaint of chills No complaint of hematuria No complaint of change in speech or change in vision No complaint of abdominal pain   Past Medical History:  Diagnosis Date   Atrial fibrillation ([place])    Diabetes ([place])    HTN (hypertension)    Hyperlipidemia    Renal transplant recipient    Vascular dementia ([place])        Family history-patient does not give any family history of ESRD No family history on file.  Social History:  reports that he has never smoked. He has never used smokeless tobacco. No history on file for alcohol use and drug use.   Allergies:  Allergies  Allergen Reactions   Baclofen    Lisinopril    Remeron [Mirtazapine]     Vancomycin    Zofran [Ondansetron]     Medications Prior to Admission  Medication Sig Dispense Refill   amLODipine (NORVASC) 10 MG tablet Take 10 mg by mouth daily.     ascorbic acid (VITAMIN C) 500 MG tablet Take 500 mg by mouth daily.     atorvastatin (LIPITOR) 10 MG tablet Take 10 mg by mouth daily.     carvedilol (COREG) 25 MG tablet Take 25 mg by mouth 2 (two) times daily with a meal.     Cholecalciferol (VITAMIN D3) 50 MCG (2000 UT) TABS Take 1 tablet by mouth in the morning and at bedtime.     cycloSPORINE (SANDIMMUNE) 25 MG capsule Take 3 capsules by mouth 2 (two) times daily.     diclofenac Sodium (VOLTAREN) 1 % GEL Apply 2 g topically 4 (four) times daily.     diphenhydrAMINE (BENADRYL) 25 MG tablet Take 25 mg by mouth every 6 (six) hours as needed.     DULoxetine (CYMBALTA) 60 MG capsule Take 60 mg by mouth daily.     ELIQUIS 5 MG TABS tablet Take 5 mg by mouth 2 (two) times daily.     ferrous sulfate 325 (65 FE) MG tablet Take 325 mg by mouth daily with breakfast.     finasteride (PROSCAR) 5 MG tablet Take 5 mg by mouth daily.     fluticasone furoate-vilanterol (BREO ELLIPTA) 200-25 MCG/ACT AEPB Inhale 1 puff into the lungs daily. 1 each 0   folic acid (FOLVITE) 1 MG tablet Take 1 tablet (1  mg total) by mouth daily. 30 tablet 0   hydrALAZINE (APRESOLINE) 50 MG tablet Take 75 mg by mouth 3 (three) times daily.     lacosamide 150 MG TABS Take 1 tablet (150 mg total) by mouth 2 (two) times daily. 60 tablet 0   linagliptin (TRADJENTA) 5 MG TABS tablet Take 1 tablet (5 mg total) by mouth daily. 30 tablet 0   mycophenolate (MYFORTIC) 180 MG EC tablet Take 180 mg by mouth 2 (two) times daily.     polyethylene glycol (MIRALAX / GLYCOLAX) 17 g packet Take 17 g by mouth daily. 14 each 0   sodium bicarbonate 650 MG tablet Take 1 tablet by mouth 3 (three) times daily.     tamsulosin (FLOMAX) 0.4 MG CAPS capsule Take 0.8 mg by mouth daily.     acetaminophen (TYLENOL) 325 MG tablet Take 2  tablets by mouth every 6 (six) hours as needed.     cloNIDine (CATAPRES) 0.1 MG tablet Take 1 tablet by mouth every 4 (four) hours as needed. (Patient not taking: Reported on 09/02/2021)     labetalol (NORMODYNE) 200 MG tablet Take 1 tablet (200 mg total) by mouth 3 (three) times daily. (Patient not taking: Reported on 09/02/2021) 90 tablet 0   LORazepam (ATIVAN) 1 MG tablet Take 1 mg by mouth 2 (two) times daily. (Patient not taking: Reported on 09/02/2021)     torsemide (DEMADEX) 20 MG tablet Take 1 tablet by mouth daily.         YBW:LSLHT from the symptoms mentioned above,there are no other symptoms referable to all systems reviewed.   amLODipine  10 mg Oral Daily   apixaban  5 mg Oral BID   ascorbic acid  500 mg Oral Daily   atorvastatin  10 mg Oral Daily   carvedilol  25 mg Oral BID WC   cholecalciferol  2,000 Units Oral Q1400   cycloSPORINE  75 mg Oral BID   dextrose  50 mL Intravenous Once   DULoxetine  60 mg Oral Daily   [START ON 09/03/2021] ferrous sulfate  325 mg Oral Q breakfast   finasteride  5 mg Oral Daily   fluticasone furoate-vilanterol  1 puff Inhalation Daily   folic acid  1 mg Oral Daily   guaiFENesin  600 mg Oral BID   hydrALAZINE  75 mg Oral TID   insulin aspart  8 Units Intravenous Once   ipratropium-albuterol  3 mL Nebulization QID   lacosamide  150 mg Oral BID   mycophenolate  180 mg Oral BID   polyethylene glycol  17 g Oral Daily   sodium bicarbonate  650 mg Oral TID   sodium zirconium cyclosilicate  10 g Oral Once   tamsulosin  0.8 mg Oral Daily       Physical Exam: Vital signs in last 24 hours: Temp:  [97.8 F (36.6 C)-98.2 F (36.8 C)] 98.2 F (36.8 C) (08/26 0940) Pulse Rate:  [71-89] 81 (08/26 0940) Resp:  [16-20] 16 (08/26 0940) BP: (152-161)/(84-92) 160/85 (08/26 0940) SpO2:  [94 %-100 %] 100 % (08/26 0940) Weight:  [67.1 kg] 67.1 kg (08/25 1936) Weight change:  Last BM Date : 09/01/21 (pt stated)  Intake/Output from previous day: No  intake/output data recorded. No intake/output data recorded.   Physical Exam: General- pt is awake,alert, oriented to time place and person Resp- No acute REsp distress, Rhonchi present, decreased BS at bases CVS- S1S2 regular in rate and rhythm GIT- BS+, soft, NT, ND EXT- NO LE  Edema,no  Cyanosis Left upper extremity edema more than the right CNS- CN 2-12 grossly intact. Moving all 4 extremities Psych- normal mood and affect Access- none   Lab Results: CBC Recent Labs    09/01/21 1952  WBC 10.6*  HGB 7.1*  HCT 23.9*  PLT 169    BMET Recent Labs    09/01/21 1952  NA 141  K 5.7*  CL 108  CO2 23  GLUCOSE 154*  BUN 60*  CREATININE 4.31*  CALCIUM 8.7*    MICRO Recent Results (from the past 240 hour(s))  SARS Coronavirus 2 by RT PCR (hospital order, performed in [facility] hospital lab) *cepheid single result test* Anterior Nasal Swab     Status: None   Collection Time: 09/02/21  3:04 AM   Specimen: Anterior Nasal Swab  Result Value Ref Range Status   SARS Coronavirus 2 by RT PCR NEGATIVE NEGATIVE Final    Comment: (NOTE) SARS-CoV-2 target nucleic acids are NOT DETECTED.  The SARS-CoV-2 RNA is generally detectable in upper and lower respiratory specimens during the acute phase of infection. The lowest concentration of SARS-CoV-2 viral copies this assay can detect is 250 copies / mL. A negative result does not preclude SARS-CoV-2 infection and should not be used as the sole basis for treatment or other patient management decisions.  A negative result may occur with improper specimen collection / handling, submission of specimen other than nasopharyngeal swab, presence of viral mutation(s) within the areas targeted by this assay, and inadequate number of viral copies (<250 copies / mL). A negative result must be combined with clinical observations, patient history, and epidemiological information.  Fact Sheet for Patients:    https://www.patel.info/  Fact Sheet for Healthcare Providers: https://hall.com/  This test is not yet approved or  cleared by the [place] FDA and has been authorized for detection and/or diagnosis of SARS-CoV-2 by FDA under an Emergency Use Authorization (EUA).  This EUA will remain in effect (meaning this test can be used) for the duration of the COVID-19 declaration under Section 564(b)(1) of the Act, 21 U.S.C. section 360bbb-3(b)(1), unless the authorization is terminated or revoked sooner.  Performed at [facility], [address] [place], [place] [address]   Culture, blood (routine x 2)     Status: None (Preliminary result)   Collection Time: 09/02/21  4:58 AM   Specimen: BLOOD  Result Value Ref Range Status   Specimen Description BLOOD RIGHT Dr [facility]  Final   Special Requests   Final    BOTTLES DRAWN AEROBIC AND ANAEROBIC Blood Culture results may not be optimal due to an excessive volume of blood received in culture bottles   Culture   Final    NO GROWTH <12 HOURS Performed at [facility], [address] [place], [place] [address]    Report Status PENDING  Incomplete      Lab Results  Component Value Date   CALCIUM 8.7 (L) 09/01/2021   PHOS 3.4 01/18/2021    CT scan  IMPRESSION: Bilateral pleural effusions right greater than left with associated atelectatic changes and findings suspicious for aspiration.   Scattered colonic fecal material without evidence of obstruction.   Changes of anasarca.   Right pelvic transplant kidney with fullness of the collecting system and ureter although no obstructing lesion is noted.  RFenal U/s  EXAM: RENAL / URINARY TRACT ULTRASOUND COMPLETE   COMPARISON:  CT scan of the abdomen and pelvis September 02, 2021. Renal ultrasound March 14, 2021.  FINDINGS: Right Kidney:   Renal measurements: 7.9 x 3.1 x 4.2 cm = volume: 53 mL. Marked increased cortical  echogenicity.   Left Kidney:   Not visualized.   Bladder:   Debris in the bladder.   Right Transplanted kidney:   Renal measurements: 11.6 x 6.1 x 6.4 cm = volume: 230 a mL. Moderate pelvicaliectasis/hydronephrosis, increased since March 14, 2021.   Other:   None.   IMPRESSION: 1. The transplanted kidney in the right lower quadrant/right pelvis demonstrates moderate pelvicaliectasis/hydronephrosis, increased since March 14, 2021 ultrasound imaging. 2. The right kidney is a trophic consistent with history. The left kidney could not be visualized. 3. Mild dependent debris in the bladder.    Impression:      1)Renal   AKI Patient baseline creatinine has been around 3.9 now patient creatinine is at 4.3 AKI versus CKD progression Possible contribution from obstructive issues as patient CT scan had shown that there is fullness in the collecting system We will ask for renal ultrasound Addendum patient renal ultrasound does show hydronephrosis/moderate pelviectasis which has worsened than before  CKD stage IV Patient has CKD stage IV most likely secondary to multiple factors Diabetes mellitus/hypertension/calcineurin nephropathy. Patient has history of s/p renal transplant   Patient lab data from care everywhere was reviewed Patient creatinine was 4.1 when done on August 09, 2021 Patient potassium was 5.7-high  Patient creatinine was 3.9 on August 02, 2021 Patient has history of ESRD secondary diabetes mellitus/hypertension Patient is s/p deceased donor kidney transplant received at [facility] on May 05, 2015 Patient was on hemodialysis from 2011 till May 05, 2015 Patient had delayed graft function requiring dialysis for hyperkalemia after his transplant Patient had a KDPI of 71% and PRA of 0% at the time of transplantation Patient was on prednisone sparing immunosuppression Patient was earlier on tacrolimus which was switched to cyclosporine on April, 2018 secondary  to neuropathy 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  Patient thereafter developed nephrotic range proteinuria on June 2022.  Patient was thought to not be candidate for kidney biopsy because of severe underlying kidney disease Patient had has multiple episodes of AKI.  Patient had AKI in March of  23, then again in April 2023.  Patient has been deemed CKD stage IV going back to July 2023.   Immunosuppression Patient is on cyclosporine 75 mg p.o. twice daily Mycophenolate sodium 180 mg p.o. twice daily Patient is on steroid sparing immunosuppression  Creatinine trend 2023 3.9--4.1, now 4.3 2022 2.4--2.7AKI with a peak creatinine of 3.1 2021 2.0--2.7-AKI 2021.9--2.8-AKI 2019 1.4--1.8-AKI Patient had renal transplant done in 2017 and had delayed graft function    2)HTN Blood pressure is stable   3)Anemia  Of chronic disease Patient is on Epogen through patient's hematologist oncologist at the [place] Disorder-Secondary hyperparathyroidism Patient has history of secondary hyperparathyroidism Patient intact PTH level was high at 100.9 on June 22, 2021   5)MGUS Patient is being followed by the hematology oncology at the [facility]   6)Electrolytes/hyperkalemia Patient has hyperkalemia We will start patient on [place]      7)Aspiration pneumonia Patient is antibiotics Primary team is following   8) Proteinuria Patient has history of nephrotic range proteinuria Patient is currently not on any RAS blockers secondary to his low GFR   9)Chronic metabolic acidosis Patient is on p.o. bicarb      Plan:  Will ask for cyclosporine levels We will continue patient on current immunosuppression We will ask  for renal ultrasound And postvoid residual We will start patient on [place] for hyperkalemia   Addendum 10)Hydronephrosis I reviewed patient's renal ultrasound. Patient renal ultrasound shows patient does have moderate pelvic disease  and hydronephrosis I discussed with the primary team about need for Foley catheter/UA   Brookelin Felber s [facility] 09/02/2021, 10:50 AM

## 2021-09-02 NOTE — Assessment & Plan Note (Signed)
Heart rate 89 -Continue Eliquis, Coreg

## 2021-09-03 ENCOUNTER — Inpatient Hospital Stay: Payer: No Typology Code available for payment source

## 2021-09-03 LAB — HEMOGLOBIN AND HEMATOCRIT, BLOOD
HCT: 20.3 % — ABNORMAL LOW (ref 39.0–52.0)
Hemoglobin: 6.2 g/dL — ABNORMAL LOW (ref 13.0–17.0)

## 2021-09-03 LAB — BASIC METABOLIC PANEL
Anion gap: 6 (ref 5–15)
BUN: 65 mg/dL — ABNORMAL HIGH (ref 8–23)
CO2: 25 mmol/L (ref 22–32)
Calcium: 8.4 mg/dL — ABNORMAL LOW (ref 8.9–10.3)
Chloride: 111 mmol/L (ref 98–111)
Creatinine, Ser: 4.48 mg/dL — ABNORMAL HIGH (ref 0.61–1.24)
GFR, Estimated: 14 mL/min — ABNORMAL LOW (ref >60)
Glucose, Bld: 98 mg/dL (ref 70–99)
Potassium: 4.8 mmol/L (ref 3.5–5.1)
Sodium: 142 mmol/L (ref 135–145)

## 2021-09-03 LAB — CBC
HCT: 19 % — ABNORMAL LOW (ref 39.0–52.0)
Hemoglobin: 5.8 g/dL — ABNORMAL LOW (ref 13.0–17.0)
MCH: 23.3 pg — ABNORMAL LOW (ref 26.0–34.0)
MCHC: 30.5 g/dL (ref 30.0–36.0)
MCV: 76.3 fL — ABNORMAL LOW (ref 80.0–100.0)
Platelets: 160 10*3/uL (ref 150–400)
RBC: 2.49 MIL/uL — ABNORMAL LOW (ref 4.22–5.81)
RDW: 21.2 % — ABNORMAL HIGH (ref 11.5–15.5)
WBC: 5.9 10*3/uL (ref 4.0–10.5)
nRBC: 0 % (ref 0.0–0.2)

## 2021-09-03 LAB — GLUCOSE, CAPILLARY
Glucose-Capillary: 118 mg/dL — ABNORMAL HIGH (ref 70–99)
Glucose-Capillary: 100 mg/dL — ABNORMAL HIGH (ref 70–99)
Glucose-Capillary: 114 mg/dL — ABNORMAL HIGH (ref 70–99)
Glucose-Capillary: 116 mg/dL — ABNORMAL HIGH (ref 70–99)
Glucose-Capillary: 124 mg/dL — ABNORMAL HIGH (ref 70–99)

## 2021-09-03 LAB — STREP PNEUMONIAE URINARY ANTIGEN: Strep Pneumo Urinary Antigen: NEGATIVE

## 2021-09-03 LAB — PREPARE RBC (CROSSMATCH)

## 2021-09-03 MED ORDER — SODIUM CHLORIDE 0.9% IV SOLUTION
Freq: Once | INTRAVENOUS | Status: AC
Start: 2021-09-03 — End: 2021-09-03

## 2021-09-03 MED ORDER — SODIUM CHLORIDE 0.9% IV SOLUTION: Freq: Once | INTRAVENOUS | Status: AC

## 2021-09-03 MED ORDER — IPRATROPIUM-ALBUTEROL 0.5-2.5 (3) MG/3ML IN SOLN
3.0000 mL | Freq: Three times a day (TID) | RESPIRATORY_TRACT | Status: DC
  Administered 2021-09-03 – 2021-09-04 (×3): 3 mL via RESPIRATORY_TRACT
  Filled 2021-09-03 (×4): qty 3

## 2021-09-03 MED ORDER — LORAZEPAM 2 MG/ML IJ SOLN
1.0000 mg | Freq: Once | INTRAMUSCULAR | Status: AC | PRN
  Administered 2021-09-03: 1 mg via INTRAVENOUS

## 2021-09-03 NOTE — Progress Notes (Signed)
       CROSS COVER NOTE  NAME: Jared Gregory MRN: [id number] DOB : 12/19/1955    Date of Service   09/03/2021   HPI/Events of Note   HGB 6.2 after 2U PRBC was 5.8 prior. Ultrasound ordered for DVT R/O aborted tonight secondary to Mr Horst becoming anxious during test and requesting to be returned to his room. Mr Canelo has a fistula on the LUE preventing inserting IV on that arm.   Interventions   Plan: 2U PRBC Ativan 1 mg and complete Ultrasound Ultrasound not read at ~0200; Start 1st unit of PRBCs       This document was prepared using Dragon voice recognition software and may include unintentional dictation errors.  Neomia Glass DNP, MHA, FNP-BC Nurse Practitioner [facility] Pager [phone]

## 2021-09-03 NOTE — Progress Notes (Signed)
Jared Gregory  MRN: [id number]  DOB/AGE: 1955/02/14 66 y.o.  Primary Care Physician:[facility] [place] date: 09/02/2021  Chief Complaint:  Chief Complaint  Patient presents with   Weakness    S-Pt presented on  09/02/2021 with  Chief Complaint  Patient presents with   Weakness  . Patient was seen today on first floor. Patient resting comfortably in the bed. Patient offers no new specific physical complaint. I then discussed the patient regarding his kidney related issues.  Educated patient with the worsening of his kidney disease.  Educated patient on possible need for renal placement therapy soon.  Medications    sodium chloride   Intravenous Once   amLODipine  10 mg Oral Daily   ascorbic acid  500 mg Oral Daily   atorvastatin  10 mg Oral Daily   carvedilol  25 mg Oral BID WC   cholecalciferol  2,000 Units Oral Q1400   cycloSPORINE  75 mg Oral BID   DULoxetine  60 mg Oral Daily   ferrous sulfate  325 mg Oral Q breakfast   finasteride  5 mg Oral Daily   fluticasone furoate-vilanterol  1 puff Inhalation Daily   folic acid  1 mg Oral Daily   guaiFENesin  600 mg Oral BID   hydrALAZINE  75 mg Oral TID   insulin aspart  0-5 Units Subcutaneous QHS   insulin aspart  0-9 Units Subcutaneous TID WC   ipratropium-albuterol  3 mL Nebulization QID   lacosamide  150 mg Oral BID   mycophenolate  180 mg Oral BID   polyethylene glycol  17 g Oral Daily   sodium bicarbonate  650 mg Oral TID   tamsulosin  0.8 mg Oral Daily         GLO:VFIEP from the symptoms mentioned above,there are no other symptoms referable to all systems reviewed.  Physical Exam: Vital signs in last 24 hours: Temp:  [98 F (36.7 C)-98.2 F (36.8 C)] 98.1 F (36.7 C) (08/27 0814) Pulse Rate:  [63-81] 63 (08/27 0814) Resp:  [16-17] 16 (08/27 0814) BP: (127-160)/(64-85) 151/82 (08/27 0814) SpO2:  [93 %-100 %] 99 % (08/27 0814) Weight change:  Last BM Date : 09/01/21  Intake/Output  from previous day: 08/26 0701 - 08/27 0700 In: 240 [P.O.:240] Out: 600 [Urine:600] Total I/O In: -  Out: 150 [Urine:150]   Physical Exam:  General- pt is awake,alert, oriented to time place and person  Resp- No acute REsp distress, Decreased breath sounds at bases  CVS- S1S2 regular in rate and rhythm  GIT- BS+, soft, Non tender , Non distended  EXT- No LE Edema,  No Cyanosis Left upper extremity edema present  Lab Results:  CBC  Recent Labs    09/01/21 1952 09/03/21 0635  WBC 10.6* 5.9  HGB 7.1* 5.8*  HCT 23.9* 19.0*  PLT 169 160    BMET  Recent Labs    09/01/21 1952 09/02/21 2012 09/03/21 0635  NA 141  --  142  K 5.7* 4.7 4.8  CL 108  --  111  CO2 23  --  25  GLUCOSE 154*  --  98  BUN 60*  --  65*  CREATININE 4.31*  --  4.48*  CALCIUM 8.7*  --  8.4*      Most recent Creatinine trend  Lab Results  Component Value Date   CREATININE 4.48 (H) 09/03/2021   CREATININE 4.31 (H) 09/01/2021   CREATININE 3.76 (H) 03/14/2021      MICRO  Recent Results (from the past 240 hour(s))  SARS Coronavirus 2 by RT PCR (hospital order, performed in [facility] hospital lab) *cepheid single result test* Anterior Nasal Swab     Status: None   Collection Time: 09/02/21  3:04 AM   Specimen: Anterior Nasal Swab  Result Value Ref Range Status   SARS Coronavirus 2 by RT PCR NEGATIVE NEGATIVE Final    Comment: (NOTE) SARS-CoV-2 target nucleic acids are NOT DETECTED.  The SARS-CoV-2 RNA is generally detectable in upper and lower respiratory specimens during the acute phase of infection. The lowest concentration of SARS-CoV-2 viral copies this assay can detect is 250 copies / mL. A negative result does not preclude SARS-CoV-2 infection and should not be used as the sole basis for treatment or other patient management decisions.  A negative result may occur with improper specimen collection / handling, submission of specimen other than nasopharyngeal swab,  presence of viral mutation(s) within the areas targeted by this assay, and inadequate number of viral copies (<250 copies / mL). A negative result must be combined with clinical observations, patient history, and epidemiological information.  Fact Sheet for Patients:   https://www.patel.info/  Fact Sheet for Healthcare Providers: https://hall.com/  This test is not yet approved or  cleared by the [place] FDA and has been authorized for detection and/or diagnosis of SARS-CoV-2 by FDA under an Emergency Use Authorization (EUA).  This EUA will remain in effect (meaning this test can be used) for the duration of the COVID-19 declaration under Section 564(b)(1) of the Act, 21 U.S.C. section 360bbb-3(b)(1), unless the authorization is terminated or revoked sooner.  Performed at [facility], [address] [place], [place] [address]   Culture, blood (routine x 2)     Status: None (Preliminary result)   Collection Time: 09/02/21  4:58 AM   Specimen: BLOOD  Result Value Ref Range Status   Specimen Description BLOOD RIGHT [facility]  Final   Special Requests   Final    BOTTLES DRAWN AEROBIC AND ANAEROBIC Blood Culture results may not be optimal due to an excessive volume of blood received in culture bottles   Culture   Final    NO GROWTH 1 DAY Performed at [facility], [address] [place], [place] [address]    Report Status PENDING  Incomplete  Culture, blood (routine x 2)     Status: None (Preliminary result)   Collection Time: 09/02/21  6:37 AM   Specimen: BLOOD RIGHT HAND  Result Value Ref Range Status   Specimen Description BLOOD RIGHT HAND  Final   Special Requests   Final    BOTTLES DRAWN AEROBIC AND ANAEROBIC Blood Culture adequate volume   Culture   Final    NO GROWTH < 24 HOURS Performed at [facility], [address], [place], [place] [address]    Report Status PENDING  Incomplete          Impression:  Patient is a 66 year old male with a past medical history of end-stage renal disease, s/p renal transplant, history of MGUS, hypertension, hyperlipidemia, diabetes mellitus type 2, secondary hyperparathyroidism, seizures, coronary artery disease who was brought to the ER with chief complaint of weakness.  Patient is currently admitted with   Aspiration pneumonia [facility]) Active Problems:   Hyperkalemia   CKD (chronic kidney disease), stage IV ([place])   Renal transplant recipient   Essential hypertension   Seizure-like activity ([place])   HLD (hyperlipidemia)   Type II diabetes mellitus with renal  manifestations (H[facility])   BPH (benign prostatic hyperplasia)   Nausea & vomiting   Anemia in chronic kidney disease (CKD)   Depression   Chronic a-fib (HCC)   UTI (urinary tract infection)     1)Renal   AKI Patient baseline creatinine has been around 3.9 now patient creatinine is at 4.5 AKI is worsening Patient AKI is worsening most likely secondary to intravascular hypovolemia as patient's hemoglobin is less than 6  Possible contribution from obstructive issues as patient CT scan had shown that there is fullness in the collecting system Renal ultrasound does show hydronephrosis/moderate pelviectasis which has worsened than before   CKD stage IV Patient has CKD stage IV most likely secondary to multiple factors Diabetes mellitus/hypertension/calcineurin nephropathy. Patient has history of s/p renal transplant     Patient lab data from care everywhere was reviewed Patient creatinine was 4.1 when done on August 09, 2021 Patient potassium was 5.7-high   Patient creatinine was 3.9 on August 02, 2021 Patient has history of ESRD secondary diabetes mellitus/hypertension Patient is s/p deceased donor kidney transplant received at [facility] on May 05, 2015 Patient was on hemodialysis from 2011 till May 05, 2015 Patient had delayed graft function requiring dialysis for  hyperkalemia after his transplant Patient had a KDPI of 71% and PRA of 0% at the time of transplantation Patient was on prednisone sparing immunosuppression Patient was earlier on tacrolimus which was switched to cyclosporine on April, 2018 secondary to neuropathy 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  Patient thereafter developed nephrotic range proteinuria on June 2022.  Patient was thought to not be candidate for kidney biopsy because of severe underlying kidney disease Patient had has multiple episodes of AKI.  Patient had AKI in March of  23, then again in April 2023.  Patient has been deemed CKD stage IV going back to July 2023.     Immunosuppression Patient is on cyclosporine 75 mg p.o. twice daily Mycophenolate sodium 180 mg p.o. twice daily Patient is on steroid sparing immunosuppression   Creatinine trend 2023 3.9--4.1, now 4.5 2022 2.4--2.7AKI with a peak creatinine of 3.1 2021 2.0--2.7-AKI 2021.9--2.8-AKI 2019 1.4--1.8-AKI Patient had renal transplant done in 2017 and had delayed graft function      Patient cyclosporine levels pending Patient AKI is worsening I discussed with the patient for possible need for renal placement therapy soon   2)HTN  Patient blood pressure on the higher side but because of his Acute situation/worsening anemia, we will hold off on changing his antihypertensive regimen at this time     3)Anemia of chronic disease     Latest Ref Rng & Units 09/03/2021    6:35 AM 09/01/2021    7:52 PM 03/14/2021    3:56 PM  CBC  WBC 4.0 - 10.5 K/uL 5.9  10.6  5.4   Hemoglobin 13.0 - 17.0 g/dL 5.8  7.1  7.9   Hematocrit 39.0 - 52.0 % 19.0  23.9  25.9   Platelets 150 - 400 K/uL 160  169  223        HGb is not at goal (9--11) I discussed with primary team regarding need for PRBC. Patient is to get 2 units of PRBC today  4) Secondary hyperparathyroidism -CKD Mineral-Bone Disorder    Lab Results  Component Value Date   CALCIUM  8.4 (L) 09/03/2021   PHOS 3.4 01/18/2021    Patient has history of secondary hyperparathyroidism Patient intact PTH level was high at 100.9 on June 22, 2021     5)MGUS Patient is being followed by the hematology oncology at the [facility]     6) Electrolytes      Latest Ref Rng & Units 09/03/2021    6:35 AM 09/02/2021    8:12 PM 09/01/2021    7:52 PM  BMP  Glucose 70 - 99 mg/dL 98   154   BUN 8 - 23 mg/dL 65   60   Creatinine 0.61 - 1.24 mg/dL 4.48   4.31   Sodium 135 - 145 mmol/L 142   141   Potassium 3.5 - 5.1 mmol/L 4.8  4.7  5.7   Chloride 98 - 111 mmol/L 111   108   CO2 22 - 32 mmol/L 25   23   Calcium 8.9 - 10.3 mg/dL 8.4   8.7      Sodium Normonatremic  Hyperkalemia Patient had hyperkalemia  patient on Lokelma Patient has responded to [facility] Patient potassium is now better    7)Aspiration pneumonia Patient is antibiotics Primary team is following     8) Proteinuria Patient has history of nephrotic range proteinuria Patient is currently not on any RAS blockers secondary to his low GFR     9)Chronic metabolic acidosis Patient is on p.o. bicarb       Plan:  Patient is to get 2 units of PRBC today Patient AKI is worsening Patient cyclosporine level is pending Patient will most likely need renal placement therapy soon      Jared Gregory s Hazely Sealey 09/03/2021, 8:56 AM

## 2021-09-03 NOTE — Progress Notes (Signed)
Nurse was advised by the Ultrasound tech that the patient could not continue with the ultrasound as he became anxious. Tech states that they will more than likely try again in the morning.

## 2021-09-03 NOTE — Progress Notes (Signed)
Pt is going to have US done now to rule out DVT per MD. If no DVT is present, current IV in right arm can be used per bedside RN/MD.

## 2021-09-03 NOTE — Plan of Care (Signed)
°  Problem: Coping: °Goal: Level of anxiety will decrease °Outcome: Progressing °  °

## 2021-09-03 NOTE — Progress Notes (Signed)
PROGRESS NOTE    Jaelyn Cloninger  FXT:[id number] DOB: [birth year] DOA: 09/02/2021 PCP: [facility]    Brief Narrative:  66 y.o. male with medical history significant of s/p of kidney transplantation on immunosuppressants, HTN, HLD, DM, depression with anxiety, atrial fibrillation on Eliquis, BPH, anemia, CKD-4,  MGUS, who presents with nausea vomiting and cough.   Patient states that he has nausea vomiting for 2 days.  He has several episodes of nonbilious nonbloody vomiting each day.  He developed dry cough and mild shortness breath today.  No chest pain.  No fever or chills.  Patient is now late using oxygen only at night as needed.  He was found to have oxygen desaturation to 91% on room air, which improved to 95% on 2 L oxygen.  Patient does not have symptoms of UTI.  No unilateral numbness or tinglings extremities.  8/27: Hemoglobin worsened to 5.8.  Receiving 2 units PRBC.  Cyclosporine level pending.  Case discussed with nephrology.  Patient will likely need renal replacement therapy soon.  Creatinine worsening over interval.   Assessment & Plan:   Principal Problem:   Aspiration pneumonia (HCC) Active Problems:   Hyperkalemia   CKD (chronic kidney disease), stage IV (HCC)   Renal transplant recipient   Essential hypertension   Seizure-like activity (HCC)   HLD (hyperlipidemia)   Type II diabetes mellitus with renal manifestations (HCC)   BPH (benign prostatic hyperplasia)   Nausea & vomiting   Anemia in chronic kidney disease (CKD)   Depression   Chronic a-fib (HCC)   UTI (urinary tract infection)  Acute on chronic anemia of chronic kidney disease Unclear etiology of acute on chronic anemia.  No clear evidence of blood loss.  No change in hemodynamics. Hemoglobin 7.1 on presentation, decreased to 5.8 Plan: Transfuse 2 units PRBC Posttransfusion H&H Daily CBC  Aspiration pneumonia (HCC) Patient has 2 L oxygen requirement, does not meet criteria for  sepsis Plan: Continue Unasyn Bronchodilators Mucolytic's and antitussives Follow cultures  Hyperkalemia Potassium 5.7 -Treated with D50, 8 units of NovoLog, 10 g of leukoma, now improved   CKD (chronic kidney disease), stage IV (HCC) Worsening baseline.  Recent baseline creatinine 3.76, BUN 4.31 -Consulted Dr.  Theador Hawthorne of nephrology -Hold torsemide -Patient may need renal replacement therapy -Daily BMP   Renal transplant recipient Patient is taking mycophenolate and cyclosporine.  US-renal showed transplanted kidney with moderate pelvicaliectasis/hydronephrosis, increased since March 14, 2021 ultrasound imaging. Bladder scan showed 200 retention. No obstructive stone. UA is positive for UTI Plan: -Continue home mycophenolate and cyclosporine -Check cyclosporine level, pending -Continue Foley catheter -pt is on unasyn (received 1 dose of Rocephin earlier in ED)  Essential hypertension - IV hydralazine as needed -Amlodipine, Coreg, hydralazine -Hold torsemide   Seizure-like activity (HCC) - Seizure precaution -As needed Ativan for seizure -Continue home lacosamide   HLD (hyperlipidemia) - Lipitor   Type II diabetes mellitus with renal manifestations (HCC) Recent A1c 5.2, well controlled.  Patient is taking Tradjenta at home -Sliding scale insulin   BPH (benign prostatic hyperplasia) - Flomax and Proscar   Nausea & vomiting Possibly due to UTI.  -As needed Zofran  Depression - Continue home medications   UTI (urinary tract infection) -on IV Unasyn (received 1 dose of Rocephin in ED) -Follow-up urine culture   Chronic a-fib (HCC) Heart rate 89 -Continue Eliquis, Coreg  DVT prophylaxis: Eliquis, currently on hold considering acute anemia Code Status: Full Family Communication: Attempted to call Ermalinda Memos [phone] on 8/27.  No answer.  Voicemail not left on generic message. Disposition Plan: Status is: Inpatient Remains inpatient appropriate because:  AKI on CKD.  Acute on chronic anemia requiring transfusion   Level of care: Med-Surg  Consultants:  Nephrology  Procedures:  None  Antimicrobials: Unasyn   Subjective: Seen and examined.  Resting in bed.  No visible distress.  No complaints of pain.  Objective: Vitals:   09/03/21 0446 09/03/21 0814 09/03/21 1107 09/03/21 1132  BP: 134/72 (!) 151/82 (!) 168/75 (!) 154/80  Pulse: 65 63 69 72  Resp: '17 16 19 15  '$ Temp: 98 F (36.7 C) 98.1 F (36.7 C) (!) 97.4 F (36.3 C) 97.6 F (36.4 C)  TempSrc:   Axillary Axillary  SpO2: 98% 99% 97% 97%  Weight:      Height:        Intake/Output Summary (Last 24 hours) at 09/03/2021 1157 Last data filed at 09/03/2021 7628 Gross per 24 hour  Intake 240 ml  Output 750 ml  Net -510 ml   Filed Weights   09/01/21 1936  Weight: 67.1 kg    Examination:  General exam: No cute distress Respiratory system: Clear to auscultation. Respiratory effort normal. Cardiovascular system: S1-S2, RRR, no murmurs, no pedal edema Gastrointestinal system: Soft, NT/ND, normal bowel sounds Central nervous system: ANO x3, no focal deficits Skin: No rashes, lesions or ulcers Psychiatry: Judgement and insight appear normal. Mood & affect appropriate.     Data Reviewed: I have personally reviewed following labs and imaging studies  CBC: Recent Labs  Lab 09/01/21 1952 09/03/21 0635  WBC 10.6* 5.9  HGB 7.1* 5.8*  HCT 23.9* 19.0*  MCV 77.9* 76.3*  PLT 169 315   Basic Metabolic Panel: Recent Labs  Lab 09/01/21 1952 09/02/21 2012 09/03/21 0635  NA 141  --  142  K 5.7* 4.7 4.8  CL 108  --  111  CO2 23  --  25  GLUCOSE 154*  --  98  BUN 60*  --  65*  CREATININE 4.31*  --  4.48*  CALCIUM 8.7*  --  8.4*   GFR: Estimated Creatinine Clearance: 15.8 mL/min (A) (by C-G formula based on SCr of 4.48 mg/dL (H)). Liver Function Tests: Recent Labs  Lab 09/01/21 1952  AST 18  ALT 9  ALKPHOS 46  BILITOT 0.9  PROT 7.3  ALBUMIN 3.2*    Recent Labs  Lab 09/01/21 1952  LIPASE 22   No results for input(s): "AMMONIA" in the last 168 hours. Coagulation Profile: No results for input(s): "INR", "PROTIME" in the last 168 hours. Cardiac Enzymes: No results for input(s): "CKTOTAL", "CKMB", "CKMBINDEX", "TROPONINI" in the last 168 hours. BNP (last 3 results) No results for input(s): "PROBNP" in the last 8760 hours. HbA1C: No results for input(s): "HGBA1C" in the last 72 hours. CBG: Recent Labs  Lab 09/01/21 1948 09/02/21 1229 09/02/21 2314 09/03/21 0821 09/03/21 1144  GLUCAP 149* 91 137* 118* 100*   Lipid Profile: No results for input(s): "CHOL", "HDL", "LDLCALC", "TRIG", "CHOLHDL", "LDLDIRECT" in the last 72 hours. Thyroid Function Tests: No results for input(s): "TSH", "T4TOTAL", "FREET4", "T3FREE", "THYROIDAB" in the last 72 hours. Anemia Panel: No results for input(s): "VITAMINB12", "FOLATE", "FERRITIN", "TIBC", "IRON", "RETICCTPCT" in the last 72 hours. Sepsis Labs: Recent Labs  Lab 09/02/21 0458 09/02/21 0637  PROCALCITON  --  0.39  LATICACIDVEN 0.7 0.6    Recent Results (from the past 240 hour(s))  SARS Coronavirus 2 by RT PCR (hospital order, performed in [facility]  hospital lab) *cepheid single result test* Anterior Nasal Swab     Status: None   Collection Time: 09/02/21  3:04 AM   Specimen: Anterior Nasal Swab  Result Value Ref Range Status   SARS Coronavirus 2 by RT PCR NEGATIVE NEGATIVE Final    Comment: (NOTE) SARS-CoV-2 target nucleic acids are NOT DETECTED.  The SARS-CoV-2 RNA is generally detectable in upper and lower respiratory specimens during the acute phase of infection. The lowest concentration of SARS-CoV-2 viral copies this assay can detect is 250 copies / mL. A negative result does not preclude SARS-CoV-2 infection and should not be used as the sole basis for treatment or other patient management decisions.  A negative result may occur with improper specimen collection /  handling, submission of specimen other than nasopharyngeal swab, presence of viral mutation(s) within the areas targeted by this assay, and inadequate number of viral copies (<250 copies / mL). A negative result must be combined with clinical observations, patient history, and epidemiological information.  Fact Sheet for Patients:   https://www.patel.info/  Fact Sheet for Healthcare Providers: https://hall.com/  This test is not yet approved or  cleared by the [place] FDA and has been authorized for detection and/or diagnosis of SARS-CoV-2 by FDA under an Emergency Use Authorization (EUA).  This EUA will remain in effect (meaning this test can be used) for the duration of the COVID-19 declaration under Section 564(b)(1) of the Act, 21 U.S.C. section 360bbb-3(b)(1), unless the authorization is terminated or revoked sooner.  Performed at [facility], [address] [place], [place] [address]   Culture, blood (routine x 2)     Status: None (Preliminary result)   Collection Time: 09/02/21  4:58 AM   Specimen: BLOOD  Result Value Ref Range Status   Specimen Description BLOOD RIGHT [facility]  Final   Special Requests   Final    BOTTLES DRAWN AEROBIC AND ANAEROBIC Blood Culture results may not be optimal due to an excessive volume of blood received in culture bottles   Culture   Final    NO GROWTH 1 DAY Performed at [facility], [address] [place], [place] [address]    Report Status PENDING  Incomplete  Culture, blood (routine x 2)     Status: None (Preliminary result)   Collection Time: 09/02/21  6:37 AM   Specimen: BLOOD RIGHT HAND  Result Value Ref Range Status   Specimen Description BLOOD RIGHT HAND  Final   Special Requests   Final    BOTTLES DRAWN AEROBIC AND ANAEROBIC Blood Culture adequate volume   Culture   Final    NO GROWTH < 24 HOURS Performed at [facility], [address]  [place], [place] [address]    Report Status PENDING  Incomplete         Radiology Studies: US RENAL  Result Date: 09/02/2021 CLINICAL DATA:  Renal transplant.  ATN. EXAM: RENAL / URINARY TRACT ULTRASOUND COMPLETE COMPARISON:  CT scan of the abdomen and pelvis September 02, 2021. Renal ultrasound March 14, 2021. FINDINGS: Right Kidney: Renal measurements: 7.9 x 3.1 x 4.2 cm = volume: 53 mL. Marked increased cortical echogenicity. Left Kidney: Not visualized. Bladder: Debris in the bladder. Right Transplanted kidney: Renal measurements: 11.6 x 6.1 x 6.4 cm = volume: 230 a mL. Moderate pelvicaliectasis/hydronephrosis, increased since March 14, 2021. Other: None. IMPRESSION: 1. The transplanted kidney in the right lower quadrant/right pelvis demonstrates moderate pelvicaliectasis/hydronephrosis, increased since March 14, 2021 ultrasound imaging. 2. The right  kidney is a trophic consistent with history. The left kidney could not be visualized. 3. Mild dependent debris in the bladder. Electronically Signed   By: Dorise Bullion III M.D.   On: 09/02/2021 14:43   DG Chest Port 1 View  Result Date: 09/02/2021 CLINICAL DATA:  Vomiting and hypoxia. EXAM: PORTABLE CHEST 1 VIEW COMPARISON:  Chest CT 01/13/2021 cough portable chest 01/10/2022. FINDINGS: There is mild-to-moderate cardiomegaly. There is increased central vascular distension and flow cephalization and increased interstitial edema in the base of both lungs with small pleural effusions. There are patchy opacities in the left mid perihilar and lower lung fields, hazy interstitial consolidation in the right infrahilar area, findings most likely indicating superimposed multifocal pneumonia. Underlying edema component of airspace disease not excluded. The remaining lungs are generally clear. There is slight aortic tortuosity with stable mediastinum. No acute osseous abnormality. IMPRESSION: 1. Cardiomegaly, perihilar vascular distension and basilar interstitial edema.  Findings consistent with CHF or fluid overload. 2. Small pleural effusions, with opacities in the left mid and lower lung field and right infrahilar area which most likely reflect superimposed multifocal pneumonia, less likely alveolar edema. 3. Clinical correlation and radiographic follow-up recommended. Electronically Signed   By: Telford Nab M.D.   On: 09/02/2021 02:52   CT Abdomen Pelvis Wo Contrast  Result Date: 09/02/2021 CLINICAL DATA:  Nausea and vomiting for 2 days EXAM: CT ABDOMEN AND PELVIS WITHOUT CONTRAST TECHNIQUE: Multidetector CT imaging of the abdomen and pelvis was performed following the standard protocol without IV contrast. RADIATION DOSE REDUCTION: This exam was performed according to the departmental dose-optimization program which includes automated exposure control, adjustment of the mA and/or kV according to patient size and/or use of iterative reconstruction technique. COMPARISON:  None Available. FINDINGS: Lower chest: Lung bases demonstrate small bilateral pleural effusions right greater than left with associated atelectasis in the right base and patchy atelectatic changes in the left base. These changes suggest possible aspiration given some hyperdense material within the substance of the right lower lobe. Hepatobiliary: No focal liver abnormality is seen. No gallstones, gallbladder wall thickening, or biliary dilatation. Pancreas: Unremarkable. No pancreatic ductal dilatation or surrounding inflammatory changes. Spleen: Normal in size without focal abnormality. Adrenals/Urinary Tract: Adrenal glands are within normal limits. Heavy renal vascular calcifications are noted. No focal mass is seen. Small cysts are again identified in the kidneys bilaterally. No follow-up is recommended. No obstructive changes are seen. The bladder is well distended. A transplant kidney is noted in the right hemipelvis. Mild fullness of the transplant renal pelvis and ureter is noted although no  obstructing lesion is seen. Stomach/Bowel: Scattered fecal material is noted throughout the colon without obstructive change. The appendix is not discretely visualized although no inflammatory changes to suggest appendicitis are noted. Small bowel and stomach appear within normal limits. Vascular/Lymphatic: Aortic atherosclerosis. No enlarged abdominal or pelvic lymph nodes. Reproductive: Prostate is unremarkable. Other: Mild changes of anasarca are noted.  No ascites is seen. Musculoskeletal: No acute or significant osseous findings. IMPRESSION: Bilateral pleural effusions right greater than left with associated atelectatic changes and findings suspicious for aspiration. Scattered colonic fecal material without evidence of obstruction. Changes of anasarca. Right pelvic transplant kidney with fullness of the collecting system and ureter although no obstructing lesion is noted. Electronically Signed   By: Inez Catalina M.D.   On: 09/02/2021 02:47        Scheduled Meds:  sodium chloride   Intravenous Once   amLODipine  10 mg Oral Daily  ascorbic acid  500 mg Oral Daily   atorvastatin  10 mg Oral Daily   carvedilol  25 mg Oral BID WC   cholecalciferol  2,000 Units Oral Q1400   cycloSPORINE  75 mg Oral BID   DULoxetine  60 mg Oral Daily   ferrous sulfate  325 mg Oral Q breakfast   finasteride  5 mg Oral Daily   fluticasone furoate-vilanterol  1 puff Inhalation Daily   folic acid  1 mg Oral Daily   guaiFENesin  600 mg Oral BID   hydrALAZINE  75 mg Oral TID   insulin aspart  0-5 Units Subcutaneous QHS   insulin aspart  0-9 Units Subcutaneous TID WC   ipratropium-albuterol  3 mL Nebulization TID   lacosamide  150 mg Oral BID   mycophenolate  180 mg Oral BID   polyethylene glycol  17 g Oral Daily   sodium bicarbonate  650 mg Oral TID   tamsulosin  0.8 mg Oral Daily   Continuous Infusions:  ampicillin-sulbactam (UNASYN) IV       LOS: 1 day       Sidney Ace, MD [facility]   If 7PM-7AM, please contact night-coverage  09/03/2021, 11:57 AM

## 2021-09-03 NOTE — Progress Notes (Signed)
Consult placed to start IV for pt to receive blood transfusion. RN states pt has order for [place] to rule out blood clot in right arm, and has a fistula in left arm. RN to notify renal MD to okay left hand use for IV due to above. IV Team will remain available.

## 2021-09-04 LAB — CBC WITH DIFFERENTIAL/PLATELET
Abs Immature Granulocytes: 0.03 10*3/uL (ref 0.00–0.07)
Basophils Absolute: 0 10*3/uL (ref 0.0–0.1)
Basophils Relative: 0 %
Eosinophils Absolute: 0.1 10*3/uL (ref 0.0–0.5)
Eosinophils Relative: 3 %
HCT: 27 % — ABNORMAL LOW (ref 39.0–52.0)
Hemoglobin: 8.4 g/dL — ABNORMAL LOW (ref 13.0–17.0)
Immature Granulocytes: 1 %
Lymphocytes Relative: 7 %
Lymphs Abs: 0.3 10*3/uL — ABNORMAL LOW (ref 0.7–4.0)
MCH: 23.8 pg — ABNORMAL LOW (ref 26.0–34.0)
MCHC: 31.1 g/dL (ref 30.0–36.0)
MCV: 76.5 fL — ABNORMAL LOW (ref 80.0–100.0)
Monocytes Absolute: 0.3 10*3/uL (ref 0.1–1.0)
Monocytes Relative: 7 %
Neutro Abs: 4 10*3/uL (ref 1.7–7.7)
Neutrophils Relative %: 82 %
Platelets: 236 10*3/uL (ref 150–400)
RBC: 3.53 MIL/uL — ABNORMAL LOW (ref 4.22–5.81)
RDW: 20.5 % — ABNORMAL HIGH (ref 11.5–15.5)
WBC: 4.8 10*3/uL (ref 4.0–10.5)
nRBC: 0 % (ref 0.0–0.2)

## 2021-09-04 LAB — HEMOGLOBIN AND HEMATOCRIT, BLOOD
HCT: 25.7 % — ABNORMAL LOW (ref 39.0–52.0)
Hemoglobin: 8 g/dL — ABNORMAL LOW (ref 13.0–17.0)

## 2021-09-04 LAB — BASIC METABOLIC PANEL
Anion gap: 11 (ref 5–15)
BUN: 66 mg/dL — ABNORMAL HIGH (ref 8–23)
CO2: 21 mmol/L — ABNORMAL LOW (ref 22–32)
Calcium: 8.7 mg/dL — ABNORMAL LOW (ref 8.9–10.3)
Chloride: 108 mmol/L (ref 98–111)
Creatinine, Ser: 4.28 mg/dL — ABNORMAL HIGH (ref 0.61–1.24)
GFR, Estimated: 15 mL/min — ABNORMAL LOW (ref >60)
Glucose, Bld: 118 mg/dL — ABNORMAL HIGH (ref 70–99)
Potassium: 4.8 mmol/L (ref 3.5–5.1)
Sodium: 140 mmol/L (ref 135–145)

## 2021-09-04 LAB — GLUCOSE, CAPILLARY
Glucose-Capillary: 99 mg/dL (ref 70–99)
Glucose-Capillary: 166 mg/dL — ABNORMAL HIGH (ref 70–99)
Glucose-Capillary: 136 mg/dL — ABNORMAL HIGH (ref 70–99)
Glucose-Capillary: 132 mg/dL — ABNORMAL HIGH (ref 70–99)

## 2021-09-04 MED ORDER — IPRATROPIUM-ALBUTEROL 0.5-2.5 (3) MG/3ML IN SOLN
3.0000 mL | Freq: Two times a day (BID) | RESPIRATORY_TRACT | Status: DC
  Administered 2021-09-04 – 2021-09-06 (×4): 3 mL via RESPIRATORY_TRACT
  Filled 2021-09-04 (×4): qty 3

## 2021-09-04 NOTE — Progress Notes (Addendum)
Nurse reached back out to Jaci Carrel, NP to inform her that the patient could not get the US done D/T anxiety and that an order was placed to the IV team to see if another IV could be obtained elsewhere since the patient could not get his  US done to r/o DVT so the current site can be used to transfuse the blood.  IV team stated that they could do the IV in the upper arm or have the nurse reach out to Nephrology and inquire if an IV can be placed on the side with the AV fistula.  Nurse advised Jaci Carrel of the above and ultimately the [place] was reordered to give the patient Ativan to help with he anxiety.  Katie advised the nurse that if there is a significant delay then will will start the blood with current IV.

## 2021-09-04 NOTE — Progress Notes (Signed)
Nurse reached out to [facility] and informed her that the 2nd unit will not be started d/t increased swelling and ecchymosis.    Katie advised to hold off on the 2nd unit as there is not IV access.  Nurse did inform Joellen Jersey that the [place] results are in and shows that  there is no DVT.  Nurse also informed Joellen Jersey that she will place an order for IV consult for IV in the right upper arm since the [place] did not show a DVT.  Nurse will keep a warm compress on the right lower arm.

## 2021-09-04 NOTE — TOC Progression Note (Signed)
Transition of Care [facility]) - Progression Note    Patient Details  Name: Jared Gregory MRN: [id number] Date of Birth: [birth year]  Transition of Care [facility]) CM/SW [place], RN Phone Number: 09/04/2021, 4:56 PM  Clinical Narrative:     Met with the patient in the room, He stated he really is not at a place mentally to talk to me, he stated that he is having an anxiety attack, I notified the charge nurse and she was checking on  medications that are ordered for him TOC will see him again later to help assist with DC planning      Expected Discharge Plan and Services                                                 Social Determinants of Health (SDOH) Interventions    Readmission Risk Interventions    01/18/2021    1:48 PM  Readmission Risk Prevention Plan  Transportation Screening Complete  PCP or Specialist Appt within 3-5 Days Complete  HRI or Home Care Consult Complete  Social Work Consult for [place] Planning/Counseling Not Complete  SW consult not completed comments RNCM assigned  Palliative Care Screening Complete  Medication Review [employer] photographer) Complete

## 2021-09-04 NOTE — Progress Notes (Addendum)
Nurse reached out to [facility] and informed her that [place] was called and that we's still waiting on the results  Katie, stated to go ahead and start the blood transfusion.

## 2021-09-04 NOTE — Significant Event (Signed)
Nurse reached out to Jared Carrel, NP regarding the Hgb level of 6.2.  Provider added in an order for 2 units of blood.

## 2021-09-04 NOTE — Progress Notes (Signed)
PROGRESS NOTE    Jared Gregory  JIR:[id number] DOB: [birth year] DOA: 09/02/2021 PCP: [facility]    Brief Narrative:  66 y.o. male with medical history significant of s/p of kidney transplantation on immunosuppressants, HTN, HLD, DM, depression with anxiety, atrial fibrillation on Eliquis, BPH, anemia, CKD-4,  MGUS, who presents with nausea vomiting and cough.   Patient states that he has nausea vomiting for 2 days.  He has several episodes of nonbilious nonbloody vomiting each day.  He developed dry cough and mild shortness breath today.  No chest pain.  No fever or chills.  Patient is now late using oxygen only at night as needed.  He was found to have oxygen desaturation to 91% on room air, which improved to 95% on 2 L oxygen.  Patient does not have symptoms of UTI.  No unilateral numbness or tinglings extremities.  8/27: Hemoglobin worsened to 5.8.  Receiving 2 units PRBC.  Cyclosporine level pending.  Case discussed with nephrology.  Patient will likely need renal replacement therapy soon.  Creatinine worsening over interval.   Assessment & Plan:   Principal Problem:   Aspiration pneumonia (HCC) Active Problems:   Hyperkalemia   CKD (chronic kidney disease), stage IV (HCC)   Renal transplant recipient   Essential hypertension   Seizure-like activity (HCC)   HLD (hyperlipidemia)   Type II diabetes mellitus with renal manifestations (HCC)   BPH (benign prostatic hyperplasia)   Nausea & vomiting   Anemia in chronic kidney disease (CKD)   Depression   Chronic a-fib (HCC)   UTI (urinary tract infection)  Acute on chronic anemia of chronic kidney disease Unclear etiology of acute on chronic anemia.  No clear evidence of blood loss.  No change in hemodynamics. Hemoglobin 7.1 on presentation, decreased to 5.8 Transfusion delayed by lack of IV access Plan: Recheck hemoglobin after transfusion of 2 units Transfuse as needed to goal of at least 7 Daily see  BC  Aspiration pneumonia [facility]) Patient has 2 L oxygen requirement, does not meet criteria for sepsis Plan: Continue Unasyn, plan for 5-day course Bronchodilators Mucolytic's and antitussives Follow cultures, no growth to date  Hyperkalemia Potassium 5.7 -Treated with D50, 8 units of NovoLog, 10 g of leukoma, now improved   CKD (chronic kidney disease), stage IV (HCC) Worsening baseline.  Recent baseline creatinine 3.76, BUN 4.31 -Consulted Dr.  Theador Hawthorne of nephrology -Hold torsemide -Patient may need renal replacement therapy -Daily BMP -Nephrology follow-up   Renal transplant recipient Patient is taking mycophenolate and cyclosporine.  US-renal showed transplanted kidney with moderate pelvicaliectasis/hydronephrosis, increased since March 14, 2021 ultrasound imaging. Bladder scan showed 200 retention. No obstructive stone. UA is positive for UTI Plan: -Continue home mycophenolate and cyclosporine -Check cyclosporine level, 266.  Therapeutic range -Continue Foley catheter -pt is on unasyn (received 1 dose of Rocephin earlier in ED)  Essential hypertension - IV hydralazine as needed -Amlodipine, Coreg, hydralazine -Hold torsemide   Seizure-like activity (HCC) - Seizure precaution -As needed Ativan for seizure -Continue home lacosamide   HLD (hyperlipidemia) - Lipitor   Type II diabetes mellitus with renal manifestations (HCC) Recent A1c 5.2, well controlled.  Patient is taking Tradjenta at home -Sliding scale insulin   BPH (benign prostatic hyperplasia) - Flomax and Proscar   Nausea & vomiting Possibly due to UTI.  -As needed Zofran  Depression - Continue home medications   UTI (urinary tract infection) -on IV Unasyn (received 1 dose of Rocephin in ED) -Follow-up urine culture   Chronic  a-fib ([place]) Heart rate 89 -Continue Eliquis, Coreg  DVT prophylaxis: Eliquis, currently on hold considering acute anemia Code Status: Full Family Communication:  Attempted to call Ermalinda Memos [phone] on 8/27.  No answer.  Voicemail not left on generic message. Disposition Plan: Status is: Inpatient Remains inpatient appropriate because: AKI on CKD.  Acute on chronic anemia requiring transfusion   Level of care: Med-Surg  Consultants:  Nephrology  Procedures:  None  Antimicrobials: Unasyn   Subjective: Seen and examined.  Resting in bed.  No visible distress.  No complaints of pain  Objective: Vitals:   09/04/21 0843 09/04/21 0849 09/04/21 0945 09/04/21 1001  BP:  (!) 145/56 (!) 158/76 (!) 149/60  Pulse:  68 68 65  Resp:  '18 18 18  '$ Temp:  97.7 F (36.5 C) 98 F (36.7 C) 98 F (36.7 C)  TempSrc:  Oral Oral Oral  SpO2: 99% 100%  100%  Weight:      Height:        Intake/Output Summary (Last 24 hours) at 09/04/2021 1033 Last data filed at 09/04/2021 0945 Gross per 24 hour  Intake 2021.2 ml  Output 1500 ml  Net 521.2 ml   Filed Weights   09/01/21 1936  Weight: 67.1 kg    Examination:  General exam: N/A ED Respiratory system: Clear to auscultation. Respiratory effort normal. Cardiovascular system: S1-S2, RRR, no murmurs, no pedal edema Gastrointestinal system: Soft, NT/ND, normal bowel sounds Central nervous system: AAO x3, no focal deficits Skin: Blistering on right wrist Psychiatry: Judgement and insight appear normal. Mood & affect appropriate.     Data Reviewed: I have personally reviewed following labs and imaging studies  CBC: Recent Labs  Lab 09/01/21 1952 09/03/21 0635 09/03/21 1958  WBC 10.6* 5.9  --   HGB 7.1* 5.8* 6.2*  HCT 23.9* 19.0* 20.3*  MCV 77.9* 76.3*  --   PLT 169 160  --    Basic Metabolic Panel: Recent Labs  Lab 09/01/21 1952 09/02/21 2012 09/03/21 0635  NA 141  --  142  K 5.7* 4.7 4.8  CL 108  --  111  CO2 23  --  25  GLUCOSE 154*  --  98  BUN 60*  --  65*  CREATININE 4.31*  --  4.48*  CALCIUM 8.7*  --  8.4*   GFR: Estimated Creatinine Clearance: 15.8 mL/min (A) (by  C-G formula based on SCr of 4.48 mg/dL (H)). Liver Function Tests: Recent Labs  Lab 09/01/21 1952  AST 18  ALT 9  ALKPHOS 46  BILITOT 0.9  PROT 7.3  ALBUMIN 3.2*   Recent Labs  Lab 09/01/21 1952  LIPASE 22   No results for input(s): "AMMONIA" in the last 168 hours. Coagulation Profile: No results for input(s): "INR", "PROTIME" in the last 168 hours. Cardiac Enzymes: No results for input(s): "CKTOTAL", "CKMB", "CKMBINDEX", "TROPONINI" in the last 168 hours. BNP (last 3 results) No results for input(s): "PROBNP" in the last 8760 hours. HbA1C: No results for input(s): "HGBA1C" in the last 72 hours. CBG: Recent Labs  Lab 09/03/21 0821 09/03/21 1144 09/03/21 1555 09/03/21 1558 09/03/21 2048  GLUCAP 118* 100* 114* 116* 124*   Lipid Profile: No results for input(s): "CHOL", "HDL", "LDLCALC", "TRIG", "CHOLHDL", "LDLDIRECT" in the last 72 hours. Thyroid Function Tests: No results for input(s): "TSH", "T4TOTAL", "FREET4", "T3FREE", "THYROIDAB" in the last 72 hours. Anemia Panel: No results for input(s): "VITAMINB12", "FOLATE", "FERRITIN", "TIBC", "IRON", "RETICCTPCT" in the last 72 hours. Sepsis Labs: Recent Labs  Lab 09/02/21 0458 09/02/21 0637  PROCALCITON  --  0.39  LATICACIDVEN 0.7 0.6    Recent Results (from the past 240 hour(s))  SARS Coronavirus 2 by RT PCR (hospital order, performed in [facility] hospital lab) *cepheid single result test* Anterior Nasal Swab     Status: None   Collection Time: 09/02/21  3:04 AM   Specimen: Anterior Nasal Swab  Result Value Ref Range Status   SARS Coronavirus 2 by RT PCR NEGATIVE NEGATIVE Final    Comment: (NOTE) SARS-CoV-2 target nucleic acids are NOT DETECTED.  The SARS-CoV-2 RNA is generally detectable in upper and lower respiratory specimens during the acute phase of infection. The lowest concentration of SARS-CoV-2 viral copies this assay can detect is 250 copies / mL. A negative result does not preclude SARS-CoV-2  infection and should not be used as the sole basis for treatment or other patient management decisions.  A negative result may occur with improper specimen collection / handling, submission of specimen other than nasopharyngeal swab, presence of viral mutation(s) within the areas targeted by this assay, and inadequate number of viral copies (<250 copies / mL). A negative result must be combined with clinical observations, patient history, and epidemiological information.  Fact Sheet for Patients:   https://www.patel.info/  Fact Sheet for Healthcare Providers: https://hall.com/  This test is not yet approved or  cleared by the [place] FDA and has been authorized for detection and/or diagnosis of SARS-CoV-2 by FDA under an Emergency Use Authorization (EUA).  This EUA will remain in effect (meaning this test can be used) for the duration of the COVID-19 declaration under Section 564(b)(1) of the Act, 21 U.S.C. section 360bbb-3(b)(1), unless the authorization is terminated or revoked sooner.  Performed at [facility], [address] [place], [place] [address]   Culture, blood (routine x 2)     Status: None (Preliminary result)   Collection Time: 09/02/21  4:58 AM   Specimen: BLOOD  Result Value Ref Range Status   Specimen Description BLOOD RIGHT [facility]  Final   Special Requests   Final    BOTTLES DRAWN AEROBIC AND ANAEROBIC Blood Culture results may not be optimal due to an excessive volume of blood received in culture bottles   Culture   Final    NO GROWTH 2 DAYS Performed at [facility], [address] [place], [place] [address]    Report Status PENDING  Incomplete  Culture, blood (routine x 2)     Status: None (Preliminary result)   Collection Time: 09/02/21  6:37 AM   Specimen: BLOOD RIGHT HAND  Result Value Ref Range Status   Specimen Description BLOOD RIGHT HAND  Final   Special Requests   Final     BOTTLES DRAWN AEROBIC AND ANAEROBIC Blood Culture adequate volume   Culture   Final    NO GROWTH 2 DAYS Performed at [facility], [address] [place], [place] [address]    Report Status PENDING  Incomplete  Urine Culture     Status: Abnormal (Preliminary result)   Collection Time: 09/02/21  5:30 PM   Specimen: Urine, Clean Catch  Result Value Ref Range Status   Specimen Description   Final    URINE, CLEAN CATCH Performed at [facility], [address] [place], [place] [address]    Special Requests   Final    NONE Performed at [facility], [address] [place],  [address]    Culture (A)  Final    >=  100,000 COLONIES/mL ENTEROCOCCUS FAECALIS SUSCEPTIBILITIES TO FOLLOW Performed at [place] [address], [place], [place] [address]    Report Status PENDING  Incomplete         Radiology Studies: US Venous Img Upper Uni Right(DVT)  Result Date: 09/04/2021 CLINICAL DATA:  Dialysis patient with right forearm swelling. EXAM: RIGHT UPPER EXTREMITY VENOUS DOPPLER ULTRASOUND TECHNIQUE: Gray-scale sonography with graded compression, as well as color Doppler and duplex ultrasound were performed to evaluate the upper extremity deep venous system from the level of the subclavian vein and including the jugular, axillary, basilic, radial, ulnar and upper cephalic vein. Spectral Doppler was utilized to evaluate flow at rest and with distal augmentation maneuvers. COMPARISON:  None Available. FINDINGS: Contralateral Subclavian Vein: Respiratory phasicity is normal and symmetric with the symptomatic side. No evidence of thrombus. Normal compressibility. Internal Jugular Vein: No evidence of thrombus. Normal compressibility, respiratory phasicity and response to augmentation. Subclavian Vein: No evidence of thrombus. Normal compressibility, respiratory phasicity and response to augmentation. Axillary Vein: No evidence of thrombus. Normal  compressibility, respiratory phasicity and response to augmentation. Cephalic Vein: No evidence of thrombus. Normal compressibility, respiratory phasicity and response to augmentation. Small in caliber and only well seen distally. Basilic Vein: No evidence of thrombus. Normal compressibility, respiratory phasicity and response to augmentation. Brachial Veins: No evidence of thrombus. Normal compressibility, respiratory phasicity and response to augmentation. Radial Veins: No evidence of thrombus. Normal compressibility, respiratory phasicity and response to augmentation. Ulnar Veins: No evidence of thrombus. Normal compressibility, respiratory phasicity and response to augmentation. Venous Reflux:  None visualized. Other Findings: Direct sonographic imaging over the area of right arm swelling revealed no underlying mass, fluid collection or focal sonographic abnormality. IMPRESSION: No evidence of DVT within the right upper extremity. Electronically Signed   By: Telford Nab M.D.   On: 09/04/2021 05:41   US RENAL  Result Date: 09/02/2021 CLINICAL DATA:  Renal transplant.  ATN. EXAM: RENAL / URINARY TRACT ULTRASOUND COMPLETE COMPARISON:  CT scan of the abdomen and pelvis September 02, 2021. Renal ultrasound March 14, 2021. FINDINGS: Right Kidney: Renal measurements: 7.9 x 3.1 x 4.2 cm = volume: 53 mL. Marked increased cortical echogenicity. Left Kidney: Not visualized. Bladder: Debris in the bladder. Right Transplanted kidney: Renal measurements: 11.6 x 6.1 x 6.4 cm = volume: 230 a mL. Moderate pelvicaliectasis/hydronephrosis, increased since March 14, 2021. Other: None. IMPRESSION: 1. The transplanted kidney in the right lower quadrant/right pelvis demonstrates moderate pelvicaliectasis/hydronephrosis, increased since March 14, 2021 ultrasound imaging. 2. The right kidney is a trophic consistent with history. The left kidney could not be visualized. 3. Mild dependent debris in the bladder. Electronically Signed    By: Dorise Bullion III M.D.   On: 09/02/2021 14:43        Scheduled Meds:  amLODipine  10 mg Oral Daily   ascorbic acid  500 mg Oral Daily   atorvastatin  10 mg Oral Daily   carvedilol  25 mg Oral BID WC   cholecalciferol  2,000 Units Oral Q1400   cycloSPORINE  75 mg Oral BID   DULoxetine  60 mg Oral Daily   ferrous sulfate  325 mg Oral Q breakfast   finasteride  5 mg Oral Daily   fluticasone furoate-vilanterol  1 puff Inhalation Daily   folic acid  1 mg Oral Daily   guaiFENesin  600 mg Oral BID   hydrALAZINE  75 mg Oral TID   insulin aspart  0-5 Units Subcutaneous QHS  insulin aspart  0-9 Units Subcutaneous TID WC   ipratropium-albuterol  3 mL Nebulization TID   lacosamide  150 mg Oral BID   mycophenolate  180 mg Oral BID   polyethylene glycol  17 g Oral Daily   sodium bicarbonate  650 mg Oral TID   tamsulosin  0.8 mg Oral Daily   Continuous Infusions:  ampicillin-sulbactam (UNASYN) IV 3 g (09/03/21 1419)     LOS: 2 days       Sidney Ace, MD [facility]   If 7PM-7AM, please contact night-coverage  09/04/2021, 10:33 AM

## 2021-09-04 NOTE — Plan of Care (Signed)

## 2021-09-04 NOTE — Progress Notes (Signed)
[facility]  ROUNDING NOTE   Subjective:   Patient seen resting quietly, alert and oriented Patient states he currently follows with the [facility] for nephrology He states that his nephrologist had discussed the possible need for dialysis in the near future Complains of poor appetite and generalized weakness and fatigue  Objective:  Vital signs in last 24 hours:  Temp:  [97.4 F (36.3 C)-98.6 F (37 C)] 98 F (36.7 C) (08/28 1001) Pulse Rate:  [59-72] 65 (08/28 1001) Resp:  [15-19] 18 (08/28 1001) BP: (138-160)/(44-82) 149/60 (08/28 1001) SpO2:  [98 %-100 %] 100 % (08/28 1001)  Weight change:  Filed Weights   09/01/21 1936  Weight: 67.1 kg    Intake/Output: I/O last 3 completed shifts: In: 2211.2 [P.O.:960; Blood:1251.2] Out: 2050 [Urine:2050]   Intake/Output this shift:  Total I/O In: 650 [P.O.:360; Blood:290] Out: -   Physical Exam: General: NAD  Head: Normocephalic, atraumatic. Moist oral mucosal membranes  Eyes: Anicteric  Lungs:  Clear to auscultation, normal effort,   Heart: Regular rate and rhythm  Abdomen:  Soft, nontender  Extremities:  1+ peripheral edema.  Neurologic: Nonfocal, moving all four extremities  Skin: No lesions  Access: Lt AVF    Basic Metabolic Panel: Recent Labs  Lab 09/01/21 1952 09/02/21 2012 09/03/21 0635 09/04/21 1022  NA 141  --  142 140  K 5.7* 4.7 4.8 4.8  CL 108  --  111 108  CO2 23  --  25 21*  GLUCOSE 154*  --  98 118*  BUN 60*  --  65* 66*  CREATININE 4.31*  --  4.48* 4.28*  CALCIUM 8.7*  --  8.4* 8.7*    Liver Function Tests: Recent Labs  Lab 09/01/21 1952  AST 18  ALT 9  ALKPHOS 46  BILITOT 0.9  PROT 7.3  ALBUMIN 3.2*   Recent Labs  Lab 09/01/21 1952  LIPASE 22   No results for input(s): "AMMONIA" in the last 168 hours.  CBC: Recent Labs  Lab 09/01/21 1952 09/03/21 0635 09/03/21 1958 09/04/21 1022 09/04/21 1352  WBC 10.6* 5.9  --  4.8  --   NEUTROABS  --   --   --  4.0  --   HGB  7.1* 5.8* 6.2* 8.4* 8.0*  HCT 23.9* 19.0* 20.3* 27.0* 25.7*  MCV 77.9* 76.3*  --  76.5*  --   PLT 169 160  --  236  --     Cardiac Enzymes: No results for input(s): "CKTOTAL", "CKMB", "CKMBINDEX", "TROPONINI" in the last 168 hours.  BNP: Invalid input(s): "POCBNP"  CBG: Recent Labs  Lab 09/03/21 1144 09/03/21 1555 09/03/21 1558 09/03/21 2048 09/04/21 1159  GLUCAP 100* 114* 116* 124* 166*    Microbiology: Results for orders placed or performed during the hospital encounter of 09/02/21  SARS Coronavirus 2 by RT PCR (hospital order, performed in [facility] hospital lab) *cepheid single result test* Anterior Nasal Swab     Status: None   Collection Time: 09/02/21  3:04 AM   Specimen: Anterior Nasal Swab  Result Value Ref Range Status   SARS Coronavirus 2 by RT PCR NEGATIVE NEGATIVE Final    Comment: (NOTE) SARS-CoV-2 target nucleic acids are NOT DETECTED.  The SARS-CoV-2 RNA is generally detectable in upper and lower respiratory specimens during the acute phase of infection. The lowest concentration of SARS-CoV-2 viral copies this assay can detect is 250 copies / mL. A negative result does not preclude SARS-CoV-2 infection and should not be used  as the sole basis for treatment or other patient management decisions.  A negative result may occur with improper specimen collection / handling, submission of specimen other than nasopharyngeal swab, presence of viral mutation(s) within the areas targeted by this assay, and inadequate number of viral copies (<250 copies / mL). A negative result must be combined with clinical observations, patient history, and epidemiological information.  Fact Sheet for Patients:   https://www.patel.info/  Fact Sheet for Healthcare Providers: https://hall.com/  This test is not yet approved or  cleared by the [place] FDA and has been authorized for detection and/or diagnosis of SARS-CoV-2  by FDA under an Emergency Use Authorization (EUA).  This EUA will remain in effect (meaning this test can be used) for the duration of the COVID-19 declaration under Section 564(b)(1) of the Act, 21 U.S.C. section 360bbb-3(b)(1), unless the authorization is terminated or revoked sooner.  Performed at [facility], [address] [place], [place] [address]   Culture, blood (routine x 2)     Status: None (Preliminary result)   Collection Time: 09/02/21  4:58 AM   Specimen: BLOOD  Result Value Ref Range Status   Specimen Description BLOOD RIGHT [facility]  Final   Special Requests   Final    BOTTLES DRAWN AEROBIC AND ANAEROBIC Blood Culture results may not be optimal due to an excessive volume of blood received in culture bottles   Culture   Final    NO GROWTH 2 DAYS Performed at [facility], [address] [place], [place] [address]    Report Status PENDING  Incomplete  Culture, blood (routine x 2)     Status: None (Preliminary result)   Collection Time: 09/02/21  6:37 AM   Specimen: BLOOD RIGHT HAND  Result Value Ref Range Status   Specimen Description BLOOD RIGHT HAND  Final   Special Requests   Final    BOTTLES DRAWN AEROBIC AND ANAEROBIC Blood Culture adequate volume   Culture   Final    NO GROWTH 2 DAYS Performed at [facility], [address] [place], [place] [address]    Report Status PENDING  Incomplete  Urine Culture     Status: Abnormal (Preliminary result)   Collection Time: 09/02/21  5:30 PM   Specimen: Urine, Clean Catch  Result Value Ref Range Status   Specimen Description   Final    URINE, CLEAN CATCH Performed at [facility], [address] [place], [place] [address]    Special Requests   Final    NONE Performed at [facility], [address], [place], [place] [address]    Culture (A)  Final    >=100,000 COLONIES/mL ENTEROCOCCUS FAECALIS SUSCEPTIBILITIES TO FOLLOW Performed at [facility],  [place] [address], [place], [place] [address]    Report Status PENDING  Incomplete    Coagulation Studies: No results for input(s): "LABPROT", "INR" in the last 72 hours.  Urinalysis: Recent Labs    09/01/21 1952  COLORURINE YELLOW*  LABSPEC 1.011  PHURINE 7.0  GLUCOSEU NEGATIVE  HGBUR NEGATIVE  BILIRUBINUR NEGATIVE  KETONESUR NEGATIVE  PROTEINUR 100*  NITRITE NEGATIVE  LEUKOCYTESUR LARGE*      Imaging: US Venous Img Upper Uni Right(DVT)  Result Date: 09/04/2021 CLINICAL DATA:  Dialysis patient with right forearm swelling. EXAM: RIGHT UPPER EXTREMITY VENOUS DOPPLER ULTRASOUND TECHNIQUE: Gray-scale sonography with graded compression, as well as color Doppler and duplex ultrasound were performed to evaluate the upper extremity deep venous system from the  level of the subclavian vein and including the jugular, axillary, basilic, radial, ulnar and upper cephalic vein. Spectral Doppler was utilized to evaluate flow at rest and with distal augmentation maneuvers. COMPARISON:  None Available. FINDINGS: Contralateral Subclavian Vein: Respiratory phasicity is normal and symmetric with the symptomatic side. No evidence of thrombus. Normal compressibility. Internal Jugular Vein: No evidence of thrombus. Normal compressibility, respiratory phasicity and response to augmentation. Subclavian Vein: No evidence of thrombus. Normal compressibility, respiratory phasicity and response to augmentation. Axillary Vein: No evidence of thrombus. Normal compressibility, respiratory phasicity and response to augmentation. Cephalic Vein: No evidence of thrombus. Normal compressibility, respiratory phasicity and response to augmentation. Small in caliber and only well seen distally. Basilic Vein: No evidence of thrombus. Normal compressibility, respiratory phasicity and response to augmentation. Brachial Veins: No evidence of thrombus. Normal compressibility, respiratory phasicity and response to augmentation. Radial Veins:  No evidence of thrombus. Normal compressibility, respiratory phasicity and response to augmentation. Ulnar Veins: No evidence of thrombus. Normal compressibility, respiratory phasicity and response to augmentation. Venous Reflux:  None visualized. Other Findings: Direct sonographic imaging over the area of right arm swelling revealed no underlying mass, fluid collection or focal sonographic abnormality. IMPRESSION: No evidence of DVT within the right upper extremity. Electronically Signed   By: Telford Nab M.D.   On: 09/04/2021 05:41     Medications:    ampicillin-sulbactam (UNASYN) IV 3 g (09/04/21 1253)    amLODipine  10 mg Oral Daily   ascorbic acid  500 mg Oral Daily   atorvastatin  10 mg Oral Daily   carvedilol  25 mg Oral BID WC   cholecalciferol  2,000 Units Oral Q1400   cycloSPORINE  75 mg Oral BID   DULoxetine  60 mg Oral Daily   ferrous sulfate  325 mg Oral Q breakfast   finasteride  5 mg Oral Daily   fluticasone furoate-vilanterol  1 puff Inhalation Daily   folic acid  1 mg Oral Daily   guaiFENesin  600 mg Oral BID   hydrALAZINE  75 mg Oral TID   insulin aspart  0-5 Units Subcutaneous QHS   insulin aspart  0-9 Units Subcutaneous TID WC   ipratropium-albuterol  3 mL Nebulization TID   lacosamide  150 mg Oral BID   mycophenolate  180 mg Oral BID   polyethylene glycol  17 g Oral Daily   sodium bicarbonate  650 mg Oral TID   tamsulosin  0.8 mg Oral Daily   acetaminophen **OR** acetaminophen, albuterol, diphenhydrAMINE, hydrALAZINE, LORazepam, magnesium hydroxide, ondansetron **OR** ondansetron (ZOFRAN) IV, traZODone  Assessment/ Plan:  Mr. Jared Gregory is a 66 y.o.  male with a past medical history of end-stage renal disease, s/p renal transplant, history of MGUS, hypertension, hyperlipidemia, diabetes mellitus type 2, secondary hyperparathyroidism, seizures, coronary artery disease who was brought to the ER with chief complaint of weakness.  Acute Kidney Injury with  hyperkalemia on chronic kidney disease stage IV with baseline creatinine 4.1 on 08/09/21.  Acute kidney injury secondary to hypovolemia and anemia, hemoglobin less than 6 Chronic kidney disease is secondary to diabetes and hypertension Renal ultrasound negative for obstruction.  Patient received cadaver renal transplant in April 2017. [facility] and has been told during recent appointments that his kidney function is low and he will need to reinitiate dialysis treatments in the near future. Patient continues to have poor renal function. Though adequate urine output recorded, BUN and creatinine continues to rise. No acute need for dialysis, but will continue  to monitor labs. Spoke with patient about this and if labs continue to worsen, we may need to start dialysis during this admission. Potassium treated with hyperkalemia protocol, 4.8 today.  Lab Results  Component Value Date   CREATININE 4.28 (H) 09/04/2021   CREATININE 4.48 (H) 09/03/2021   CREATININE 4.31 (H) 09/01/2021    Intake/Output Summary (Last 24 hours) at 09/04/2021 1452 Last data filed at 09/04/2021 1105 Gross per 24 hour  Intake 1288 ml  Output 1300 ml  Net -12 ml   2. Anemia of chronic kidney disease Lab Results  Component Value Date   HGB 8.0 (L) 09/04/2021   Received blood transfusions during this admission. Hgb improved but below target, will monitor and consider ESA's  3. Secondary Hyperparathyroidism: with outpatient labs: PTH 100.9 on 06/22/21.   Lab Results  Component Value Date   CALCIUM 8.7 (L) 09/04/2021   PHOS 3.4 01/18/2021    Calcium and phosphorus within acceptable range  4. Diabetes mellitus type II with chronic kidney disease/renal manifestations: noninsulin dependent.Most recent hemoglobin A1c is 5.2 on 01/11/21.     LOS: 2   8/28/20232:52 PM

## 2021-09-05 DIAGNOSIS — J690 Pneumonitis due to inhalation of food and vomit: Secondary | ICD-10-CM | POA: Diagnosis not present

## 2021-09-05 LAB — TYPE AND SCREEN
ABO/RH(D): B POS
Antibody Screen: NEGATIVE
Unit division: 0
Unit division: 0
Unit division: 0
Unit division: 0

## 2021-09-05 LAB — CYCLOSPORINE: Cyclosporine, LabCorp: 125 ng/mL (ref 100–400)

## 2021-09-05 LAB — CBC
HCT: 26.9 % — ABNORMAL LOW (ref 39.0–52.0)
Hemoglobin: 8.4 g/dL — ABNORMAL LOW (ref 13.0–17.0)
MCH: 23.9 pg — ABNORMAL LOW (ref 26.0–34.0)
MCHC: 31.2 g/dL (ref 30.0–36.0)
MCV: 76.6 fL — ABNORMAL LOW (ref 80.0–100.0)
Platelets: 209 10*3/uL (ref 150–400)
RBC: 3.51 MIL/uL — ABNORMAL LOW (ref 4.22–5.81)
RDW: 20.3 % — ABNORMAL HIGH (ref 11.5–15.5)
WBC: 6 10*3/uL (ref 4.0–10.5)
nRBC: 0 % (ref 0.0–0.2)

## 2021-09-05 LAB — GLUCOSE, CAPILLARY
Glucose-Capillary: 90 mg/dL (ref 70–99)
Glucose-Capillary: 159 mg/dL — ABNORMAL HIGH (ref 70–99)
Glucose-Capillary: 111 mg/dL — ABNORMAL HIGH (ref 70–99)
Glucose-Capillary: 128 mg/dL — ABNORMAL HIGH (ref 70–99)

## 2021-09-05 LAB — RENAL FUNCTION PANEL
Albumin: 3.1 g/dL — ABNORMAL LOW (ref 3.5–5.0)
Anion gap: 8 (ref 5–15)
BUN: 64 mg/dL — ABNORMAL HIGH (ref 8–23)
CO2: 21 mmol/L — ABNORMAL LOW (ref 22–32)
Calcium: 8.8 mg/dL — ABNORMAL LOW (ref 8.9–10.3)
Chloride: 112 mmol/L — ABNORMAL HIGH (ref 98–111)
Creatinine, Ser: 4.18 mg/dL — ABNORMAL HIGH (ref 0.61–1.24)
GFR, Estimated: 15 mL/min — ABNORMAL LOW (ref >60)
Glucose, Bld: 95 mg/dL (ref 70–99)
Phosphorus: 4.5 mg/dL (ref 2.5–4.6)
Potassium: 4.9 mmol/L (ref 3.5–5.1)
Sodium: 141 mmol/L (ref 135–145)

## 2021-09-05 LAB — BPAM RBC
Blood Product Expiration Date: 202309202359
Blood Product Expiration Date: 202309222359
Blood Product Expiration Date: 202309222359
Blood Product Expiration Date: 202309222359
ISSUE DATE / TIME: 202308271102
ISSUE DATE / TIME: 202308271513
ISSUE DATE / TIME: 202308280245
ISSUE DATE / TIME: 202308280706
Unit Type and Rh: 7300
Unit Type and Rh: 7300
Unit Type and Rh: 7300
Unit Type and Rh: 7300

## 2021-09-05 LAB — URINE CULTURE: Culture: >=100000 — AB

## 2021-09-05 LAB — LEGIONELLA PNEUMOPHILA SEROGP 1 UR AG: L. pneumophila Serogp 1 Ur Ag: NEGATIVE

## 2021-09-05 LAB — HEPATITIS B SURFACE ANTIGEN: Hepatitis B Surface Ag: NONREACTIVE

## 2021-09-05 MED ORDER — APIXABAN 5 MG PO TABS
5.0000 mg | ORAL_TABLET | Freq: Two times a day (BID) | ORAL | Status: DC
  Administered 2021-09-05 – 2021-09-06 (×2): 5 mg via ORAL
  Filled 2021-09-05 (×2): qty 1

## 2021-09-05 MED ORDER — CHLORHEXIDINE GLUCONATE CLOTH 2 % EX PADS
6.0000 | MEDICATED_PAD | Freq: Every day | CUTANEOUS | Status: DC
Start: 2021-09-05 — End: 2021-09-06
  Administered 2021-09-05: 6 via TOPICAL

## 2021-09-05 NOTE — Progress Notes (Signed)
Pharmacy Antibiotic Note  Jared Gregory is a 66 y.o. male admitted on 09/02/2021 with pneumonia and UTI.  Pharmacy has been consulted for Unasyn dosing.  Assessment: Pt is a 65 yo M with PMH kidney transplant on immunosuppressants, HTN, HLD, T2DM (A1c 5.2%), COPD, depression with anxiety, Afib (on eliquis), CKD4 (BL Scr 3.8-4.1), MGUS. Found to have aspiration pneumonia and UTI. CXR showed small pleural effusions with opacities that most likely reflect superimposed multifocal pneumonia. UA was notable for >50 WBC. Ucx growing >100,000 CFU/mL pan-sensitive E. Faecalis. COVID PCR negative. Bcx NGTD @ <3 days. Sputum culture sent. Pt is afebrile, VSS, with WBC 6.0. Pt's Scr is 4.2, which is slightly above baseline of 3.8-4.1. Today is day #4 of abx.  Plan: Continue Unasyn 3 g IV q12H for a total of 10 doses. Continue to monitor vital signs and WBCs to assess for efficacy of current antibiotics Continue to monitor renal function for any changes that would require adjusting Unasyn frequency  Height: '5\' 9"'$  (175.3 cm) Weight: 67.1 kg (148 lb) IBW/kg (Calculated) : 70.7  Temp (24hrs), Avg:98 F (36.7 C), Min:97.4 F (36.3 C), Max:98.4 F (36.9 C)  Recent Labs  Lab 09/01/21 1952 09/02/21 0458 09/02/21 0637 09/03/21 0635 09/04/21 1022 09/05/21 0645  WBC 10.6*  --   --  5.9 4.8 6.0  CREATININE 4.31*  --   --  4.48* 4.28* 4.18*  LATICACIDVEN  --  0.7 0.6  --   --   --     Estimated Creatinine Clearance: 16.9 mL/min (A) (by C-G formula based on SCr of 4.18 mg/dL (H)).    Allergies  Allergen Reactions   Baclofen    Lisinopril    Remeron [Mirtazapine]    Vancomycin    Zofran [Ondansetron]     Antimicrobials this admission: Ceftriaxone 8/26 x 1 Azithromycin 8/26 x 1 Unasyn 8/27 >>   Microbiology results: 8/26 BCx: NGTD @ 3 days 8/26 UCx: 100,000 CFU/mL pan-sensitive E. faecalis  8/26 Sputum: sent  Thank you for allowing pharmacy to be a part of this patient's care.  Dara Hoyer, PharmD PGY-1 Pharmacy Resident 09/05/2021 2:53 PM

## 2021-09-05 NOTE — Progress Notes (Signed)
PROGRESS NOTE    Jared Gregory  UJW:[id number] DOB: [birth year] DOA: 09/02/2021 PCP: [facility]    Brief Narrative:  66 y.o. male with medical history significant of s/p of kidney transplantation on immunosuppressants, HTN, HLD, DM, depression with anxiety, atrial fibrillation on Eliquis, BPH, anemia, CKD-4,  MGUS, who presents with nausea vomiting and cough.   Patient states that he has nausea vomiting for 2 days.  He has several episodes of nonbilious nonbloody vomiting each day.  He developed dry cough and mild shortness breath today.  No chest pain.  No fever or chills.  Patient is now late using oxygen only at night as needed.  He was found to have oxygen desaturation to 91% on room air, which improved to 95% on 2 L oxygen.  Patient does not have symptoms of UTI.  No unilateral numbness or tinglings extremities.  8/27: Hemoglobin worsened to 5.8.  Receiving 2 units PRBC.  Cyclosporine level pending.  Case discussed with nephrology.  Patient will likely need renal replacement therapy soon.  Creatinine worsening over interval.  8/29: Hemoglobin is remained stable at 8.4.    Assessment & Plan:   Principal Problem:   Aspiration pneumonia ([place]) Active Problems:   Hyperkalemia   CKD (chronic kidney disease), stage IV (HCC)   Renal transplant recipient   Essential hypertension   Seizure-like activity (HCC)   HLD (hyperlipidemia)   Type II diabetes mellitus with renal manifestations (HCC)   BPH (benign prostatic hyperplasia)   Nausea & vomiting   Anemia in chronic kidney disease (CKD)   Depression   Chronic a-fib (HCC)   UTI (urinary tract infection)  Acute on chronic anemia of chronic kidney disease Unclear etiology of acute on chronic anemia.  No clear evidence of blood loss.  No change in hemodynamics. Hemoglobin 7.1 on presentation, decreased to 5.8 Transfusion delayed by lack of IV access Transfused 2 units PRBC Hemoglobin has been stable at 8.4 Plan: No  further transfusion at this time Daily CBC  Aspiration pneumonia [facility])  Patient has 2 L oxygen requirement, does not meet criteria for sepsis Plan: Continue Unasyn, 5-day stop date Bronchodilators Mucolytic's and antitussives Follow cultures, no growth to date  Hyperkalemia Potassium 5.7 -Treated with D50, 8 units of NovoLog, 10 g of leukoma, now improved   CKD (chronic kidney disease), stage IV (HCC) Worsening baseline.  Recent baseline creatinine 3.76, BUN 4.31 -Consulted Dr.  Theador Hawthorne of nephrology Plan: -Hold torsemide -Patient may need renal replacement therapy -Daily BMP -Nephrology follow-up   Renal transplant recipient Patient is taking mycophenolate and cyclosporine.  US-renal showed transplanted kidney with moderate pelvicaliectasis/hydronephrosis, increased since March 14, 2021 ultrasound imaging. Bladder scan showed 200 retention. No obstructive stone. UA is positive for UTI Plan: -Continue home mycophenolate and cyclosporine -Check cyclosporine level, 266.  Therapeutic range -Continue Foley catheter -pt is on unasyn (received 1 dose of Rocephin earlier in ED)  Essential hypertension - IV hydralazine as needed -Amlodipine, Coreg, hydralazine -Hold torsemide   Seizure-like activity (HCC) - Seizure precaution -As needed Ativan for seizure -Continue home lacosamide   HLD (hyperlipidemia) - Lipitor   Type II diabetes mellitus with renal manifestations (HCC) Recent A1c 5.2, well controlled.  Patient is taking Tradjenta at home -Sliding scale insulin   BPH (benign prostatic hyperplasia) - Flomax and Proscar   Nausea & vomiting Possibly due to UTI.  -As needed Zofran  Depression - Continue home medications   UTI (urinary tract infection) -on IV Unasyn (received 1 dose of  Rocephin in ED) -Follow-up urine culture   Chronic a-fib (HCC) Heart rate 89 -Continue Eliquis, Coreg  DVT prophylaxis: Eliquis, resumed 8/29 Code Status: Full Family  Communication: Attempted to call Ermalinda Memos [phone] on 8/27.  Second attempt to reach sister.  No answer.  Voicemail left Disposition Plan: Status is: Inpatient Remains inpatient appropriate because: AKI on CKD.  May require renal replacement therapy.  Aspiration pneumonia on IV antibiotics.   Level of care: Med-Surg  Consultants:  Nephrology  Procedures:  None  Antimicrobials: Unasyn   Subjective: Seen and examined.  Resting in bed.  No visible distress.  No complaints of pain  Objective: Vitals:   09/04/21 2248 09/05/21 0316 09/05/21 0831 09/05/21 0843  BP: 129/65 (!) 138/56 (!) 147/54 (!) 147/74  Pulse: (!) 59 67  66  Resp:  18    Temp:  98.4 F (36.9 C)    TempSrc:      SpO2:  100%    Weight:      Height:        Intake/Output Summary (Last 24 hours) at 09/05/2021 1340 Last data filed at 09/05/2021 1240 Gross per 24 hour  Intake 780 ml  Output 1650 ml  Net -870 ml   Filed Weights   09/01/21 1936  Weight: 67.1 kg    Examination:  General exam: No acute distress Respiratory system: Bibasilar crackles.  Normal work of breathing.  Room air Cardiovascular system: S1-S2, RRR, no murmurs, no pedal edema Gastrointestinal system: Soft, NT/ND, normal bowel sounds Central nervous system: AAO x3, no focal deficits Skin: Blistering on right wrist Psychiatry: Judgement and insight appear normal. Mood & affect appropriate.     Data Reviewed: I have personally reviewed following labs and imaging studies  CBC: Recent Labs  Lab 09/01/21 1952 09/03/21 0635 09/03/21 1958 09/04/21 1022 09/04/21 1352 09/05/21 0645  WBC 10.6* 5.9  --  4.8  --  6.0  NEUTROABS  --   --   --  4.0  --   --   HGB 7.1* 5.8* 6.2* 8.4* 8.0* 8.4*  HCT 23.9* 19.0* 20.3* 27.0* 25.7* 26.9*  MCV 77.9* 76.3*  --  76.5*  --  76.6*  PLT 169 160  --  236  --  681   Basic Metabolic Panel: Recent Labs  Lab 09/01/21 1952 09/02/21 2012 09/03/21 0635 09/04/21 1022 09/05/21 0645  NA 141   --  142 140 141  K 5.7* 4.7 4.8 4.8 4.9  CL 108  --  111 108 112*  CO2 23  --  25 21* 21*  GLUCOSE 154*  --  98 118* 95  BUN 60*  --  65* 66* 64*  CREATININE 4.31*  --  4.48* 4.28* 4.18*  CALCIUM 8.7*  --  8.4* 8.7* 8.8*  PHOS  --   --   --   --  4.5   GFR: Estimated Creatinine Clearance: 16.9 mL/min (A) (by C-G formula based on SCr of 4.18 mg/dL (H)). Liver Function Tests: Recent Labs  Lab 09/01/21 1952 09/05/21 0645  AST 18  --   ALT 9  --   ALKPHOS 46  --   BILITOT 0.9  --   PROT 7.3  --   ALBUMIN 3.2* 3.1*   Recent Labs  Lab 09/01/21 1952  LIPASE 22   No results for input(s): "AMMONIA" in the last 168 hours. Coagulation Profile: No results for input(s): "INR", "PROTIME" in the last 168 hours. Cardiac Enzymes: No results for input(s): "CKTOTAL", "CKMB", "CKMBINDEX", "  TROPONINI" in the last 168 hours. BNP (last 3 results) No results for input(s): "PROBNP" in the last 8760 hours. HbA1C: No results for input(s): "HGBA1C" in the last 72 hours. CBG: Recent Labs  Lab 09/04/21 1159 09/04/21 1825 09/04/21 2044 09/05/21 0807 09/05/21 1117  GLUCAP 166* 136* 132* 90 159*   Lipid Profile: No results for input(s): "CHOL", "HDL", "LDLCALC", "TRIG", "CHOLHDL", "LDLDIRECT" in the last 72 hours. Thyroid Function Tests: No results for input(s): "TSH", "T4TOTAL", "FREET4", "T3FREE", "THYROIDAB" in the last 72 hours. Anemia Panel: No results for input(s): "VITAMINB12", "FOLATE", "FERRITIN", "TIBC", "IRON", "RETICCTPCT" in the last 72 hours. Sepsis Labs: Recent Labs  Lab 09/02/21 0458 09/02/21 0637  PROCALCITON  --  0.39  LATICACIDVEN 0.7 0.6    Recent Results (from the past 240 hour(s))  SARS Coronavirus 2 by RT PCR (hospital order, performed in [facility] hospital lab) *cepheid single result test* Anterior Nasal Swab     Status: None   Collection Time: 09/02/21  3:04 AM   Specimen: Anterior Nasal Swab  Result Value Ref Range Status   SARS Coronavirus 2 by RT PCR  NEGATIVE NEGATIVE Final    Comment: (NOTE) SARS-CoV-2 target nucleic acids are NOT DETECTED.  The SARS-CoV-2 RNA is generally detectable in upper and lower respiratory specimens during the acute phase of infection. The lowest concentration of SARS-CoV-2 viral copies this assay can detect is 250 copies / mL. A negative result does not preclude SARS-CoV-2 infection and should not be used as the sole basis for treatment or other patient management decisions.  A negative result may occur with improper specimen collection / handling, submission of specimen other than nasopharyngeal swab, presence of viral mutation(s) within the areas targeted by this assay, and inadequate number of viral copies (<250 copies / mL). A negative result must be combined with clinical observations, patient history, and epidemiological information.  Fact Sheet for Patients:   https://www.patel.info/  Fact Sheet for Healthcare Providers: https://hall.com/  This test is not yet approved or  cleared by the [place] FDA and has been authorized for detection and/or diagnosis of SARS-CoV-2 by FDA under an Emergency Use Authorization (EUA).  This EUA will remain in effect (meaning this test can be used) for the duration of the COVID-19 declaration under Section 564(b)(1) of the Act, 21 U.S.C. section 360bbb-3(b)(1), unless the authorization is terminated or revoked sooner.  Performed at [facility], [address] [place], [place] [address]   Culture, blood (routine x 2)     Status: None (Preliminary result)   Collection Time: 09/02/21  4:58 AM   Specimen: BLOOD  Result Value Ref Range Status   Specimen Description BLOOD RIGHT [facility]  Final   Special Requests   Final    BOTTLES DRAWN AEROBIC AND ANAEROBIC Blood Culture results may not be optimal due to an excessive volume of blood received in culture bottles   Culture   Final    NO GROWTH 3 DAYS Performed  at [facility], [address], [place], [place] [address]    Report Status PENDING  Incomplete  Culture, blood (routine x 2)     Status: None (Preliminary result)   Collection Time: 09/02/21  6:37 AM   Specimen: BLOOD RIGHT HAND  Result Value Ref Range Status   Specimen Description BLOOD RIGHT HAND  Final   Special Requests   Final    BOTTLES DRAWN AEROBIC AND ANAEROBIC Blood Culture adequate volume   Culture   Final  NO GROWTH 3 DAYS Performed at [facility], [address] [place], [place] [address]    Report Status PENDING  Incomplete  Urine Culture     Status: Abnormal   Collection Time: 09/02/21  5:30 PM   Specimen: Urine, Clean Catch  Result Value Ref Range Status   Specimen Description   Final    URINE, CLEAN CATCH Performed at [facility], [address] [place], [place] [address]    Special Requests   Final    NONE Performed at [facility], [address] [place], [place] [address]    Culture >=100,000 COLONIES/mL ENTEROCOCCUS FAECALIS (A)  Final   Report Status 09/05/2021 FINAL  Final   Organism ID, Bacteria ENTEROCOCCUS FAECALIS (A)  Final      Susceptibility   Enterococcus faecalis - MIC*    AMPICILLIN <=2 SENSITIVE Sensitive     NITROFURANTOIN <=16 SENSITIVE Sensitive     VANCOMYCIN 1 SENSITIVE Sensitive     * >=100,000 COLONIES/mL ENTEROCOCCUS FAECALIS         Radiology Studies: US Venous Img Upper Uni Right(DVT)  Result Date: 09/04/2021 CLINICAL DATA:  Dialysis patient with right forearm swelling. EXAM: RIGHT UPPER EXTREMITY VENOUS DOPPLER ULTRASOUND TECHNIQUE: Gray-scale sonography with graded compression, as well as color Doppler and duplex ultrasound were performed to evaluate the upper extremity deep venous system from the level of the subclavian vein and including the jugular, axillary, basilic, radial, ulnar and upper cephalic vein. Spectral Doppler was utilized to evaluate flow at rest and with distal  augmentation maneuvers. COMPARISON:  None Available. FINDINGS: Contralateral Subclavian Vein: Respiratory phasicity is normal and symmetric with the symptomatic side. No evidence of thrombus. Normal compressibility. Internal Jugular Vein: No evidence of thrombus. Normal compressibility, respiratory phasicity and response to augmentation. Subclavian Vein: No evidence of thrombus. Normal compressibility, respiratory phasicity and response to augmentation. Axillary Vein: No evidence of thrombus. Normal compressibility, respiratory phasicity and response to augmentation. Cephalic Vein: No evidence of thrombus. Normal compressibility, respiratory phasicity and response to augmentation. Small in caliber and only well seen distally. Basilic Vein: No evidence of thrombus. Normal compressibility, respiratory phasicity and response to augmentation. Brachial Veins: No evidence of thrombus. Normal compressibility, respiratory phasicity and response to augmentation. Radial Veins: No evidence of thrombus. Normal compressibility, respiratory phasicity and response to augmentation. Ulnar Veins: No evidence of thrombus. Normal compressibility, respiratory phasicity and response to augmentation. Venous Reflux:  None visualized. Other Findings: Direct sonographic imaging over the area of right arm swelling revealed no underlying mass, fluid collection or focal sonographic abnormality. IMPRESSION: No evidence of DVT within the right upper extremity. Electronically Signed   By: Telford Nab M.D.   On: 09/04/2021 05:41        Scheduled Meds:  amLODipine  10 mg Oral Daily   ascorbic acid  500 mg Oral Daily   atorvastatin  10 mg Oral Daily   carvedilol  25 mg Oral BID WC   cholecalciferol  2,000 Units Oral Q1400   cycloSPORINE  75 mg Oral BID   DULoxetine  60 mg Oral Daily   ferrous sulfate  325 mg Oral Q breakfast   finasteride  5 mg Oral Daily   fluticasone furoate-vilanterol  1 puff Inhalation Daily   folic acid  1 mg  Oral Daily   guaiFENesin  600 mg Oral BID   hydrALAZINE  75 mg Oral TID   insulin aspart  0-5 Units Subcutaneous QHS   insulin aspart  0-9 Units  Subcutaneous TID WC   ipratropium-albuterol  3 mL Nebulization BID   lacosamide  150 mg Oral BID   mycophenolate  180 mg Oral BID   polyethylene glycol  17 g Oral Daily   sodium bicarbonate  650 mg Oral TID   tamsulosin  0.8 mg Oral Daily   Continuous Infusions:  ampicillin-sulbactam (UNASYN) IV 3 g (09/05/21 0657)     LOS: 3 days       Sidney Ace, MD [facility]   If 7PM-7AM, please contact night-coverage  09/05/2021, 1:40 PM

## 2021-09-05 NOTE — Progress Notes (Signed)
[facility]  ROUNDING NOTE   Subjective:   Patient seen sitting up in bed Staff assisting with breakfast Continues to maintain good urine output, 1.1 L in preceding 24 hours Patient states he has outpatient appointment with [facility] nephrology on Friday and will like to be discharged to make that appointment.  Objective:  Vital signs in last 24 hours:  Temp:  [97.4 F (36.3 C)-98.4 F (36.9 C)] 98.4 F (36.9 C) (08/29 0316) Pulse Rate:  [59-67] 66 (08/29 0843) Resp:  [15-18] 18 (08/29 0316) BP: (102-147)/(43-74) 147/74 (08/29 0843) SpO2:  [99 %-100 %] 100 % (08/29 0316)  Weight change:  Filed Weights   09/01/21 1936  Weight: 67.1 kg    Intake/Output: I/O last 3 completed shifts: In: 1610 [P.O.:840; Blood:618; IV Piggyback:300] Out: 2100 [Urine:2100]   Intake/Output this shift:  Total I/O In: -  Out: 550 [Urine:550]  Physical Exam: General: NAD  Head: Normocephalic, atraumatic. Moist oral mucosal membranes  Eyes: Anicteric  Lungs:  Clear to auscultation, normal effort,   Heart: Regular rate and rhythm  Abdomen:  Soft, nontender  Extremities:  1+ peripheral edema.  Neurologic: Nonfocal, moving all four extremities  Skin: No lesions  Access: Lt AVF    Basic Metabolic Panel: Recent Labs  Lab 09/01/21 1952 09/02/21 2012 09/03/21 0635 09/04/21 1022 09/05/21 0645  NA 141  --  142 140 141  K 5.7* 4.7 4.8 4.8 4.9  CL 108  --  111 108 112*  CO2 23  --  25 21* 21*  GLUCOSE 154*  --  98 118* 95  BUN 60*  --  65* 66* 64*  CREATININE 4.31*  --  4.48* 4.28* 4.18*  CALCIUM 8.7*  --  8.4* 8.7* 8.8*  PHOS  --   --   --   --  4.5     Liver Function Tests: Recent Labs  Lab 09/01/21 1952 09/05/21 0645  AST 18  --   ALT 9  --   ALKPHOS 46  --   BILITOT 0.9  --   PROT 7.3  --   ALBUMIN 3.2* 3.1*    Recent Labs  Lab 09/01/21 1952  LIPASE 22    No results for input(s): "AMMONIA" in the last 168 hours.  CBC: Recent Labs  Lab 09/01/21 1952  09/03/21 0635 09/03/21 1958 09/04/21 1022 09/04/21 1352 09/05/21 0645  WBC 10.6* 5.9  --  4.8  --  6.0  NEUTROABS  --   --   --  4.0  --   --   HGB 7.1* 5.8* 6.2* 8.4* 8.0* 8.4*  HCT 23.9* 19.0* 20.3* 27.0* 25.7* 26.9*  MCV 77.9* 76.3*  --  76.5*  --  76.6*  PLT 169 160  --  236  --  209     Cardiac Enzymes: No results for input(s): "CKTOTAL", "CKMB", "CKMBINDEX", "TROPONINI" in the last 168 hours.  BNP: Invalid input(s): "POCBNP"  CBG: Recent Labs  Lab 09/04/21 1159 09/04/21 1825 09/04/21 2044 09/05/21 0807 09/05/21 1117  GLUCAP 166* 136* 132* 97 159*     Microbiology: Results for orders placed or performed during the hospital encounter of 09/02/21  SARS Coronavirus 2 by RT PCR (hospital order, performed in [facility] hospital lab) *cepheid single result test* Anterior Nasal Swab     Status: None   Collection Time: 09/02/21  3:04 AM   Specimen: Anterior Nasal Swab  Result Value Ref Range Status   SARS Coronavirus 2 by RT PCR NEGATIVE NEGATIVE Final  Comment: (NOTE) SARS-CoV-2 target nucleic acids are NOT DETECTED.  The SARS-CoV-2 RNA is generally detectable in upper and lower respiratory specimens during the acute phase of infection. The lowest concentration of SARS-CoV-2 viral copies this assay can detect is 250 copies / mL. A negative result does not preclude SARS-CoV-2 infection and should not be used as the sole basis for treatment or other patient management decisions.  A negative result may occur with improper specimen collection / handling, submission of specimen other than nasopharyngeal swab, presence of viral mutation(s) within the areas targeted by this assay, and inadequate number of viral copies (<250 copies / mL). A negative result must be combined with clinical observations, patient history, and epidemiological information.  Fact Sheet for Patients:   https://www.patel.info/  Fact Sheet for Healthcare  Providers: https://hall.com/  This test is not yet approved or  cleared by the [place] FDA and has been authorized for detection and/or diagnosis of SARS-CoV-2 by FDA under an Emergency Use Authorization (EUA).  This EUA will remain in effect (meaning this test can be used) for the duration of the COVID-19 declaration under Section 564(b)(1) of the Act, 21 U.S.C. section 360bbb-3(b)(1), unless the authorization is terminated or revoked sooner.  Performed at [facility], [address] [place], [place] [address]   Culture, blood (routine x 2)     Status: None (Preliminary result)   Collection Time: 09/02/21  4:58 AM   Specimen: BLOOD  Result Value Ref Range Status   Specimen Description BLOOD RIGHT [facility]  Final   Special Requests   Final    BOTTLES DRAWN AEROBIC AND ANAEROBIC Blood Culture results may not be optimal due to an excessive volume of blood received in culture bottles   Culture   Final    NO GROWTH 3 DAYS Performed at [facility], [address], [place], [place] [address]    Report Status PENDING  Incomplete  Culture, blood (routine x 2)     Status: None (Preliminary result)   Collection Time: 09/02/21  6:37 AM   Specimen: BLOOD RIGHT HAND  Result Value Ref Range Status   Specimen Description BLOOD RIGHT HAND  Final   Special Requests   Final    BOTTLES DRAWN AEROBIC AND ANAEROBIC Blood Culture adequate volume   Culture   Final    NO GROWTH 3 DAYS Performed at [facility], [address] [place], [place] [address]    Report Status PENDING  Incomplete  Urine Culture     Status: Abnormal   Collection Time: 09/02/21  5:30 PM   Specimen: Urine, Clean Catch  Result Value Ref Range Status   Specimen Description   Final    URINE, CLEAN CATCH Performed at [facility], [address], [place], [place] [address]    Special Requests   Final    NONE Performed at [facility], [address], [place], [place] [address]    Culture >=100,000 COLONIES/mL ENTEROCOCCUS FAECALIS (A)  Final   Report Status 09/05/2021 FINAL  Final   Organism ID, Bacteria ENTEROCOCCUS FAECALIS (A)  Final      Susceptibility   Enterococcus faecalis - MIC*    AMPICILLIN <=2 SENSITIVE Sensitive     NITROFURANTOIN <=16 SENSITIVE Sensitive     VANCOMYCIN 1 SENSITIVE Sensitive     * >=100,000 COLONIES/mL ENTEROCOCCUS FAECALIS    Coagulation Studies: No results for input(s): "LABPROT", "INR" in the last 72 hours.  Urinalysis: No results for input(s): "COLORURINE", "  LABSPEC", "PHURINE", "GLUCOSEU", "HGBUR", "BILIRUBINUR", "KETONESUR", "PROTEINUR", "UROBILINOGEN", "NITRITE", "LEUKOCYTESUR" in the last 72 hours.  Invalid input(s): "APPERANCEUR"     Imaging: US Venous Img Upper Uni Right(DVT)  Result Date: 09/04/2021 CLINICAL DATA:  Dialysis patient with right forearm swelling. EXAM: RIGHT UPPER EXTREMITY VENOUS DOPPLER ULTRASOUND TECHNIQUE: Gray-scale sonography with graded compression, as well as color Doppler and duplex ultrasound were performed to evaluate the upper extremity deep venous system from the level of the subclavian vein and including the jugular, axillary, basilic, radial, ulnar and upper cephalic vein. Spectral Doppler was utilized to evaluate flow at rest and with distal augmentation maneuvers. COMPARISON:  None Available. FINDINGS: Contralateral Subclavian Vein: Respiratory phasicity is normal and symmetric with the symptomatic side. No evidence of thrombus. Normal compressibility. Internal Jugular Vein: No evidence of thrombus. Normal compressibility, respiratory phasicity and response to augmentation. Subclavian Vein: No evidence of thrombus. Normal compressibility, respiratory phasicity and response to augmentation. Axillary Vein: No evidence of thrombus. Normal compressibility, respiratory phasicity and response to augmentation. Cephalic Vein: No evidence of thrombus. Normal  compressibility, respiratory phasicity and response to augmentation. Small in caliber and only well seen distally. Basilic Vein: No evidence of thrombus. Normal compressibility, respiratory phasicity and response to augmentation. Brachial Veins: No evidence of thrombus. Normal compressibility, respiratory phasicity and response to augmentation. Radial Veins: No evidence of thrombus. Normal compressibility, respiratory phasicity and response to augmentation. Ulnar Veins: No evidence of thrombus. Normal compressibility, respiratory phasicity and response to augmentation. Venous Reflux:  None visualized. Other Findings: Direct sonographic imaging over the area of right arm swelling revealed no underlying mass, fluid collection or focal sonographic abnormality. IMPRESSION: No evidence of DVT within the right upper extremity. Electronically Signed   By: Telford Nab M.D.   On: 09/04/2021 05:41     Medications:    ampicillin-sulbactam (UNASYN) IV 3 g (09/05/21 0657)    amLODipine  10 mg Oral Daily   apixaban  5 mg Oral BID   ascorbic acid  500 mg Oral Daily   atorvastatin  10 mg Oral Daily   carvedilol  25 mg Oral BID WC   Chlorhexidine Gluconate Cloth  6 each Topical Daily   cholecalciferol  2,000 Units Oral Q1400   cycloSPORINE  75 mg Oral BID   DULoxetine  60 mg Oral Daily   ferrous sulfate  325 mg Oral Q breakfast   finasteride  5 mg Oral Daily   fluticasone furoate-vilanterol  1 puff Inhalation Daily   folic acid  1 mg Oral Daily   guaiFENesin  600 mg Oral BID   hydrALAZINE  75 mg Oral TID   insulin aspart  0-5 Units Subcutaneous QHS   insulin aspart  0-9 Units Subcutaneous TID WC   ipratropium-albuterol  3 mL Nebulization BID   lacosamide  150 mg Oral BID   mycophenolate  180 mg Oral BID   polyethylene glycol  17 g Oral Daily   sodium bicarbonate  650 mg Oral TID   tamsulosin  0.8 mg Oral Daily   acetaminophen **OR** acetaminophen, albuterol, diphenhydrAMINE, hydrALAZINE, LORazepam,  magnesium hydroxide, ondansetron **OR** ondansetron (ZOFRAN) IV, traZODone  Assessment/ Plan:  Mr. Jared Gregory is a 66 y.o.  male with a past medical history of end-stage renal disease, s/p renal transplant, history of MGUS, hypertension, hyperlipidemia, diabetes mellitus type 2, secondary hyperparathyroidism, seizures, coronary artery disease who was brought to the ER with chief complaint of weakness.  Acute Kidney Injury with hyperkalemia on chronic kidney disease stage IV with baseline  creatinine 4.1 on 08/09/21.  Acute kidney injury secondary to hypovolemia and anemia, hemoglobin less than 6 Chronic kidney disease is secondary to diabetes and hypertension Renal ultrasound negative for obstruction.  Patient received cadaver renal transplant in April 2017. [facility] and has been told during recent appointments that his kidney function is low and he will need to reinitiate dialysis treatments in the near future.  Creatinine remains stable, GFR 15%.  Adequate urine output recorded.  Patient states he has follow-up appointment with [facility] nephrology on Friday.  Would like to be discharged in time to make this appointment.  Lab Results  Component Value Date   CREATININE 4.18 (H) 09/05/2021   CREATININE 4.28 (H) 09/04/2021   CREATININE 4.48 (H) 09/03/2021    Intake/Output Summary (Last 24 hours) at 09/05/2021 1403 Last data filed at 09/05/2021 1240 Gross per 24 hour  Intake 780 ml  Output 1650 ml  Net -870 ml    2. Anemia of chronic kidney disease Lab Results  Component Value Date   HGB 8.4 (L) 09/05/2021   Received blood transfusions during this admission. Hemoglobin continues to improve from blood transfusions on admission.  3. Secondary Hyperparathyroidism: with outpatient labs: PTH 100.9 on 06/22/21.   Lab Results  Component Value Date   CALCIUM 8.8 (L) 09/05/2021   PHOS 4.5 09/05/2021    We will continue to monitor bone minerals during this admission.  4. Diabetes mellitus  type II with chronic kidney disease/renal manifestations: noninsulin dependent.Most recent hemoglobin A1c is 5.2 on 01/11/21.   Primary team to manage sliding scale   LOS: 3   8/29/20232:03 PM

## 2021-09-05 NOTE — Plan of Care (Signed)

## 2021-09-06 DIAGNOSIS — I482 Chronic atrial fibrillation, unspecified: Secondary | ICD-10-CM | POA: Diagnosis not present

## 2021-09-06 DIAGNOSIS — N3001 Acute cystitis with hematuria: Secondary | ICD-10-CM

## 2021-09-06 DIAGNOSIS — D631 Anemia in chronic kidney disease: Secondary | ICD-10-CM

## 2021-09-06 DIAGNOSIS — N138 Other obstructive and reflux uropathy: Secondary | ICD-10-CM

## 2021-09-06 DIAGNOSIS — N184 Chronic kidney disease, stage 4 (severe): Secondary | ICD-10-CM | POA: Diagnosis not present

## 2021-09-06 DIAGNOSIS — J690 Pneumonitis due to inhalation of food and vomit: Secondary | ICD-10-CM | POA: Diagnosis not present

## 2021-09-06 DIAGNOSIS — N401 Enlarged prostate with lower urinary tract symptoms: Secondary | ICD-10-CM | POA: Diagnosis not present

## 2021-09-06 DIAGNOSIS — R112 Nausea with vomiting, unspecified: Secondary | ICD-10-CM

## 2021-09-06 LAB — BASIC METABOLIC PANEL
Anion gap: 8 (ref 5–15)
BUN: 63 mg/dL — ABNORMAL HIGH (ref 8–23)
CO2: 21 mmol/L — ABNORMAL LOW (ref 22–32)
Calcium: 8.5 mg/dL — ABNORMAL LOW (ref 8.9–10.3)
Chloride: 112 mmol/L — ABNORMAL HIGH (ref 98–111)
Creatinine, Ser: 4.17 mg/dL — ABNORMAL HIGH (ref 0.61–1.24)
GFR, Estimated: 15 mL/min — ABNORMAL LOW (ref >60)
Glucose, Bld: 87 mg/dL (ref 70–99)
Potassium: 4.7 mmol/L (ref 3.5–5.1)
Sodium: 141 mmol/L (ref 135–145)

## 2021-09-06 LAB — CBC WITH DIFFERENTIAL/PLATELET
Abs Immature Granulocytes: 0.04 10*3/uL (ref 0.00–0.07)
Basophils Absolute: 0 10*3/uL (ref 0.0–0.1)
Basophils Relative: 0 %
Eosinophils Absolute: 0.1 10*3/uL (ref 0.0–0.5)
Eosinophils Relative: 3 %
HCT: 25.9 % — ABNORMAL LOW (ref 39.0–52.0)
Hemoglobin: 8 g/dL — ABNORMAL LOW (ref 13.0–17.0)
Immature Granulocytes: 1 %
Lymphocytes Relative: 7 %
Lymphs Abs: 0.4 10*3/uL — ABNORMAL LOW (ref 0.7–4.0)
MCH: 23.5 pg — ABNORMAL LOW (ref 26.0–34.0)
MCHC: 30.9 g/dL (ref 30.0–36.0)
MCV: 76 fL — ABNORMAL LOW (ref 80.0–100.0)
Monocytes Absolute: 0.4 10*3/uL (ref 0.1–1.0)
Monocytes Relative: 9 %
Neutro Abs: 4 10*3/uL (ref 1.7–7.7)
Neutrophils Relative %: 80 %
Platelets: 211 10*3/uL (ref 150–400)
RBC: 3.41 MIL/uL — ABNORMAL LOW (ref 4.22–5.81)
RDW: 20.5 % — ABNORMAL HIGH (ref 11.5–15.5)
WBC: 5 10*3/uL (ref 4.0–10.5)
nRBC: 0 % (ref 0.0–0.2)

## 2021-09-06 LAB — HEPATITIS B E ANTIBODY: Hep B E Ab: NEGATIVE

## 2021-09-06 LAB — HEPATITIS B SURFACE ANTIBODY, QUANTITATIVE: Hep B S AB Quant (Post): 149 m[IU]/mL (ref >9.9)

## 2021-09-06 LAB — GLUCOSE, CAPILLARY
Glucose-Capillary: 94 mg/dL (ref 70–99)
Glucose-Capillary: 128 mg/dL — ABNORMAL HIGH (ref 70–99)

## 2021-09-06 MED ORDER — AMOXICILLIN-POT CLAVULANATE 250-125 MG PO TABS: 1.0000 | ORAL_TABLET | Freq: Two times a day (BID) | ORAL | 0 refills | Status: DC

## 2021-09-06 MED ORDER — AMOXICILLIN-POT CLAVULANATE 500-125 MG PO TABS: 1.0000 | ORAL_TABLET | Freq: Every evening | ORAL | Status: DC

## 2021-09-06 MED ORDER — AMOXICILLIN-POT CLAVULANATE 250-125 MG PO TABS
1.0000 | ORAL_TABLET | Freq: Two times a day (BID) | ORAL | Status: DC
  Filled 2021-09-06: qty 1

## 2021-09-06 MED ORDER — SODIUM CHLORIDE 0.9 % IV SOLN: INTRAVENOUS | Status: DC | PRN

## 2021-09-06 NOTE — Plan of Care (Signed)
  Problem: Clinical Measurements: Goal: Ability to maintain clinical measurements within normal limits will improve Outcome: Progressing   Problem: Nutrition: Goal: Adequate nutrition will be maintained Outcome: Progressing   

## 2021-09-06 NOTE — Evaluation (Addendum)
Occupational Therapy Evaluation Patient Details Name: Jared Gregory MRN: [id number] DOB: 1955-02-07 Today's Date: 09/06/2021   History of Present Illness Pt is a 66 year old male admitted with aspiration pneumonia, UTI after presenting to ED with nausea vomiting and cough. PMH significant for kidney transplantation on immunosuppressants, HTN, HLD, DM, depression with anxiety, atrial fibrillation on Eliquis, BPH, anemia, CKD-4,   Clinical Impression   Chart reviewed, RN Cleared pt for participation in OT evaluation. Pt is alert, oriented to self, date, situation, grossly oriented to place (required vcs). Pt endorses he lives at [facility] for [place], was working with PT/OT recently. Pt reports he requires assist for all ADL, can feed himself with AE however it is "messy". MOD-MAX A required for supine>sit, STS with TOTAL A with pt unable to bring himself full upright standing. MOD A required for sit>supine.MAX A required for toileting, feeding, grooming tasks. Pt presents with deficits in strength, endurance, activity tolerance affecting optimal ADL completion. OT will continue to follow acutely.      Recommendations for follow up therapy are one component of a multi-disciplinary discharge planning process, led by the attending physician.  Recommendations may be updated based on patient status, additional functional criteria and insurance authorization.   Follow Up Recommendations  Follow physician's recommendations for discharge plan and follow up therapies (OT at LTC facility)    Assistance Recommended at Discharge Frequent or constant Supervision/Assistance  Patient can return home with the following A lot of help with walking and/or transfers;A lot of help with bathing/dressing/bathroom;Assistance with feeding    Functional Status Assessment  Patient has had a recent decline in their functional status and demonstrates the ability to make significant improvements in function in a reasonable and  predictable amount of time.  Equipment Recommendations  Other (comment) (per LTC)    Recommendations for Other Services       Precautions / Restrictions Precautions Precautions: Fall Restrictions Weight Bearing Restrictions: No      Mobility Bed Mobility Overal bed mobility: Needs Assistance Bed Mobility: Supine to Sit, Sit to Supine     Supine to sit: Mod assist, Max assist, HOB elevated Sit to supine: Mod assist, HOB elevated   General bed mobility comments: intermittent vcs for assist    Transfers Overall transfer level: Needs assistance   Transfers: Sit to/from Stand Sit to Stand: Total assist           General transfer comment: unable to acheive upright standing      Balance Overall balance assessment: Needs assistance Sitting-balance support: Feet supported Sitting balance-Leahy Scale: Fair       Standing balance-Leahy Scale: Zero                             ADL either performed or assessed with clinical judgement   ADL Overall ADL's : Needs assistance/impaired Eating/Feeding: Maximal assistance;Sitting;Bed level   Grooming: Wash/dry face;Sitting;Moderate assistance           Upper Body Dressing : Maximal assistance;Bed level   Lower Body Dressing: Maximal assistance       Toileting- Clothing Manipulation and Hygiene: Maximal assistance;Bed level Toileting - Clothing Manipulation Details (indicate cue type and reason): following BM             Vision Patient Visual Report: No change from baseline       Perception     Praxis      Pertinent Vitals/Pain Pain Assessment Pain Assessment: No/denies pain  Hand Dominance Right   Extremity/Trunk Assessment Upper Extremity Assessment Upper Extremity Assessment: Generalized weakness;LUE deficits/detail;RUE deficits/detail (BUE AROM WFL; 3+/5 throughout) RUE Deficits / Details: PIP resting ifn lexing D1, D3-4, able to extend to neutral RUE Coordination: decreased  fine motor;decreased gross motor LUE Deficits / Details: PIP resting in flexion D1-4, able to extend to neutral LUE Coordination: decreased fine motor;decreased gross motor   Lower Extremity Assessment Lower Extremity Assessment: Generalized weakness       Communication     Cognition Arousal/Alertness: Awake/alert Behavior During Therapy: WFL for tasks assessed/performed, Flat affect Overall Cognitive Status: No family/caregiver present to determine baseline cognitive functioning Area of Impairment: Orientation, Memory, Following commands, Safety/judgement, Awareness, Problem solving                 Orientation Level: Disoriented to, Place (oriented with additonal cueing)   Memory: Decreased short-term memory Following Commands: Follows one step commands with increased time   Awareness: Emergent Problem Solving: Slow processing, Difficulty sequencing, Requires verbal cues, Requires tactile cues       General Comments       Exercises     Shoulder Instructions      Home Living Family/patient expects to be discharged to:: Skilled nursing facility                                 Additional Comments: LTC at [facility]      Prior Functioning/Environment Prior Level of Function : Needs assist       Physical Assist : Mobility (physical);ADLs (physical)     Mobility Comments: pt reports pt care lift to mwc, pushes with legs for MRADLs, recently started standing with PT at facility ADLs Comments: Pt reports assist for bathing, dressing, toileting, grooming; Pt reports he can self feed with AE however is becoming increasingly more difficult        OT Problem List: Decreased strength;Impaired balance (sitting and/or standing);Decreased safety awareness;Decreased knowledge of use of DME or AE;Impaired UE functional use      OT Treatment/Interventions: Self-care/ADL training;Patient/family education;Therapeutic exercise;Balance training;DME and/or AE  instruction;Therapeutic activities    OT Goals(Current goals can be found in the care plan section) Acute Rehab OT Goals Patient Stated Goal: go back to LTC OT Goal Formulation: With patient Time For Goal Achievement: 09/20/21 Potential to Achieve Goals: Good ADL Goals Pt Will Perform Grooming: with mod assist Pt Will Transfer to Toilet: with max assist Pt Will Perform Toileting - Clothing Manipulation and hygiene: with max assist  OT Frequency: Min 2X/week    Co-evaluation              AM-PAC OT "6 Clicks" Daily Activity     Outcome Measure Help from another person eating meals?: A Lot Help from another person taking care of personal grooming?: A Lot Help from another person toileting, which includes using toliet, bedpan, or urinal?: A Lot Help from another person bathing (including washing, rinsing, drying)?: A Lot Help from another person to put on and taking off regular upper body clothing?: A Lot Help from another person to put on and taking off regular lower body clothing?: A Lot 6 Click Score: 12   End of Session Equipment Utilized During Treatment: Gait belt Nurse Communication: Mobility status  Activity Tolerance: Patient tolerated treatment well Patient left: in bed;with call bell/phone within reach;with bed alarm set;with nursing/sitter in room  OT Visit Diagnosis: Unsteadiness on feet (R26.81);Muscle weakness (  generalized) (M62.81)                Time: 1771-1657 OT Time Calculation (min): 31 min Charges:  OT General Charges $OT Visit: 1 Visit OT Evaluation $OT Eval Low Complexity: 1 Low OT Treatments $Self Care/Home Management : 8-22 mins  Shanon Payor, OTD OTR/L  09/06/21, 10:51 AM

## 2021-09-06 NOTE — Progress Notes (Signed)
[facility]  ROUNDING NOTE   Subjective:   Patient seen sitting up in bed, currently working with PT/OT. Denies pain or discomfort Continues to make adequate urine, 1.6 L Renal function remains stable  Objective:  Vital signs in last 24 hours:  Temp:  [97.9 F (36.6 C)-98.6 F (37 C)] 98.6 F (37 C) (08/30 0823) Pulse Rate:  [58-72] 72 (08/30 1034) Resp:  [16-18] 16 (08/30 0555) BP: (128-153)/(41-72) 137/72 (08/30 0823) SpO2:  [96 %-100 %] 96 % (08/30 1034)  Weight change:  Filed Weights   09/01/21 1936  Weight: 67.1 kg    Intake/Output: I/O last 3 completed shifts: In: 20 [P.O.:480; IV Piggyback:300] Out: 2720 [Urine:2720]   Intake/Output this shift:  Total I/O In: 331.9 [I.V.:31.9; IV Piggyback:300] Out: -   Physical Exam: General: NAD  Head: Normocephalic, atraumatic. Moist oral mucosal membranes  Eyes: Anicteric  Lungs:  Clear to auscultation, normal effort,   Heart: Regular rate and rhythm  Abdomen:  Soft, nontender  Extremities:  1+ peripheral edema.  Neurologic: Nonfocal, moving all four extremities  Skin: No lesions  Access: Lt AVF    Basic Metabolic Panel: Recent Labs  Lab 09/01/21 1952 09/02/21 2012 09/03/21 0635 09/04/21 1022 09/05/21 0645 09/06/21 0401  NA 141  --  142 140 141 141  K 5.7* 4.7 4.8 4.8 4.9 4.7  CL 108  --  111 108 112* 112*  CO2 23  --  25 21* 21* 21*  GLUCOSE 154*  --  98 118* 95 87  BUN 60*  --  65* 66* 64* 63*  CREATININE 4.31*  --  4.48* 4.28* 4.18* 4.17*  CALCIUM 8.7*  --  8.4* 8.7* 8.8* 8.5*  PHOS  --   --   --   --  4.5  --      Liver Function Tests: Recent Labs  Lab 09/01/21 1952 09/05/21 0645  AST 18  --   ALT 9  --   ALKPHOS 46  --   BILITOT 0.9  --   PROT 7.3  --   ALBUMIN 3.2* 3.1*    Recent Labs  Lab 09/01/21 1952  LIPASE 22    No results for input(s): "AMMONIA" in the last 168 hours.  CBC: Recent Labs  Lab 09/01/21 1952 09/03/21 0635 09/03/21 1958 09/04/21 1022  09/04/21 1352 09/05/21 0645 09/06/21 0401  WBC 10.6* 5.9  --  4.8  --  6.0 5.0  NEUTROABS  --   --   --  4.0  --   --  4.0  HGB 7.1* 5.8* 6.2* 8.4* 8.0* 8.4* 8.0*  HCT 23.9* 19.0* 20.3* 27.0* 25.7* 26.9* 25.9*  MCV 77.9* 76.3*  --  76.5*  --  76.6* 76.0*  PLT 169 160  --  236  --  209 211     Cardiac Enzymes: No results for input(s): "CKTOTAL", "CKMB", "CKMBINDEX", "TROPONINI" in the last 168 hours.  BNP: Invalid input(s): "POCBNP"  CBG: Recent Labs  Lab 09/05/21 1117 09/05/21 1706 09/05/21 2141 09/06/21 0809 09/06/21 1205  GLUCAP 159* 111* 128* 94 128*     Microbiology: Results for orders placed or performed during the hospital encounter of 09/02/21  SARS Coronavirus 2 by RT PCR (hospital order, performed in [facility] hospital lab) *cepheid single result test* Anterior Nasal Swab     Status: None   Collection Time: 09/02/21  3:04 AM   Specimen: Anterior Nasal Swab  Result Value Ref Range Status   SARS Coronavirus 2 by RT  PCR NEGATIVE NEGATIVE Final    Comment: (NOTE) SARS-CoV-2 target nucleic acids are NOT DETECTED.  The SARS-CoV-2 RNA is generally detectable in upper and lower respiratory specimens during the acute phase of infection. The lowest concentration of SARS-CoV-2 viral copies this assay can detect is 250 copies / mL. A negative result does not preclude SARS-CoV-2 infection and should not be used as the sole basis for treatment or other patient management decisions.  A negative result may occur with improper specimen collection / handling, submission of specimen other than nasopharyngeal swab, presence of viral mutation(s) within the areas targeted by this assay, and inadequate number of viral copies (<250 copies / mL). A negative result must be combined with clinical observations, patient history, and epidemiological information.  Fact Sheet for Patients:   https://www.patel.info/  Fact Sheet for Healthcare  Providers: https://hall.com/  This test is not yet approved or  cleared by the [place] FDA and has been authorized for detection and/or diagnosis of SARS-CoV-2 by FDA under an Emergency Use Authorization (EUA).  This EUA will remain in effect (meaning this test can be used) for the duration of the COVID-19 declaration under Section 564(b)(1) of the Act, 21 U.S.C. section 360bbb-3(b)(1), unless the authorization is terminated or revoked sooner.  Performed at [facility], [address] [place], [place] [address]   Culture, blood (routine x 2)     Status: None (Preliminary result)   Collection Time: 09/02/21  4:58 AM   Specimen: BLOOD  Result Value Ref Range Status   Specimen Description BLOOD RIGHT [facility]  Final   Special Requests   Final    BOTTLES DRAWN AEROBIC AND ANAEROBIC Blood Culture results may not be optimal due to an excessive volume of blood received in culture bottles   Culture   Final    NO GROWTH 4 DAYS Performed at [facility], [address], [place], [place] [address]    Report Status PENDING  Incomplete  Culture, blood (routine x 2)     Status: None (Preliminary result)   Collection Time: 09/02/21  6:37 AM   Specimen: BLOOD RIGHT HAND  Result Value Ref Range Status   Specimen Description BLOOD RIGHT HAND  Final   Special Requests   Final    BOTTLES DRAWN AEROBIC AND ANAEROBIC Blood Culture adequate volume   Culture   Final    NO GROWTH 4 DAYS Performed at [facility], [address] [place], [place] [address]    Report Status PENDING  Incomplete  Urine Culture     Status: Abnormal   Collection Time: 09/02/21  5:30 PM   Specimen: Urine, Clean Catch  Result Value Ref Range Status   Specimen Description   Final    URINE, CLEAN CATCH Performed at [facility], [address] [place], [place] [address]    Special Requests   Final    NONE Performed at [facility], [address] [place], [place] [address]    Culture >=100,000 COLONIES/mL ENTEROCOCCUS FAECALIS (A)  Final   Report Status 09/05/2021 FINAL  Final   Organism ID, Bacteria ENTEROCOCCUS FAECALIS (A)  Final      Susceptibility   Enterococcus faecalis - MIC*    AMPICILLIN <=2 SENSITIVE Sensitive     NITROFURANTOIN <=16 SENSITIVE Sensitive     VANCOMYCIN 1 SENSITIVE Sensitive     * >=100,000 COLONIES/mL ENTEROCOCCUS FAECALIS    Coagulation Studies: No results for input(s): "LABPROT", "INR" in the last 72 hours.  Urinalysis: No results for input(s): "COLORURINE", "LABSPEC", "PHURINE", "GLUCOSEU", "HGBUR", "BILIRUBINUR", "KETONESUR", "PROTEINUR", "UROBILINOGEN", "NITRITE", "LEUKOCYTESUR" in the last 72 hours.  Invalid input(s): "APPERANCEUR"     Imaging: No results found.   Medications:    sodium chloride Stopped (09/06/21 1051)    amLODipine  10 mg Oral Daily   amoxicillin-clavulanate  1 tablet Oral Q12H   apixaban  5 mg Oral BID   ascorbic acid  500 mg Oral Daily   atorvastatin  10 mg Oral Daily   carvedilol  25 mg Oral BID WC   cholecalciferol  2,000 Units Oral Q1400   cycloSPORINE  75 mg Oral BID   DULoxetine  60 mg Oral Daily   ferrous sulfate  325 mg Oral Q breakfast   finasteride  5 mg Oral Daily   fluticasone furoate-vilanterol  1 puff Inhalation Daily   folic acid  1 mg Oral Daily   guaiFENesin  600 mg Oral BID   hydrALAZINE  75 mg Oral TID   insulin aspart  0-5 Units Subcutaneous QHS   insulin aspart  0-9 Units Subcutaneous TID WC   lacosamide  150 mg Oral BID   mycophenolate  180 mg Oral BID   polyethylene glycol  17 g Oral Daily   sodium bicarbonate  650 mg Oral TID   tamsulosin  0.8 mg Oral Daily   sodium chloride, acetaminophen **OR** acetaminophen, albuterol, diphenhydrAMINE, hydrALAZINE, LORazepam, magnesium hydroxide, ondansetron **OR** ondansetron (ZOFRAN) IV, traZODone  Assessment/ Plan:  Mr. Jared Gregory is a 66 y.o.  male with a past medical history of  end-stage renal disease, s/p renal transplant, history of MGUS, hypertension, hyperlipidemia, diabetes mellitus type 2, secondary hyperparathyroidism, seizures, coronary artery disease who was brought to the ER with chief complaint of weakness.  Acute Kidney Injury with hyperkalemia on chronic kidney disease stage IV with baseline creatinine 4.1 on 08/09/21.  Acute kidney injury secondary to hypovolemia and anemia, hemoglobin less than 6 Chronic kidney disease is secondary to diabetes and hypertension Renal ultrasound negative for obstruction.  Patient received cadaver renal transplant in April 2017. [facility] been told during recent appointments that his kidney function is low and he will need to reinitiate dialysis treatments in the near future.  Renal function remains stable at 15%.  Patient continues to have adequate urine output.  Patient cleared to discharge from renal stance and follow-up with outpatient nephrology at tomorrow scheduled appointment.  Lab Results  Component Value Date   CREATININE 4.17 (H) 09/06/2021   CREATININE 4.18 (H) 09/05/2021   CREATININE 4.28 (H) 09/04/2021    Intake/Output Summary (Last 24 hours) at 09/06/2021 1415 Last data filed at 09/06/2021 1112 Gross per 24 hour  Intake 331.93 ml  Output 1070 ml  Net -738.07 ml    2. Anemia of chronic kidney disease Lab Results  Component Value Date   HGB 8.0 (L) 09/06/2021   Received blood transfusions during this admission. Hemoglobin below target however improved.  We will continue to monitor.  3. Secondary Hyperparathyroidism: with outpatient labs: PTH 100.9 on 06/22/21.   Lab Results  Component Value Date   CALCIUM 8.5 (L) 09/06/2021   PHOS 4.5 09/05/2021    Calcium and phosphorus within acceptable range.  4. Diabetes mellitus type II with chronic kidney disease/renal manifestations: noninsulin dependent.Most recent hemoglobin A1c is 5.2 on 01/11/21.   Primary team to manage sliding scale    LOS: 4   8/30/20232:15 PM

## 2021-09-06 NOTE — TOC Progression Note (Addendum)
Transition of Care [facility]) - Progression Note    Patient Details  Name: Tavone Caesar MRN: [id number] Date of Birth: November 08, 1955  Transition of [facility]) CM/SW [place], RN Phone Number: 09/06/2021, 10:38 AM  Clinical Narrative:     The patient resides LTC at [facility] confirmed with Hilda Blades at [facility] that they can accept the patient back today He will go to room 219B Patient is aware and will notify his family And friends Nurse to call report to [facility]  I called EMS to transport and the nurse is aware   Expected Discharge Plan: Skilled Nursing Facility Barriers to Discharge: Barriers Resolved  Expected Discharge Plan and Services Expected Discharge Plan: [place]         Expected Discharge Date: 09/06/21                                     Social Determinants of Health (SDOH) Interventions    Readmission Risk Interventions    01/18/2021    1:48 PM  Readmission Risk Prevention Plan  Transportation Screening Complete  PCP or Specialist Appt within 3-5 Days Complete  HRI or Home Care Consult Complete  Social Work Consult for [place] Planning/Counseling Not Complete  SW consult not completed comments RNCM assigned  Palliative Care Screening Complete  Medication Review Press photographer) Complete

## 2021-09-06 NOTE — Discharge Summary (Signed)
Physician Discharge Summary   Patient: Jared Gregory MRN: [id number] DOB: 10/09/1955  Admit date:     09/02/2021  Discharge date: 09/06/21  Discharge Physician: Loletha Grayer   PCP: [place]   Recommendations at discharge:   Follow-up at long-term care [facility] doctor in 1 day Keep appointment with your nephrologist on September 6.  Discharge Diagnoses: Principal Problem:   Aspiration pneumonia ([place]) Active Problems:   Hyperkalemia   CKD (chronic kidney disease), stage IV (HCC)   Renal transplant recipient   Essential hypertension   Seizure-like activity (HCC)   HLD (hyperlipidemia)   Type II diabetes mellitus with renal manifestations (HCC)   BPH (benign prostatic hyperplasia)   Nausea & vomiting   Anemia in chronic kidney disease (CKD)   Depression   Chronic a-fib (HCC)   UTI (urinary tract infection)    Hospital Course: 66 y.o. male with medical history significant of s/p of kidney transplantation on immunosuppressants, HTN, HLD, DM, depression with anxiety, atrial fibrillation on Eliquis, BPH, anemia, CKD-4,  MGUS, who presents with nausea vomiting and cough.   Patient states that he has nausea vomiting for 2 days.  He has several episodes of nonbilious nonbloody vomiting each day.  He developed dry cough and mild shortness breath today.  No chest pain.  No fever or chills.  Patient is now late using oxygen only at night as needed.  He was found to have oxygen desaturation to 91% on room air, which improved to 95% on 2 L oxygen.  Patient does not have symptoms of UTI.  No unilateral numbness or tinglings extremities.   8/27: Hemoglobin worsened to 5.8.  Receiving 2 units PRBC.  Cyclosporine level pending.  Case discussed with nephrology.  Patient will likely need renal replacement therapy soon.  Creatinine worsening over interval.   Last hemoglobin 8.0.  Assessment and Plan: * Aspiration pneumonia ([place]) The patient was on Unasyn and will  switch over to Augmentin 250 mg twice a day for 5 more doses upon discharge.  This will also cover the Enterococcus faecalis in the urine culture.   Hyperkalemia On presentation.  This was treated.  Last potassium 4.7.  CKD (chronic kidney disease), stage IV (HCC) Last creatinine 4.17.  Seen by nephrology and recommended outpatient follow-up.  May end up needing dialysis in the future but as per nephrology not right now.  Renal transplant recipient Patient is taking mycophenolate and cyclosporine.  US-renal showed transplanted kidney with moderate pelvicaliectasis/hydronephrosis, increased since March 14, 2021 ultrasound imaging. Bladder scan showed 200 retention. No obstructive stone. UA is positive for UTI  -Continue home mycophenolate and cyclosporine     Essential hypertension - IV hydralazine as needed -Amlodipine, Coreg, hydralazine -Hold torsemide due to worsening renal function  Seizure-like activity (HCC) - Seizure precaution -As needed Ativan for seizure -Continue home lacosamide  HLD (hyperlipidemia) - Lipitor  Type II diabetes mellitus with renal manifestations (HCC) Recent A1c 5.2, well controlled.  Will hold on Tradjenta with hemoglobin A1c being low.  BPH (benign prostatic hyperplasia) - Flomax and Proscar  Nausea & vomiting Possibly due for UTI and/or pneumonia.  This has resolved and eating better.  Anemia in chronic kidney disease (CKD) Patient received 2 units of packed red blood cells during the hospital course with hemoglobin dropping down to 5.8 with IV fluids.  Most recent hemoglobin 8.0.  Depression - Continue home medications  UTI (urinary tract infection) Enterococcus faecalis growing out of urine culture.  On Unasyn here  and will switch over to Augmentin for 5 more doses upon discharge.  Chronic a-fib (HCC) Heart rate 89 -Continue Eliquis, Coreg         Consultants: Nephrology Procedures performed: None Disposition: Long term care  facility Diet recommendation:  Renal diet DISCHARGE MEDICATION: Allergies as of 09/06/2021       Reactions   Baclofen    Lisinopril    Remeron [mirtazapine]    Vancomycin    Zofran [ondansetron]         Medication List     STOP taking these medications    cloNIDine 0.1 MG tablet Commonly known as: CATAPRES   labetalol 200 MG tablet Commonly known as: NORMODYNE   linagliptin 5 MG Tabs tablet Commonly known as: TRADJENTA   LORazepam 1 MG tablet Commonly known as: ATIVAN   torsemide 20 MG tablet Commonly known as: DEMADEX       TAKE these medications    acetaminophen 325 MG tablet Commonly known as: TYLENOL Take 2 tablets by mouth every 6 (six) hours as needed.   amLODipine 10 MG tablet Commonly known as: NORVASC Take 10 mg by mouth daily.   amoxicillin-clavulanate 250-125 MG tablet Commonly known as: AUGMENTIN Take 1 tablet by mouth every 12 (twelve) hours.   ascorbic acid 500 MG tablet Commonly known as: VITAMIN C Take 500 mg by mouth daily.   atorvastatin 10 MG tablet Commonly known as: LIPITOR Take 10 mg by mouth daily.   carvedilol 25 MG tablet Commonly known as: COREG Take 25 mg by mouth 2 (two) times daily with a meal.   cycloSPORINE 25 MG capsule Commonly known as: SANDIMMUNE Take 3 capsules by mouth 2 (two) times daily.   diclofenac Sodium 1 % Gel Commonly known as: VOLTAREN Apply 2 g topically 4 (four) times daily.   diphenhydrAMINE 25 MG tablet Commonly known as: BENADRYL Take 25 mg by mouth every 6 (six) hours as needed.   DULoxetine 60 MG capsule Commonly known as: CYMBALTA Take 60 mg by mouth daily.   Eliquis 5 MG Tabs tablet Generic drug: apixaban Take 5 mg by mouth 2 (two) times daily.   ferrous sulfate 325 (65 FE) MG tablet Take 325 mg by mouth daily with breakfast.   finasteride 5 MG tablet Commonly known as: PROSCAR Take 5 mg by mouth daily.   fluticasone furoate-vilanterol 200-25 MCG/ACT Aepb Commonly known  as: BREO ELLIPTA Inhale 1 puff into the lungs daily.   folic acid 1 MG tablet Commonly known as: FOLVITE Take 1 tablet (1 mg total) by mouth daily.   hydrALAZINE 50 MG tablet Commonly known as: APRESOLINE Take 75 mg by mouth 3 (three) times daily.   Lacosamide 150 MG Tabs Take 1 tablet (150 mg total) by mouth 2 (two) times daily.   mycophenolate 180 MG EC tablet Commonly known as: MYFORTIC Take 180 mg by mouth 2 (two) times daily.   polyethylene glycol 17 g packet Commonly known as: MIRALAX / GLYCOLAX Take 17 g by mouth daily.   sodium bicarbonate 650 MG tablet Take 1 tablet by mouth 3 (three) times daily.   tamsulosin 0.4 MG Caps capsule Commonly known as: FLOMAX Take 0.8 mg by mouth daily.   Vitamin D3 50 MCG (2000 UT) Tabs Take 1 tablet by mouth in the morning and at bedtime.        Discharge Exam: Filed Weights   09/01/21 1936  Weight: 67.1 kg   Physical Exam HENT:     Head: Normocephalic.  Mouth/Throat:     Pharynx: No oropharyngeal exudate.  Eyes:     General: Lids are normal.     Conjunctiva/sclera: Conjunctivae normal.  Cardiovascular:     Rate and Rhythm: Normal rate and regular rhythm.     Heart sounds: Normal heart sounds, S1 normal and S2 normal.  Pulmonary:     Breath sounds: No decreased breath sounds, wheezing, rhonchi or rales.  Abdominal:     Palpations: Abdomen is soft.     Tenderness: There is no abdominal tenderness.  Musculoskeletal:     Right lower leg: No swelling.     Left lower leg: No swelling.  Skin:    General: Skin is warm.     Findings: No rash.  Neurological:     Mental Status: He is alert and oriented to person, place, and time.      Condition at discharge: stable  The results of significant diagnostics from this hospitalization (including imaging, microbiology, ancillary and laboratory) are listed below for reference.   Imaging Studies: US Venous Img Upper Uni Right(DVT)  Result Date: 09/04/2021 CLINICAL  DATA:  Dialysis patient with right forearm swelling. EXAM: RIGHT UPPER EXTREMITY VENOUS DOPPLER ULTRASOUND TECHNIQUE: Gray-scale sonography with graded compression, as well as color Doppler and duplex ultrasound were performed to evaluate the upper extremity deep venous system from the level of the subclavian vein and including the jugular, axillary, basilic, radial, ulnar and upper cephalic vein. Spectral Doppler was utilized to evaluate flow at rest and with distal augmentation maneuvers. COMPARISON:  None Available. FINDINGS: Contralateral Subclavian Vein: Respiratory phasicity is normal and symmetric with the symptomatic side. No evidence of thrombus. Normal compressibility. Internal Jugular Vein: No evidence of thrombus. Normal compressibility, respiratory phasicity and response to augmentation. Subclavian Vein: No evidence of thrombus. Normal compressibility, respiratory phasicity and response to augmentation. Axillary Vein: No evidence of thrombus. Normal compressibility, respiratory phasicity and response to augmentation. Cephalic Vein: No evidence of thrombus. Normal compressibility, respiratory phasicity and response to augmentation. Small in caliber and only well seen distally. Basilic Vein: No evidence of thrombus. Normal compressibility, respiratory phasicity and response to augmentation. Brachial Veins: No evidence of thrombus. Normal compressibility, respiratory phasicity and response to augmentation. Radial Veins: No evidence of thrombus. Normal compressibility, respiratory phasicity and response to augmentation. Ulnar Veins: No evidence of thrombus. Normal compressibility, respiratory phasicity and response to augmentation. Venous Reflux:  None visualized. Other Findings: Direct sonographic imaging over the area of right arm swelling revealed no underlying mass, fluid collection or focal sonographic abnormality. IMPRESSION: No evidence of DVT within the right upper extremity. Electronically Signed    By: Telford Nab M.D.   On: 09/04/2021 05:41   US RENAL  Result Date: 09/02/2021 CLINICAL DATA:  Renal transplant.  ATN. EXAM: RENAL / URINARY TRACT ULTRASOUND COMPLETE COMPARISON:  CT scan of the abdomen and pelvis September 02, 2021. Renal ultrasound March 14, 2021. FINDINGS: Right Kidney: Renal measurements: 7.9 x 3.1 x 4.2 cm = volume: 53 mL. Marked increased cortical echogenicity. Left Kidney: Not visualized. Bladder: Debris in the bladder. Right Transplanted kidney: Renal measurements: 11.6 x 6.1 x 6.4 cm = volume: 230 a mL. Moderate pelvicaliectasis/hydronephrosis, increased since March 14, 2021. Other: None. IMPRESSION: 1. The transplanted kidney in the right lower quadrant/right pelvis demonstrates moderate pelvicaliectasis/hydronephrosis, increased since March 14, 2021 ultrasound imaging. 2. The right kidney is a trophic consistent with history. The left kidney could not be visualized. 3. Mild dependent debris in the bladder. Electronically Signed   By:  Dorise Bullion III M.D.   On: 09/02/2021 14:43   DG Chest Port 1 View  Result Date: 09/02/2021 CLINICAL DATA:  Vomiting and hypoxia. EXAM: PORTABLE CHEST 1 VIEW COMPARISON:  Chest CT 01/13/2021 cough portable chest 01/10/2022. FINDINGS: There is mild-to-moderate cardiomegaly. There is increased central vascular distension and flow cephalization and increased interstitial edema in the base of both lungs with small pleural effusions. There are patchy opacities in the left mid perihilar and lower lung fields, hazy interstitial consolidation in the right infrahilar area, findings most likely indicating superimposed multifocal pneumonia. Underlying edema component of airspace disease not excluded. The remaining lungs are generally clear. There is slight aortic tortuosity with stable mediastinum. No acute osseous abnormality. IMPRESSION: 1. Cardiomegaly, perihilar vascular distension and basilar interstitial edema. Findings consistent with CHF or fluid  overload. 2. Small pleural effusions, with opacities in the left mid and lower lung field and right infrahilar area which most likely reflect superimposed multifocal pneumonia, less likely alveolar edema. 3. Clinical correlation and radiographic follow-up recommended. Electronically Signed   By: Telford Nab M.D.   On: 09/02/2021 02:52   CT Abdomen Pelvis Wo Contrast  Result Date: 09/02/2021 CLINICAL DATA:  Nausea and vomiting for 2 days EXAM: CT ABDOMEN AND PELVIS WITHOUT CONTRAST TECHNIQUE: Multidetector CT imaging of the abdomen and pelvis was performed following the standard protocol without IV contrast. RADIATION DOSE REDUCTION: This exam was performed according to the departmental dose-optimization program which includes automated exposure control, adjustment of the mA and/or kV according to patient size and/or use of iterative reconstruction technique. COMPARISON:  None Available. FINDINGS: Lower chest: Lung bases demonstrate small bilateral pleural effusions right greater than left with associated atelectasis in the right base and patchy atelectatic changes in the left base. These changes suggest possible aspiration given some hyperdense material within the substance of the right lower lobe. Hepatobiliary: No focal liver abnormality is seen. No gallstones, gallbladder wall thickening, or biliary dilatation. Pancreas: Unremarkable. No pancreatic ductal dilatation or surrounding inflammatory changes. Spleen: Normal in size without focal abnormality. Adrenals/Urinary Tract: Adrenal glands are within normal limits. Heavy renal vascular calcifications are noted. No focal mass is seen. Small cysts are again identified in the kidneys bilaterally. No follow-up is recommended. No obstructive changes are seen. The bladder is well distended. A transplant kidney is noted in the right hemipelvis. Mild fullness of the transplant renal pelvis and ureter is noted although no obstructing lesion is seen. Stomach/Bowel:  Scattered fecal material is noted throughout the colon without obstructive change. The appendix is not discretely visualized although no inflammatory changes to suggest appendicitis are noted. Small bowel and stomach appear within normal limits. Vascular/Lymphatic: Aortic atherosclerosis. No enlarged abdominal or pelvic lymph nodes. Reproductive: Prostate is unremarkable. Other: Mild changes of anasarca are noted.  No ascites is seen. Musculoskeletal: No acute or significant osseous findings. IMPRESSION: Bilateral pleural effusions right greater than left with associated atelectatic changes and findings suspicious for aspiration. Scattered colonic fecal material without evidence of obstruction. Changes of anasarca. Right pelvic transplant kidney with fullness of the collecting system and ureter although no obstructing lesion is noted. Electronically Signed   By: Inez Catalina M.D.   On: 09/02/2021 02:47    Microbiology: Results for orders placed or performed during the hospital encounter of 09/02/21  SARS Coronavirus 2 by RT PCR (hospital order, performed in [facility] hospital lab) *cepheid single result test* Anterior Nasal Swab     Status: None   Collection Time: 09/02/21  3:04  AM   Specimen: Anterior Nasal Swab  Result Value Ref Range Status   SARS Coronavirus 2 by RT PCR NEGATIVE NEGATIVE Final    Comment: (NOTE) SARS-CoV-2 target nucleic acids are NOT DETECTED.  The SARS-CoV-2 RNA is generally detectable in upper and lower respiratory specimens during the acute phase of infection. The lowest concentration of SARS-CoV-2 viral copies this assay can detect is 250 copies / mL. A negative result does not preclude SARS-CoV-2 infection and should not be used as the sole basis for treatment or other patient management decisions.  A negative result may occur with improper specimen collection / handling, submission of specimen other than nasopharyngeal swab, presence of viral mutation(s) within  the areas targeted by this assay, and inadequate number of viral copies (<250 copies / mL). A negative result must be combined with clinical observations, patient history, and epidemiological information.  Fact Sheet for Patients:   https://www.patel.info/  Fact Sheet for Healthcare Providers: https://hall.com/  This test is not yet approved or  cleared by the [place] FDA and has been authorized for detection and/or diagnosis of SARS-CoV-2 by FDA under an Emergency Use Authorization (EUA).  This EUA will remain in effect (meaning this test can be used) for the duration of the COVID-19 declaration under Section 564(b)(1) of the Act, 21 U.S.C. section 360bbb-3(b)(1), unless the authorization is terminated or revoked sooner.  Performed at [facility] [address] [place],  [address]   Culture, blood (routine x 2)     Status: None (Preliminary result)   Collection Time: 09/02/21  4:58 AM   Specimen: BLOOD  Result Value Ref Range Status   Specimen Description BLOOD RIGHT [facility]  Final   Special Requests   Final    BOTTLES DRAWN AEROBIC AND ANAEROBIC Blood Culture results may not be optimal due to an excessive volume of blood received in culture bottles   Culture   Final    NO GROWTH 4 DAYS Performed at [facility], [address] [place], [place] [address]    Report Status PENDING  Incomplete  Culture, blood (routine x 2)     Status: None (Preliminary result)   Collection Time: 09/02/21  6:37 AM   Specimen: BLOOD RIGHT HAND  Result Value Ref Range Status   Specimen Description BLOOD RIGHT HAND  Final   Special Requests   Final    BOTTLES DRAWN AEROBIC AND ANAEROBIC Blood Culture adequate volume   Culture   Final    NO GROWTH 4 DAYS Performed at [facility], [address], [place], [place] [address]    Report Status PENDING  Incomplete  Urine Culture     Status: Abnormal   Collection  Time: 09/02/21  5:30 PM   Specimen: Urine, Clean Catch  Result Value Ref Range Status   Specimen Description   Final    URINE, CLEAN CATCH Performed at [facility], [address], [place], [place] [address]    Special Requests   Final    NONE Performed at [facility], [address], [place], [place] [address]    Culture >=100,000 COLONIES/mL ENTEROCOCCUS FAECALIS (A)  Final   Report Status 09/05/2021 FINAL  Final   Organism ID, Bacteria ENTEROCOCCUS FAECALIS (A)  Final      Susceptibility   Enterococcus faecalis - MIC*    AMPICILLIN <=2 SENSITIVE Sensitive     NITROFURANTOIN <=16 SENSITIVE Sensitive     VANCOMYCIN 1 SENSITIVE Sensitive     * >=  100,000 COLONIES/mL ENTEROCOCCUS FAECALIS    Labs: CBC: Recent Labs  Lab 09/01/21 1952 09/03/21 0635 09/03/21 1958 09/04/21 1022 09/04/21 1352 09/05/21 0645 09/06/21 0401  WBC 10.6* 5.9  --  4.8  --  6.0 5.0  NEUTROABS  --   --   --  4.0  --   --  4.0  HGB 7.1* 5.8* 6.2* 8.4* 8.0* 8.4* 8.0*  HCT 23.9* 19.0* 20.3* 27.0* 25.7* 26.9* 25.9*  MCV 77.9* 76.3*  --  76.5*  --  76.6* 76.0*  PLT 169 160  --  236  --  209 491   Basic Metabolic Panel: Recent Labs  Lab 09/01/21 1952 09/02/21 2012 09/03/21 0635 09/04/21 1022 09/05/21 0645 09/06/21 0401  NA 141  --  142 140 141 141  K 5.7* 4.7 4.8 4.8 4.9 4.7  CL 108  --  111 108 112* 112*  CO2 23  --  25 21* 21* 21*  GLUCOSE 154*  --  98 118* 95 87  BUN 60*  --  65* 66* 64* 63*  CREATININE 4.31*  --  4.48* 4.28* 4.18* 4.17*  CALCIUM 8.7*  --  8.4* 8.7* 8.8* 8.5*  PHOS  --   --   --   --  4.5  --    Liver Function Tests: Recent Labs  Lab 09/01/21 1952 09/05/21 0645  AST 18  --   ALT 9  --   ALKPHOS 46  --   BILITOT 0.9  --   PROT 7.3  --   ALBUMIN 3.2* 3.1*   CBG: Recent Labs  Lab 09/05/21 0807 09/05/21 1117 09/05/21 1706 09/05/21 2141 09/06/21 0809  GLUCAP 90 159* 111* 128* 94    Discharge time spent: greater than 30  minutes.  Signed: Loletha Grayer, MD [facility] 09/06/2021

## 2021-09-06 NOTE — Plan of Care (Signed)

## 2021-09-06 NOTE — Progress Notes (Signed)
PT Cancellation Note  Patient Details Name: Arzell Mcgeehan MRN: [id number] DOB: 10-11-1955   Cancelled Treatment:    Reason Eval/Treat Not Completed: Other (comment)Patient currently with OT on arrival. Will return in a little while to assess. Patient now has discharge order. Per OT he is hoyer lifted at SNF where he resides.    Ezechiel Stooksbury 09/06/2021, 10:24 AM

## 2021-09-06 NOTE — Progress Notes (Signed)
Report called to Gabriel Cirri, Therapist, sports, at [facility].  IV removed without complication; patient tolerated well.  Awaiting EMS transport.

## 2021-09-07 LAB — CULTURE, BLOOD (ROUTINE X 2)
Culture: NO GROWTH
Culture: NO GROWTH
Special Requests: ADEQUATE

## 2021-09-09 ENCOUNTER — Emergency Department
Admission: EM | Admit: 2021-09-09 | Discharge: 2021-09-09 | Disposition: A | Discharge status: Other Acute Inpatient Hospital | Payer: No Typology Code available for payment source | Attending: Emergency Medicine | Admitting: Emergency Medicine

## 2021-09-09 ENCOUNTER — Emergency Department: Payer: No Typology Code available for payment source

## 2021-09-09 ENCOUNTER — Other Ambulatory Visit: Payer: Self-pay

## 2021-09-09 DIAGNOSIS — N189 Chronic kidney disease, unspecified: Secondary | ICD-10-CM | POA: Diagnosis not present

## 2021-09-09 DIAGNOSIS — R4182 Altered mental status, unspecified: Secondary | ICD-10-CM | POA: Diagnosis present

## 2021-09-09 DIAGNOSIS — Z940 Kidney transplant status: Secondary | ICD-10-CM | POA: Diagnosis not present

## 2021-09-09 DIAGNOSIS — Z7901 Long term (current) use of anticoagulants: Secondary | ICD-10-CM | POA: Insufficient documentation

## 2021-09-09 DIAGNOSIS — E1122 Type 2 diabetes mellitus with diabetic chronic kidney disease: Secondary | ICD-10-CM | POA: Insufficient documentation

## 2021-09-09 DIAGNOSIS — D849 Immunodeficiency, unspecified: Secondary | ICD-10-CM | POA: Diagnosis not present

## 2021-09-09 DIAGNOSIS — F05 Delirium due to known physiological condition: Secondary | ICD-10-CM

## 2021-09-09 DIAGNOSIS — I129 Hypertensive chronic kidney disease with stage 1 through stage 4 chronic kidney disease, or unspecified chronic kidney disease: Secondary | ICD-10-CM | POA: Insufficient documentation

## 2021-09-09 DIAGNOSIS — L03113 Cellulitis of right upper limb: Secondary | ICD-10-CM

## 2021-09-09 DIAGNOSIS — R410 Disorientation, unspecified: Secondary | ICD-10-CM | POA: Diagnosis not present

## 2021-09-09 LAB — BASIC METABOLIC PANEL
Anion gap: 5 (ref 5–15)
BUN: 56 mg/dL — ABNORMAL HIGH (ref 8–23)
CO2: 22 mmol/L (ref 22–32)
Calcium: 8.8 mg/dL — ABNORMAL LOW (ref 8.9–10.3)
Chloride: 115 mmol/L — ABNORMAL HIGH (ref 98–111)
Creatinine, Ser: 3.85 mg/dL — ABNORMAL HIGH (ref 0.61–1.24)
GFR, Estimated: 17 mL/min — ABNORMAL LOW (ref >60)
Glucose, Bld: 132 mg/dL — ABNORMAL HIGH (ref 70–99)
Potassium: 4.7 mmol/L (ref 3.5–5.1)
Sodium: 142 mmol/L (ref 135–145)

## 2021-09-09 LAB — BLOOD GAS, VENOUS
Acid-base deficit: 2.6 mmol/L — ABNORMAL HIGH (ref 0.0–2.0)
Bicarbonate: 23.2 mmol/L (ref 20.0–28.0)
O2 Saturation: 84.7 %
Patient temperature: 37
pCO2, Ven: 43 mmHg — ABNORMAL LOW (ref 44–60)
pH, Ven: 7.34 (ref 7.25–7.43)
pO2, Ven: 52 mmHg — ABNORMAL HIGH (ref 32–45)

## 2021-09-09 LAB — BRAIN NATRIURETIC PEPTIDE: B Natriuretic Peptide: 1502.7 pg/mL — ABNORMAL HIGH (ref 0.0–100.0)

## 2021-09-09 LAB — TYPE AND SCREEN
ABO/RH(D): B POS
Antibody Screen: NEGATIVE

## 2021-09-09 LAB — CBC
HCT: 26.7 % — ABNORMAL LOW (ref 39.0–52.0)
Hemoglobin: 8.1 g/dL — ABNORMAL LOW (ref 13.0–17.0)
MCH: 23.7 pg — ABNORMAL LOW (ref 26.0–34.0)
MCHC: 30.3 g/dL (ref 30.0–36.0)
MCV: 78.1 fL — ABNORMAL LOW (ref 80.0–100.0)
Platelets: 209 10*3/uL (ref 150–400)
RBC: 3.42 MIL/uL — ABNORMAL LOW (ref 4.22–5.81)
RDW: 21.6 % — ABNORMAL HIGH (ref 11.5–15.5)
WBC: 5.7 10*3/uL (ref 4.0–10.5)
nRBC: 0 % (ref 0.0–0.2)

## 2021-09-09 LAB — LACTIC ACID, PLASMA: Lactic Acid, Venous: 0.8 mmol/L (ref 0.5–1.9)

## 2021-09-09 MED ORDER — SODIUM CHLORIDE 0.9 % IV SOLN
2.0000 g | Freq: Once | INTRAVENOUS | Status: AC
  Administered 2021-09-09: 2 g via INTRAVENOUS
  Filled 2021-09-09: qty 12.5

## 2021-09-09 MED ORDER — LINEZOLID 600 MG/300ML IV SOLN
600.0000 mg | Freq: Two times a day (BID) | INTRAVENOUS | Status: DC
  Filled 2021-09-09: qty 300

## 2021-09-09 MED ORDER — BACITRACIN ZINC 500 UNIT/GM EX OINT
TOPICAL_OINTMENT | Freq: Two times a day (BID) | CUTANEOUS | Status: DC
  Filled 2021-09-09: qty 0.9

## 2021-09-09 NOTE — ED Provider Notes (Signed)
[facility], [place] Provider Note    Event Date/Time   First MD Initiated Contact with Patient 09/09/21 1500     (approximate)   History   Loss of Consciousness   HPI  Jared Gregory is a 66 y.o. male here with altered mental status.  Patient has a history of kidney transplant on immunosuppressants, hypertension, hyperlipidemia, diabetes, A-fib on Eliquis, CKD, here with altered mental status.  Patient reportedly was found altered in his room at the nursing facility.  He is a patient of the [place] but stays at a skilled nursing facility in the area.  He was just hospitalized for UTI and pneumonia.  Patient reportedly was hypoxic and disoriented on their arrival.  Here, patient falls asleep very quickly but denies any pain.  He has had a history of seizures with somewhat similar symptoms.  Daughter, who is present at bedside, states that patient has been drowsy/confused like this after seizures, but does feel like his speech and left face are slightly worse than they had been previously.  Denies any headache on my assessment.  Remainder of history limited due to drowsiness.  Physical Exam   Triage Vital Signs: ED Triage Vitals  Enc Vitals Group     BP 09/09/21 1434 (!) 165/89     Pulse Rate 09/09/21 1434 71     Resp 09/09/21 1434 12     Temp 09/09/21 1434 98.3 F (36.8 C)     Temp Source 09/09/21 1434 Axillary     SpO2 09/09/21 1433 100 %     Weight 09/09/21 1437 148 lb (67.1 kg)     Height 09/09/21 1437 '5\' 9"'$  (1.753 m)     Head Circumference --      Peak Flow --      Pain Score 09/09/21 1435 6     Pain Loc --      Pain Edu? --      Excl. in [place]? --     Most recent vital signs: Vitals:   09/09/21 1837 09/09/21 1848  BP:  (!) 156/68  Pulse:  76  Resp:  13  Temp: (!) 97.5 F (36.4 C) (!) 97.5 F (36.4 C)  SpO2:  100%     General: Awake, no distress.  CV:  Good peripheral perfusion.  Resp:  Normal effort.  Lungs with bibasilar Rales, normal work of  breathing. Abd:  No distention.  No tenderness. Other:  Right upper extremity with superficial ulcerations along the forearm, significant edema and bruising noted along the medial aspect of the arm.  No purulence.  Mild warmth.   ED Results / Procedures / Treatments   Labs (all labs ordered are listed, but only abnormal results are displayed) Labs Reviewed  BASIC METABOLIC PANEL - Abnormal; Notable for the following components:      Result Value   Chloride 115 (*)    Glucose, Bld 132 (*)    BUN 56 (*)    Creatinine, Ser 3.85 (*)    Calcium 8.8 (*)    GFR, Estimated 17 (*)    All other components within normal limits  CBC - Abnormal; Notable for the following components:   RBC 3.42 (*)    Hemoglobin 8.1 (*)    HCT 26.7 (*)    MCV 78.1 (*)    MCH 23.7 (*)    RDW 21.6 (*)    All other components within normal limits  BLOOD GAS, VENOUS - Abnormal; Notable for the following components:  pCO2, Ven 43 (*)    pO2, Ven 52 (*)    Acid-base deficit 2.6 (*)    All other components within normal limits  BRAIN NATRIURETIC PEPTIDE - Abnormal; Notable for the following components:   B Natriuretic Peptide 1,502.7 (*)    All other components within normal limits  CULTURE, BLOOD (SINGLE)  LACTIC ACID, PLASMA  URINALYSIS, ROUTINE W REFLEX MICROSCOPIC  MYCOPHENOLIC ACID (CELLCEPT)  LACOSAMIDE  TYPE AND SCREEN     EKG Normal sinus rhythm, ventricular at 71.  PR 171, QRS 98, QTc 456.  No acute ST elevations or depressions.   RADIOLOGY Chest x-ray: Cardiomegaly, mild pulmonary vascular congestion CT head: No acute intracranial abnormality   I also independently reviewed and agree with radiologist interpretations.   PROCEDURES:  Critical Care performed: No  .1-3 Lead EKG Interpretation  Performed by: Duffy Bruce, MD Authorized by: Duffy Bruce, MD     Interpretation: normal     ECG rate:  70-90   ECG rate assessment: normal     Rhythm: sinus rhythm     Ectopy: none      Conduction: normal   Comments:     Indication: Weakness      MEDICATIONS ORDERED IN ED: Medications  linezolid (ZYVOX) IVPB 600 mg (has no administration in time range)  bacitracin ointment ( Topical Given 09/09/21 1834)  ceFEPIme (MAXIPIME) 2 g in sodium chloride 0.9 % 100 mL IVPB (0 g Intravenous Stopped 09/09/21 1850)     IMPRESSION / MDM / ASSESSMENT AND PLAN / ED COURSE  I reviewed the triage vital signs and the nursing notes.                               The patient is on the cardiac monitor to evaluate for evidence of arrhythmia and/or significant heart rate changes.   Ddx:  Differential includes the following, with pertinent life- or limb-threatening emergencies considered:  Post ictal status, sepsis, uremia, stroke, status epilepticus, encephalopathy due to polypharmacy or other occult infection  Patient's presentation is most consistent with acute presentation with potential threat to life or bodily function.  MDM:  66 year old male with extensive past medical history as above including renal transplant here with altered mental status.  Clinically, I have a high suspicion for seizure with postictal confusion, though concern that this could have been precipitated in the setting of acute infection of his right arm.  The patient was just hospitalized, I extensively reviewed his records.  He reportedly had an IV below during that stay and has had a wound as well as swelling to the right arm since then.  Clinically here, he appears to have superficial ulceration and significant edema with some slight warmth on exam.  Concern for possible sepsis from cellulitis given his clinical picture and immunosuppression.  He has no leukocytosis here.  He does not appear septic clinically.  Renal function is actually improving from his last visit.  He does appear to be mildly edematous and chest x-ray shows some mild edema with BNP 1500 but will hold on diuresis in the setting of his  tenuous renal status as well as possible sepsis.  No focal abscess or evidence to suggest necrotizing infection of his arm.  CT head is negative.  He does have mildly slurred speech but it is unclear whether this is secondary to his old deficits versus a new stroke.  He is well outside the window with  no known last normal.  May benefit from MRI as an inpatient.  Had a long discussion with the patient's daughter as well as sister via telephone.  They are very adamant that he goes to the [place] which I think is reasonable given his complex medical history and extensive history with them.  Patient is hemodynamically stable.  Protecting his airway.  I discussed with Dr. Verl Blalock at the [facility] who has thankfully excepted the patient.  Patient be transferred via EMS.  MEDICATIONS GIVEN IN ED: Medications  linezolid (ZYVOX) IVPB 600 mg (has no administration in time range)  bacitracin ointment ( Topical Given 09/09/21 1834)  ceFEPIme (MAXIPIME) 2 g in sodium chloride 0.9 % 100 mL IVPB (0 g Intravenous Stopped 09/09/21 1850)     Consults:     EMR reviewed  Reviewed recent admission, including discharge summary.  Reviewed nursing notes for documentation of right arm.     FINAL CLINICAL IMPRESSION(S) / ED DIAGNOSES   Final diagnoses:  Immunosuppressed status ([place])  Cellulitis of right upper extremity  Post-ictal confusion     Rx / DC Orders   ED Discharge Orders     None        Note:  This document was prepared using Dragon voice recognition software and may include unintentional dictation errors.   Duffy Bruce, MD 09/09/21 Einar Crow

## 2021-09-09 NOTE — ED Notes (Signed)
Informed sister Helene Kelp that pt was accepted to [facility] hospital.

## 2021-09-09 NOTE — ED Notes (Signed)
Called lab to clarify order for mycocephalic acid. Per lab staff, send red top and SST.

## 2021-09-09 NOTE — Progress Notes (Signed)
PHARMACY -  BRIEF ANTIBIOTIC NOTE   Pharmacy has received consult(s) for Cefepime from an ED provider.  The patient's profile has been reviewed for ht/wt/allergies/indication/available labs.    One time order(s) placed for Cefepime 2g  Further antibiotics/pharmacy consults should be ordered by admitting physician if indicated.                       Thank you, Vira Blanco 09/09/2021  6:18 PM

## 2021-09-09 NOTE — ED Triage Notes (Signed)
Pt to ED via [facility] from [facility]. found unresponsive by SNF staff this afternoon. Pt A&O times 4 per EMS.  Staff stated to EMS that PT has hx of seizures and kidney transplant and had a SPO2 of 74% on RA, placed him on 10L.  EMS CBG was 117 Pt c/o R Arm pain.  RA appears swollen. Pt has bandage to right wrist with today's date. Pt's LA with compression sleave and is restricted d/t swelling. Pt answers questions appropriately but falling asleep between questions.

## 2021-09-09 NOTE — ED Notes (Signed)
Called and sent referral to [place] center

## 2021-09-09 NOTE — ED Notes (Signed)
Had to stop cefepime so EMS can transport to [place].

## 2021-09-12 LAB — MYCOPHENOLIC ACID (CELLCEPT)
MPA Glucuronide: 27 ug/mL (ref 15–125)
MPA: 0.2 ug/mL — ABNORMAL LOW (ref 1.0–3.5)

## 2021-09-14 LAB — CULTURE, BLOOD (SINGLE): Culture: NO GROWTH

## 2021-09-15 LAB — LACOSAMIDE: Lacosamide: 14.3 ug/mL — ABNORMAL HIGH (ref 5.0–10.0)

## 2021-11-20 ENCOUNTER — Other Ambulatory Visit: Payer: Self-pay

## 2021-11-20 ENCOUNTER — Encounter: Payer: Self-pay | Admitting: Emergency Medicine

## 2021-11-20 DIAGNOSIS — Z79899 Other long term (current) drug therapy: Secondary | ICD-10-CM | POA: Diagnosis not present

## 2021-11-20 DIAGNOSIS — R799 Abnormal finding of blood chemistry, unspecified: Secondary | ICD-10-CM | POA: Diagnosis present

## 2021-11-20 DIAGNOSIS — F0150 Vascular dementia without behavioral disturbance: Secondary | ICD-10-CM | POA: Diagnosis not present

## 2021-11-20 DIAGNOSIS — D649 Anemia, unspecified: Secondary | ICD-10-CM | POA: Diagnosis not present

## 2021-11-20 DIAGNOSIS — I4891 Unspecified atrial fibrillation: Secondary | ICD-10-CM | POA: Diagnosis not present

## 2021-11-20 DIAGNOSIS — I5031 Acute diastolic (congestive) heart failure: Secondary | ICD-10-CM | POA: Diagnosis not present

## 2021-11-20 DIAGNOSIS — E1122 Type 2 diabetes mellitus with diabetic chronic kidney disease: Secondary | ICD-10-CM | POA: Diagnosis not present

## 2021-11-20 DIAGNOSIS — I130 Hypertensive heart and chronic kidney disease with heart failure and stage 1 through stage 4 chronic kidney disease, or unspecified chronic kidney disease: Secondary | ICD-10-CM | POA: Insufficient documentation

## 2021-11-20 DIAGNOSIS — N184 Chronic kidney disease, stage 4 (severe): Secondary | ICD-10-CM | POA: Diagnosis not present

## 2021-11-20 DIAGNOSIS — Z7901 Long term (current) use of anticoagulants: Secondary | ICD-10-CM | POA: Insufficient documentation

## 2021-11-20 DIAGNOSIS — Z940 Kidney transplant status: Secondary | ICD-10-CM | POA: Diagnosis not present

## 2021-11-20 LAB — CBC WITH DIFFERENTIAL/PLATELET
Abs Immature Granulocytes: 0.01 10*3/uL (ref 0.00–0.07)
Basophils Absolute: 0 10*3/uL (ref 0.0–0.1)
Basophils Relative: 0 %
Eosinophils Absolute: 0.1 10*3/uL (ref 0.0–0.5)
Eosinophils Relative: 3 %
HCT: 23.7 % — ABNORMAL LOW (ref 39.0–52.0)
Hemoglobin: 7.3 g/dL — ABNORMAL LOW (ref 13.0–17.0)
Immature Granulocytes: 0 %
Lymphocytes Relative: 9 %
Lymphs Abs: 0.3 10*3/uL — ABNORMAL LOW (ref 0.7–4.0)
MCH: 22.7 pg — ABNORMAL LOW (ref 26.0–34.0)
MCHC: 30.8 g/dL (ref 30.0–36.0)
MCV: 73.6 fL — ABNORMAL LOW (ref 80.0–100.0)
Monocytes Absolute: 0.3 10*3/uL (ref 0.1–1.0)
Monocytes Relative: 8 %
Neutro Abs: 3.2 10*3/uL (ref 1.7–7.7)
Neutrophils Relative %: 80 %
Platelets: 153 10*3/uL (ref 150–400)
RBC: 3.22 MIL/uL — ABNORMAL LOW (ref 4.22–5.81)
RDW: 22.6 % — ABNORMAL HIGH (ref 11.5–15.5)
WBC: 4 10*3/uL (ref 4.0–10.5)
nRBC: 0 % (ref 0.0–0.2)

## 2021-11-20 LAB — BASIC METABOLIC PANEL
Anion gap: 10 (ref 5–15)
BUN: 74 mg/dL — ABNORMAL HIGH (ref 8–23)
CO2: 21 mmol/L — ABNORMAL LOW (ref 22–32)
Calcium: 8.4 mg/dL — ABNORMAL LOW (ref 8.9–10.3)
Chloride: 109 mmol/L (ref 98–111)
Creatinine, Ser: 3.42 mg/dL — ABNORMAL HIGH (ref 0.61–1.24)
GFR, Estimated: 19 mL/min — ABNORMAL LOW (ref >60)
Glucose, Bld: 185 mg/dL — ABNORMAL HIGH (ref 70–99)
Potassium: 4.2 mmol/L (ref 3.5–5.1)
Sodium: 140 mmol/L (ref 135–145)

## 2021-11-20 NOTE — ED Triage Notes (Signed)
Pt to ED via [facility] from [facility] for  abnormal labs. Pt states that his Hgb was 6 and he was told that he needed to come get a blood transfusion. Pt is A & O and in NAD.

## 2021-11-20 NOTE — ED Triage Notes (Signed)
First nurse Note:  Arrives from [facility].  Blood work done H:  6.8.  Sent to ED for transfusion.  VS wnl.

## 2021-11-21 ENCOUNTER — Emergency Department: Payer: No Typology Code available for payment source

## 2021-11-21 ENCOUNTER — Observation Stay (HOSPITAL_BASED_OUTPATIENT_CLINIC_OR_DEPARTMENT_OTHER)
Admit: 2021-11-21 | Discharge: 2021-11-21 | Disposition: A | Discharge status: Home / Self Care | Payer: No Typology Code available for payment source | Attending: Internal Medicine | Admitting: Internal Medicine

## 2021-11-21 ENCOUNTER — Observation Stay
Admission: EM | Admit: 2021-11-21 | Discharge: 2021-11-21 | Disposition: A | Discharge status: Skilled Nursing Facility | Payer: No Typology Code available for payment source | Attending: Internal Medicine | Admitting: Internal Medicine

## 2021-11-21 DIAGNOSIS — E785 Hyperlipidemia, unspecified: Secondary | ICD-10-CM | POA: Diagnosis present

## 2021-11-21 DIAGNOSIS — N184 Chronic kidney disease, stage 4 (severe): Secondary | ICD-10-CM | POA: Diagnosis not present

## 2021-11-21 DIAGNOSIS — D649 Anemia, unspecified: Secondary | ICD-10-CM | POA: Diagnosis not present

## 2021-11-21 DIAGNOSIS — I5031 Acute diastolic (congestive) heart failure: Secondary | ICD-10-CM

## 2021-11-21 DIAGNOSIS — I482 Chronic atrial fibrillation, unspecified: Secondary | ICD-10-CM | POA: Diagnosis not present

## 2021-11-21 DIAGNOSIS — Z940 Kidney transplant status: Secondary | ICD-10-CM

## 2021-11-21 DIAGNOSIS — I4891 Unspecified atrial fibrillation: Secondary | ICD-10-CM | POA: Diagnosis present

## 2021-11-21 DIAGNOSIS — I10 Essential (primary) hypertension: Secondary | ICD-10-CM | POA: Diagnosis present

## 2021-11-21 DIAGNOSIS — N400 Enlarged prostate without lower urinary tract symptoms: Secondary | ICD-10-CM | POA: Diagnosis present

## 2021-11-21 DIAGNOSIS — E1129 Type 2 diabetes mellitus with other diabetic kidney complication: Secondary | ICD-10-CM | POA: Diagnosis present

## 2021-11-21 LAB — HEMOGLOBIN A1C
Hgb A1c MFr Bld: 5.3 % (ref 4.8–5.6)
Mean Plasma Glucose: 105.41 mg/dL

## 2021-11-21 LAB — BASIC METABOLIC PANEL
Anion gap: 9 (ref 5–15)
BUN: 74 mg/dL — ABNORMAL HIGH (ref 8–23)
CO2: 20 mmol/L — ABNORMAL LOW (ref 22–32)
Calcium: 8.4 mg/dL — ABNORMAL LOW (ref 8.9–10.3)
Chloride: 110 mmol/L (ref 98–111)
Creatinine, Ser: 3.23 mg/dL — ABNORMAL HIGH (ref 0.61–1.24)
GFR, Estimated: 21 mL/min — ABNORMAL LOW (ref >60)
Glucose, Bld: 125 mg/dL — ABNORMAL HIGH (ref 70–99)
Potassium: 4.1 mmol/L (ref 3.5–5.1)
Sodium: 139 mmol/L (ref 135–145)

## 2021-11-21 LAB — CBC
HCT: 22.7 % — ABNORMAL LOW (ref 39.0–52.0)
Hemoglobin: 7.2 g/dL — ABNORMAL LOW (ref 13.0–17.0)
MCH: 23.4 pg — ABNORMAL LOW (ref 26.0–34.0)
MCHC: 31.7 g/dL (ref 30.0–36.0)
MCV: 73.7 fL — ABNORMAL LOW (ref 80.0–100.0)
Platelets: 158 10*3/uL (ref 150–400)
RBC: 3.08 MIL/uL — ABNORMAL LOW (ref 4.22–5.81)
RDW: 22.5 % — ABNORMAL HIGH (ref 11.5–15.5)
WBC: 3.8 10*3/uL — ABNORMAL LOW (ref 4.0–10.5)
nRBC: 0 % (ref 0.0–0.2)

## 2021-11-21 LAB — PROTIME-INR
INR: 1.6 — ABNORMAL HIGH (ref 0.8–1.2)
Prothrombin Time: 18.9 seconds — ABNORMAL HIGH (ref 11.4–15.2)

## 2021-11-21 LAB — CBG MONITORING, ED
Glucose-Capillary: 120 mg/dL — ABNORMAL HIGH (ref 70–99)
Glucose-Capillary: 129 mg/dL — ABNORMAL HIGH (ref 70–99)

## 2021-11-21 LAB — ECHOCARDIOGRAM COMPLETE
AR max vel: 1.09 cm2
AV Area VTI: 1.17 cm2
AV Area mean vel: 1.14 cm2
AV Mean grad: 8 mmHg
AV Peak grad: 16.6 mmHg
Ao pk vel: 2.04 m/s
Area-P 1/2: 2.53 cm2
S' Lateral: 2.6 cm

## 2021-11-21 LAB — HEMOGLOBIN: Hemoglobin: 8.4 g/dL — ABNORMAL LOW (ref 13.0–17.0)

## 2021-11-21 LAB — TROPONIN I (HIGH SENSITIVITY): Troponin I (High Sensitivity): 30 ng/L — ABNORMAL HIGH (ref <18)

## 2021-11-21 LAB — PREPARE RBC (CROSSMATCH)

## 2021-11-21 LAB — BRAIN NATRIURETIC PEPTIDE: B Natriuretic Peptide: 1687.6 pg/mL — ABNORMAL HIGH (ref 0.0–100.0)

## 2021-11-21 MED ORDER — FUROSEMIDE 10 MG/ML IJ SOLN: 20.0000 mg | Freq: Two times a day (BID) | INTRAMUSCULAR | Status: DC

## 2021-11-21 MED ORDER — HYDRALAZINE HCL 50 MG PO TABS
75.0000 mg | ORAL_TABLET | Freq: Once | ORAL | Status: AC
  Administered 2021-11-21: 75 mg via ORAL
  Filled 2021-11-21: qty 2

## 2021-11-21 MED ORDER — FOLIC ACID 1 MG PO TABS
1.0000 mg | ORAL_TABLET | Freq: Every day | ORAL | Status: DC
  Administered 2021-11-21: 1 mg via ORAL
  Filled 2021-11-21: qty 1

## 2021-11-21 MED ORDER — AMLODIPINE BESYLATE 5 MG PO TABS
10.0000 mg | ORAL_TABLET | Freq: Every day | ORAL | Status: DC
  Administered 2021-11-21: 10 mg via ORAL
  Filled 2021-11-21: qty 2

## 2021-11-21 MED ORDER — FUROSEMIDE 10 MG/ML IJ SOLN
40.0000 mg | Freq: Two times a day (BID) | INTRAMUSCULAR | Status: DC
  Administered 2021-11-21: 40 mg via INTRAVENOUS
  Filled 2021-11-21: qty 4

## 2021-11-21 MED ORDER — MYCOPHENOLATE SODIUM 180 MG PO TBEC
180.0000 mg | DELAYED_RELEASE_TABLET | Freq: Once | ORAL | Status: AC
  Administered 2021-11-21: 180 mg via ORAL
  Filled 2021-11-21: qty 1

## 2021-11-21 MED ORDER — ONDANSETRON HCL 4 MG/2ML IJ SOLN: 4.0000 mg | Freq: Four times a day (QID) | INTRAMUSCULAR | Status: DC | PRN

## 2021-11-21 MED ORDER — CYCLOSPORINE 25 MG PO CAPS
75.0000 mg | ORAL_CAPSULE | Freq: Once | ORAL | Status: AC
  Administered 2021-11-21: 75 mg via ORAL
  Filled 2021-11-21: qty 3

## 2021-11-21 MED ORDER — ATORVASTATIN CALCIUM 20 MG PO TABS
10.0000 mg | ORAL_TABLET | Freq: Every day | ORAL | Status: DC
  Administered 2021-11-21: 10 mg via ORAL
  Filled 2021-11-21: qty 1

## 2021-11-21 MED ORDER — INSULIN ASPART 100 UNIT/ML IJ SOLN: 0.0000 [IU] | Freq: Every day | INTRAMUSCULAR | Status: DC

## 2021-11-21 MED ORDER — CARVEDILOL 25 MG PO TABS
25.0000 mg | ORAL_TABLET | Freq: Once | ORAL | Status: AC
  Administered 2021-11-21: 25 mg via ORAL
  Filled 2021-11-21: qty 1

## 2021-11-21 MED ORDER — ACETAMINOPHEN 650 MG RE SUPP: 650.0000 mg | Freq: Four times a day (QID) | RECTAL | Status: DC | PRN

## 2021-11-21 MED ORDER — MYCOPHENOLATE SODIUM 180 MG PO TBEC
180.0000 mg | DELAYED_RELEASE_TABLET | Freq: Two times a day (BID) | ORAL | Status: DC
  Filled 2021-11-21: qty 1

## 2021-11-21 MED ORDER — INSULIN ASPART 100 UNIT/ML IJ SOLN: 0.0000 [IU] | Freq: Three times a day (TID) | INTRAMUSCULAR | Status: DC

## 2021-11-21 MED ORDER — HYDRALAZINE HCL 50 MG PO TABS: 75.0000 mg | ORAL_TABLET | Freq: Three times a day (TID) | ORAL | Status: DC

## 2021-11-21 MED ORDER — SODIUM BICARBONATE 650 MG PO TABS
650.0000 mg | ORAL_TABLET | Freq: Once | ORAL | Status: AC
  Administered 2021-11-21: 650 mg via ORAL
  Filled 2021-11-21: qty 1

## 2021-11-21 MED ORDER — FINASTERIDE 5 MG PO TABS
5.0000 mg | ORAL_TABLET | Freq: Every day | ORAL | Status: DC
  Administered 2021-11-21: 5 mg via ORAL
  Filled 2021-11-21: qty 1

## 2021-11-21 MED ORDER — FUROSEMIDE 10 MG/ML IJ SOLN
20.0000 mg | Freq: Once | INTRAMUSCULAR | Status: AC
  Administered 2021-11-21: 20 mg via INTRAVENOUS
  Filled 2021-11-21: qty 4

## 2021-11-21 MED ORDER — APIXABAN 5 MG PO TABS
5.0000 mg | ORAL_TABLET | Freq: Two times a day (BID) | ORAL | Status: DC
  Administered 2021-11-21: 5 mg via ORAL
  Filled 2021-11-21: qty 1

## 2021-11-21 MED ORDER — SODIUM CHLORIDE 0.9 % IV SOLN
10.0000 mL/h | Freq: Once | INTRAVENOUS | Status: AC
  Administered 2021-11-21: 10 mL/h via INTRAVENOUS

## 2021-11-21 MED ORDER — ONDANSETRON HCL 4 MG PO TABS: 4.0000 mg | ORAL_TABLET | Freq: Four times a day (QID) | ORAL | Status: DC | PRN

## 2021-11-21 MED ORDER — LACOSAMIDE 50 MG PO TABS: 150.0000 mg | ORAL_TABLET | Freq: Two times a day (BID) | ORAL | Status: DC

## 2021-11-21 MED ORDER — CYCLOSPORINE 25 MG PO CAPS
75.0000 mg | ORAL_CAPSULE | Freq: Two times a day (BID) | ORAL | Status: DC
  Filled 2021-11-21 (×2): qty 3

## 2021-11-21 MED ORDER — TAMSULOSIN HCL 0.4 MG PO CAPS
0.8000 mg | ORAL_CAPSULE | Freq: Every day | ORAL | Status: DC
  Administered 2021-11-21: 0.8 mg via ORAL
  Filled 2021-11-21: qty 2

## 2021-11-21 MED ORDER — DULOXETINE HCL 60 MG PO CPEP
60.0000 mg | ORAL_CAPSULE | Freq: Every day | ORAL | Status: DC
  Administered 2021-11-21: 60 mg via ORAL
  Filled 2021-11-21: qty 1

## 2021-11-21 MED ORDER — ACETAMINOPHEN 325 MG PO TABS: 650.0000 mg | ORAL_TABLET | Freq: Four times a day (QID) | ORAL | Status: DC | PRN

## 2021-11-21 MED ORDER — CARVEDILOL 6.25 MG PO TABS: 25.0000 mg | ORAL_TABLET | Freq: Two times a day (BID) | ORAL | Status: DC

## 2021-11-21 MED ORDER — FERROUS SULFATE 325 (65 FE) MG PO TABS
325.0000 mg | ORAL_TABLET | Freq: Every day | ORAL | Status: DC
  Administered 2021-11-21: 325 mg via ORAL
  Filled 2021-11-21: qty 1

## 2021-11-21 MED ORDER — FLUTICASONE FUROATE-VILANTEROL 200-25 MCG/ACT IN AEPB
1.0000 | INHALATION_SPRAY | Freq: Every day | RESPIRATORY_TRACT | Status: DC
  Filled 2021-11-21: qty 28

## 2021-11-21 MED ORDER — DARBEPOETIN ALFA 25 MCG/0.42ML IJ SOSY: 25.0000 ug | PREFILLED_SYRINGE | INTRAMUSCULAR | Status: DC

## 2021-11-21 MED ORDER — FUROSEMIDE 20 MG PO TABS: 20.0000 mg | ORAL_TABLET | Freq: Every day | ORAL | 11 refills | Status: DC

## 2021-11-21 NOTE — NC FL2 (Signed)
[place] LEVEL OF CARE SCREENING TOOL     IDENTIFICATION  Patient Name: Jared Gregory Birthdate: 1955/08/05 Sex: male Admission Date (Current Location): 11/21/2021  [facility] and [place] Number:  Engineering geologist and Address:  [facility], [address], [place], [place] [address]      Provider Number: [id number]  Attending Physician Name and Address:  Annita Brod, MD  Relative Name and Phone Number:  Jared Gregory, spouse- [phone]    Current Level of Care: Hospital Recommended Level of Care: [place] Prior Approval Number:    Date Approved/Denied:   PASRR Number:    Discharge Plan: SNF    Current Diagnoses: Patient Active Problem List   Diagnosis Date Noted   Symptomatic anemia 41/93/7902   Acute diastolic (congestive) heart failure ([place]) 11/21/2021   Atrial fibrillation ([place]) 09/02/2021   HLD (hyperlipidemia)    Type II diabetes mellitus with renal manifestations ([place])    CKD (chronic kidney disease), stage IV (HCC)    Anemia in chronic kidney disease (CKD)    Renal transplant recipient    Seizure-like activity ([place]) 01/10/2021   Depression 01/10/2021   Essential hypertension 01/10/2021   On continuous oral anticoagulation 01/10/2021   BPH (benign prostatic hyperplasia) 01/10/2021    Orientation RESPIRATION BLADDER Height & Weight     Self, Time, Situation, Place  Normal Incontinent Weight:   Height:     BEHAVIORAL SYMPTOMS/MOOD NEUROLOGICAL BOWEL NUTRITION STATUS      Incontinent Diet (see discharge summary)  AMBULATORY STATUS COMMUNICATION OF NEEDS Skin   Total Care Verbally Normal                       Personal Care Assistance Level of Assistance  Total care       Total Care Assistance: Maximum assistance   Functional Limitations Info  Sight, Hearing, Speech Sight Info: Adequate Hearing Info: Adequate Speech Info: Adequate    SPECIAL CARE FACTORS FREQUENCY                        Contractures Contractures Info: Not present    Additional Factors Info  Code Status Code Status Info: Full             Current Medications (11/21/2021):  This is the current hospital active medication list Current Facility-Administered Medications  Medication Dose Route Frequency Provider Last Rate Last Admin   acetaminophen (TYLENOL) tablet 650 mg  650 mg Oral Q6H PRN Athena Masse, MD       Or   acetaminophen (TYLENOL) suppository 650 mg  650 mg Rectal Q6H PRN Athena Masse, MD       amLODipine (NORVASC) tablet 10 mg  10 mg Oral Daily Judd Gaudier V, MD   10 mg at 11/21/21 0955   apixaban (ELIQUIS) tablet 5 mg  5 mg Oral BID Annita Brod, MD   5 mg at 11/21/21 1158   atorvastatin (LIPITOR) tablet 10 mg  10 mg Oral Daily Judd Gaudier V, MD   10 mg at 11/21/21 0955   carvedilol (COREG) tablet 25 mg  25 mg Oral BID WC Annita Brod, MD       cycloSPORINE (SANDIMMUNE) capsule 75 mg  75 mg Oral BID Annita Brod, MD       [START ON 11/22/2021] Darbepoetin Alfa (ARANESP) injection 25 mcg  25 mcg Subcutaneous Q7 days Annita Brod, MD  DULoxetine (CYMBALTA) DR capsule 60 mg  60 mg Oral Daily Judd Gaudier V, MD   60 mg at 11/21/21 0955   ferrous sulfate tablet 325 mg  325 mg Oral Q breakfast Athena Masse, MD   325 mg at 11/21/21 0831   finasteride (PROSCAR) tablet 5 mg  5 mg Oral Daily Judd Gaudier V, MD   5 mg at 11/21/21 1158   [START ON 11/22/2021] fluticasone furoate-vilanterol (BREO ELLIPTA) 200-25 MCG/ACT 1 puff  1 puff Inhalation Daily Annita Brod, MD       folic acid (FOLVITE) tablet 1 mg  1 mg Oral Daily Judd Gaudier V, MD   1 mg at 11/21/21 0955   furosemide (LASIX) injection 40 mg  40 mg Intravenous BID Annita Brod, MD   40 mg at 11/21/21 1158   hydrALAZINE (APRESOLINE) tablet 75 mg  75 mg Oral Q8H Annita Brod, MD       insulin aspart (novoLOG) injection 0-5 Units  0-5 Units Subcutaneous QHS  Judd Gaudier V, MD       insulin aspart (novoLOG) injection 0-6 Units  0-6 Units Subcutaneous TID WC Athena Masse, MD       lacosamide (VIMPAT) tablet 150 mg  150 mg Oral Q12H Annita Brod, MD       mycophenolate (MYFORTIC) EC tablet 180 mg  180 mg Oral BID Annita Brod, MD       ondansetron [facility]) tablet 4 mg  4 mg Oral Q6H PRN Athena Masse, MD       Or   ondansetron [facility]) injection 4 mg  4 mg Intravenous Q6H PRN Athena Masse, MD       tamsulosin [facility]) capsule 0.8 mg  0.8 mg Oral Daily Judd Gaudier V, MD   0.8 mg at 11/21/21 1740   Current Outpatient Medications  Medication Sig Dispense Refill   amLODipine (NORVASC) 10 MG tablet Take 10 mg by mouth daily.     ascorbic acid (VITAMIN C) 500 MG tablet Take 500 mg by mouth daily.     atorvastatin (LIPITOR) 10 MG tablet Take 10 mg by mouth daily.     carvedilol (COREG) 25 MG tablet Take 25 mg by mouth 2 (two) times daily with a meal.     Cholecalciferol (VITAMIN D3) 50 MCG (2000 UT) TABS Take 1 tablet by mouth in the morning and at bedtime.     cycloSPORINE (SANDIMMUNE) 25 MG capsule Take 3 capsules by mouth 2 (two) times daily. Please give at 0900 and 2100 per Transplant Team.     diclofenac Sodium (VOLTAREN) 1 % GEL Apply 2 g topically 4 (four) times daily. Apply to right great toe.     DULoxetine (CYMBALTA) 60 MG capsule Take 60 mg by mouth daily.     ELIQUIS 5 MG TABS tablet Take 5 mg by mouth 2 (two) times daily.     epoetin alfa (EPOGEN) 10000 UNIT/ML injection Inject 10,000 Units into the skin every Wednesday.     ferrous sulfate 324 (65 Fe) MG TBEC Take 324 mg by mouth daily. Take at lunch time along with vitamin C.     finasteride (PROSCAR) 5 MG tablet Take 5 mg by mouth daily.     fluticasone furoate-vilanterol (BREO ELLIPTA) 200-25 MCG/ACT AEPB Inhale 1 puff into the lungs daily. 1 each 0   folic acid (FOLVITE) 1 MG tablet Take 1 tablet (1 mg total) by mouth daily. 30 tablet 0   furosemide (LASIX)  20 MG  tablet Take 1 tablet (20 mg total) by mouth daily. 30 tablet 11   hydrALAZINE (APRESOLINE) 25 MG tablet Take 75 mg by mouth 3 (three) times daily.     Lacosamide 150 MG TABS Take 1 tablet by mouth every 12 (twelve) hours.     mupirocin ointment (BACTROBAN) 2 % APPLY SMALL AMOUNT TOPICALLY TWO TIMES A DAY EROSIONS APPLY THIN LAYER ONTO EROSIONS ON RIGHT FOREARM     mycophenolate (MYFORTIC) 180 MG EC tablet Take 180 mg by mouth 2 (two) times daily.     polyethylene glycol (MIRALAX / GLYCOLAX) 17 g packet Take 17 g by mouth daily. 14 each 0   sodium bicarbonate 650 MG tablet Take 1 tablet by mouth 3 (three) times daily.     tamsulosin (FLOMAX) 0.4 MG CAPS capsule Take 0.8 mg by mouth at bedtime.     acetaminophen (TYLENOL) 325 MG tablet Take 2 tablets by mouth every 6 (six) hours as needed for moderate pain or mild pain.     diphenhydrAMINE (BENADRYL) 25 MG tablet Take 25 mg by mouth every 6 (six) hours as needed.       Discharge Medications: Please see discharge summary for a list of discharge medications.  Relevant Imaging Results:  Relevant Lab Results:   Additional Information SS #[id number]  Shelbie Hutching, RN

## 2021-11-21 NOTE — Assessment & Plan Note (Signed)
Repeat echocardiogram results pending.  Echocardiogram done from January of this year notes preserved ejection fraction and indeterminate diastolic function.  Patient with BNP of 1500 on admission.  Given 1 dose of Lasix then.  Following blood transfusion, have ordered second dose of IV Lasix.  Likely some of this is falsely elevated due to renal failure, although patient certainly does have component of acute heart failure.  Likely would benefit from low-dose daily Lasix in order 20 mg p.o. daily.  Note that patient himself is asymptomatic and able to lie flat on bed and is not orthopneic and is not hypoxic.

## 2021-11-21 NOTE — TOC Transition Note (Signed)
Transition of Care [facility]) - CM/SW Discharge Note   Patient Details  Name: Jared Gregory MRN: [id number] Date of Birth: May 16, 1955  Transition of Care [facility]) CM/SW Contact:  Shelbie Hutching, RN Phone Number: 11/21/2021, 4:00 PM   Clinical Narrative:    Patient placed under observation for symptomatic anemia, he was given blood and is now medically cleared for discharge back to [facility] where he is a long term resident.  RNCM met with patient at the bedside, he asked that I update his wife on discharge back to [facility].  RNCM notified wife of discharge today.  ED RN will call report and ED secretary arranging EMS transport.    Final next level of care: Skilled Nursing Facility Barriers to Discharge: Barriers Resolved   Patient Goals and CMS Choice   CMS Medicare.gov Compare Post Acute Care list provided to:: Patient Choice offered to / list presented to : Patient  Discharge Placement                       Discharge Plan and Services   Discharge Planning Services: CM Consult Post Acute Care Choice: Resumption of Svcs/PTA Provider          DME Arranged: N/A DME Agency: NA       HH Arranged: NA HH Agency: NA        Social Determinants of Health (SDOH) Interventions     Readmission Risk Interventions    01/18/2021    1:48 PM  Readmission Risk Prevention Plan  Transportation Screening Complete  PCP or Specialist Appt within 3-5 Days Complete  HRI or Home Care Consult Complete  Social Work Consult for [place] Planning/Counseling Not Complete  SW consult not completed comments RNCM assigned  Palliative Care Screening Complete  Medication Review Press photographer) Complete

## 2021-11-21 NOTE — Assessment & Plan Note (Signed)
Continue amlodipine, Coreg and hydralazine

## 2021-11-21 NOTE — ED Provider Notes (Signed)
[facility] Provider Note    Event Date/Time   First MD Initiated Contact with Patient 11/21/21 [phone]     (approximate)   History   abnormal labs   HPI  Jared Gregory is a 66 y.o. male here with abnormal labs.  The patient has a history of CKD status post renal transplant, A-fib, chronic anemia, and states he was sent here because his lab work showed anemia.  He states he was actually due to get an EPO shot this week.  He states that he has otherwise felt generally well.  He states he had routine lab work drawn and was told that his hemoglobin less than 7 that he needed to come to the ED for transfusion.  He has had received transfusions before.  Denies any known blood loss.  Denies any blood in his stools that he is aware of.  No vomiting.  He is on chronic anticoagulation due to history of A-fib.     Physical Exam   Triage Vital Signs: ED Triage Vitals [11/20/21 1836]  Enc Vitals Group     BP (!) 146/94     Pulse Rate 74     Resp 16     Temp 98.2 F (36.8 C)     Temp Source Oral     SpO2 95 %     Weight      Height      Head Circumference      Peak Flow      Pain Score 0     Pain Loc      Pain Edu?      Excl. in [place]?     Most recent vital signs: Vitals:   11/21/21 0500 11/21/21 0633  BP: (!) 175/84 (!) 170/86  Pulse: 73 74  Resp: 16 16  Temp:  97.6 F (36.4 C)  SpO2: 100% 100%     General: Awake, no distress.  CV:  Good peripheral perfusion.  Regular rate and rhythm.  No murmurs. Resp:  Normal effort.  Abd:  No distention.  No tenderness.  No rebound or guarding. Other:  Trace bilateral lower extremity edema.   ED Results / Procedures / Treatments   Labs (all labs ordered are listed, but only abnormal results are displayed) Labs Reviewed  CBC WITH DIFFERENTIAL/PLATELET - Abnormal; Notable for the following components:      Result Value   RBC 3.22 (*)    Hemoglobin 7.3 (*)    HCT 23.7 (*)    MCV 73.6 (*)    MCH 22.7 (*)     RDW 22.6 (*)    Lymphs Abs 0.3 (*)    All other components within normal limits  BASIC METABOLIC PANEL - Abnormal; Notable for the following components:   CO2 21 (*)    Glucose, Bld 185 (*)    BUN 74 (*)    Creatinine, Ser 3.42 (*)    Calcium 8.4 (*)    GFR, Estimated 19 (*)    All other components within normal limits  PROTIME-INR - Abnormal; Notable for the following components:   Prothrombin Time 18.9 (*)    INR 1.6 (*)    All other components within normal limits  CBC - Abnormal; Notable for the following components:   WBC 3.8 (*)    RBC 3.08 (*)    Hemoglobin 7.2 (*)    HCT 22.7 (*)    MCV 73.7 (*)    MCH 23.4 (*)    RDW  22.5 (*)    All other components within normal limits  BRAIN NATRIURETIC PEPTIDE - Abnormal; Notable for the following components:   B Natriuretic Peptide 1,687.6 (*)    All other components within normal limits  TROPONIN I (HIGH SENSITIVITY) - Abnormal; Notable for the following components:   Troponin I (High Sensitivity) 30 (*)    All other components within normal limits  HEMOGLOBIN  HEMOGLOBIN A1C  OCCULT BLOOD X 1 CARD TO LAB, STOOL  TYPE AND SCREEN  PREPARE RBC (CROSSMATCH)     EKG Normal sinus rhythm, VR 75. PR 167, QRS 96, QTc 467. No acute ST elevations. LAFB.   RADIOLOGY CXR: Cardiomegaly with vascular congestion c/w CHF   I also independently reviewed and agree with radiologist interpretations.   PROCEDURES:  Critical Care performed: Yes, see critical care procedure note(s)  .Critical Care  Performed by: Duffy Bruce, MD Authorized by: Duffy Bruce, MD   Critical care provider statement:    Critical care time (minutes):  35   Critical care time was exclusive of:  Separately billable procedures and treating other patients   Critical care was necessary to treat or prevent imminent or life-threatening deterioration of the following conditions:  Cardiac failure, circulatory failure and respiratory failure   Critical  care was time spent personally by me on the following activities:  Ordering and review of radiographic studies, ordering and review of laboratory studies, ordering and performing treatments and interventions, pulse oximetry, re-evaluation of patient's condition, discussions with consultants and review of old charts   Care discussed with: admitting provider and accepting provider at another facility       [place] ED: Medications  amLODipine (NORVASC) tablet 10 mg (has no administration in time range)  atorvastatin (LIPITOR) tablet 10 mg (has no administration in time range)  DULoxetine (CYMBALTA) DR capsule 60 mg (has no administration in time range)  finasteride (PROSCAR) tablet 5 mg (has no administration in time range)  tamsulosin (FLOMAX) capsule 0.8 mg (has no administration in time range)  ferrous sulfate tablet 325 mg (has no administration in time range)  folic acid (FOLVITE) tablet 1 mg (has no administration in time range)  acetaminophen (TYLENOL) tablet 650 mg (has no administration in time range)    Or  acetaminophen (TYLENOL) suppository 650 mg (has no administration in time range)  ondansetron (ZOFRAN) tablet 4 mg (has no administration in time range)    Or  ondansetron (ZOFRAN) injection 4 mg (has no administration in time range)  insulin aspart (novoLOG) injection 0-6 Units (has no administration in time range)  insulin aspart (novoLOG) injection 0-5 Units (has no administration in time range)  furosemide (LASIX) injection 20 mg (20 mg Intravenous Not Given 11/21/21 0410)  0.9 %  sodium chloride infusion (0 mL/hr Intravenous Stopped 11/21/21 0636)  carvedilol (COREG) tablet 25 mg (25 mg Oral Given 11/21/21 0234)  cycloSPORINE (SANDIMMUNE) capsule 75 mg (75 mg Oral Given 11/21/21 0236)  hydrALAZINE (APRESOLINE) tablet 75 mg (75 mg Oral Given 11/21/21 0234)  mycophenolate (MYFORTIC) EC tablet 180 mg (180 mg Oral Given 11/21/21 0236)  sodium bicarbonate tablet  650 mg (650 mg Oral Given 11/21/21 0236)  furosemide (LASIX) injection 20 mg (20 mg Intravenous Given 11/21/21 0234)     IMPRESSION / MDM / [place] / ED COURSE  I reviewed the triage vital signs and the nursing notes.  This patient presents to the ED for concern of anemia, this involves an extensive number of treatment options, and is a complaint that carries with it a high risk of complications and morbidity.  The differential diagnosis includes anemia, IDA vs blood loss, anemia of chronic disease, AoCKD, occult infection (UTI, PNA), hemolysis   Co morbidities that complicate the patient evaluation  AFib HTN DM Renal transplant   Additional history obtained:  Additional history obtained from none External records from outside source obtained and reviewed including D/c summary from 08/2021   Lab Tests:  I Ordered, and personally interpreted labs.  The pertinent results include:   Hgb 7.2, last 8.1 on 09/09/21 INR 1.6 BNP 1687.6 Trop 30 (baseline) Cr 3.4, slightly down from last 3.85 in 09/09/21   Imaging Studies ordered:  I ordered imaging studies including CXR  I independently visualized and interpreted imaging which showed: CXR shows CHF, fluid overload I agree with the radiologist interpretation   Cardiac Monitoring: / EKG:  The patient was maintained on a cardiac monitor.  I personally viewed and interpreted the cardiac monitored which showed an underlying rhythm of: NSR   Problem List / ED Course / Critical interventions / Medication management  Anemia Likely acute on chronic in setting of slow GI losses, renal failure, anemia of chronic disease. Hgb 7.2 but pt has underlying CHF, with active exacerbation at this time. Will give 1u PRBC and lasix for diuresis, admit for monitoring given his comorbidities. I ordered medication including pRBC  for anemia  Reevaluation of the patient after these medicines showed that the  patient improved I have reviewed the patients home medicines and have made adjustments as needed   Social Determinants of Health:  Renal transplant followed at [place] Never smoker   Test / Admission - Considered:  Admit to Hospitalist service   FINAL CLINICAL IMPRESSION(S) / ED DIAGNOSES   Final diagnoses:  None     Rx / DC Orders   ED Discharge Orders     None        Note:  This document was prepared using Dragon voice recognition software and may include unintentional dictation errors.   Duffy Bruce, MD 11/21/21 [phone]

## 2021-11-21 NOTE — ED Notes (Signed)
Called [facility] to check transport... Pt is 5th on list at this time

## 2021-11-21 NOTE — ED Notes (Signed)
Pt brought to ED rm 16 at this time, this RN now assuming care.

## 2021-11-21 NOTE — ED Notes (Signed)
Pt. Provided warm blankets per request. NAD.

## 2021-11-21 NOTE — Assessment & Plan Note (Addendum)
Continue Coreg.  Resume Eliquis on discharge given that there is no evidence of blood loss

## 2021-11-21 NOTE — ED Notes (Signed)
ED Provider at bedside. 

## 2021-11-21 NOTE — H&P (Addendum)
History and Physical    Patient: Jared Gregory IHK:[id number] DOB: 1955/11/15 DOA: 11/21/2021 DOS: the patient was seen and examined on 11/21/2021 PCP: [facility] [place]  Patient coming from: Home  Chief Complaint:  Chief Complaint  Patient presents with   abnormal labs    HPI: Jared Gregory is a 66 y.o. male with medical history significant for s/p of kidney transplantation on immunosuppressants, HTN, HLD, DM, depression with anxiety, atrial fibrillation on Eliquis, BPH, anemia, CKD-4,  MGUS and chronic anemia who presented by EMS [facility] with abnormal labs.  Hemoglobin was 6.  Patient denies shortness of breath but endorses lower extremity edema.  He denies lightheadedness or palpitations.  He has had no vomiting.  Has not noticed black or bloody stool. ED course and data review: BP 146/94 with pulse 74 and otherwise normal vitals.  Repeat hemoglobin here was 7.2, INR 1.6.  Platelets 1 58,000.  Troponin 30 and BNP 1687.  EKG,  Personally viewed and interpreted  Shows sinus at 75With no acute ST-T wave changes.   Chest x-ray was consistent with CHF showing the following: IMPRESSION: 1. Cardiomegaly with increased central vascular engorgement and interstitial edema consistent with CHF or fluid overload. 2. Small pleural effusions. 3. Right infrahilar opacity which could be atelectasis, ground-glass edema, pneumonia or aspiration. 4. Aortic atherosclerosis.  Patient was treated with a dose of IV Lasix and started on transfusion of 1 unit PRBC.  Hospitalist consulted for admission.   Review of Systems: As mentioned in the history of present illness. All other systems reviewed and are negative.  Past Medical History:  Diagnosis Date   Atrial fibrillation ([place])    Diabetes ([place])    HTN (hypertension)    Hyperlipidemia    Renal transplant recipient    Vascular dementia [facility])    Past Surgical History:  Procedure Laterality Date   AVF placement on left arm      KIDNEY TRANSPLANT     Social History:  reports that he has never smoked. He has never used smokeless tobacco. No history on file for alcohol use and drug use.  Allergies  Allergen Reactions   Baclofen    Lisinopril    Remeron [Mirtazapine]    Vancomycin    Zofran [Ondansetron]     Family History  Problem Relation Age of Onset   Heart attack Mother    Schizophrenia Brother     Prior to Admission medications   Medication Sig Start Date End Date Taking? Authorizing Provider  acetaminophen (TYLENOL) 325 MG tablet Take 2 tablets by mouth every 6 (six) hours as needed.    [provider]  amLODipine (NORVASC) 10 MG tablet Take 10 mg by mouth daily. 07/29/20   [provider]  amoxicillin-clavulanate (AUGMENTIN) 250-125 MG tablet Take 1 tablet by mouth every 12 (twelve) hours. 09/06/21   Loletha Grayer, MD  ascorbic acid (VITAMIN C) 500 MG tablet Take 500 mg by mouth daily.    [provider]  atorvastatin (LIPITOR) 10 MG tablet Take 10 mg by mouth daily. 01/19/21   [provider]  carvedilol (COREG) 25 MG tablet Take 25 mg by mouth 2 (two) times daily with a meal.    [provider]  Cholecalciferol (VITAMIN D3) 50 MCG (2000 UT) TABS Take 1 tablet by mouth in the morning and at bedtime. 07/29/20   [provider]  cycloSPORINE (SANDIMMUNE) 25 MG capsule Take 3 capsules by mouth 2 (two) times daily. 07/29/20   [provider]  diclofenac Sodium (VOLTAREN) 1 % GEL Apply 2 g topically 4 (four) times daily.    [provider]  diphenhydrAMINE (BENADRYL) 25 MG tablet Take 25 mg by mouth every 6 (six) hours as needed.    [provider]  DULoxetine (CYMBALTA) 60 MG capsule Take 60 mg by mouth daily. 07/29/20   [provider]  ELIQUIS 5 MG TABS tablet Take 5 mg by mouth 2 (two) times daily. 08/30/20   [provider]  ferrous sulfate 325 (65 FE) MG tablet Take 325 mg by mouth daily with breakfast.     [provider]  finasteride (PROSCAR) 5 MG tablet Take 5 mg by mouth daily. 07/29/20   [provider]  fluticasone furoate-vilanterol (BREO ELLIPTA) 200-25 MCG/ACT AEPB Inhale 1 puff into the lungs daily. 01/20/21   Regalado, Belkys A, MD  folic acid (FOLVITE) 1 MG tablet Take 1 tablet (1 mg total) by mouth daily. 01/20/21   Regalado, Belkys A, MD  hydrALAZINE (APRESOLINE) 50 MG tablet Take 75 mg by mouth 3 (three) times daily.    [provider]  lacosamide 150 MG TABS Take 1 tablet (150 mg total) by mouth 2 (two) times daily. 01/19/21 09/02/21  Regalado, Jerald Kief A, MD  mycophenolate (MYFORTIC) 180 MG EC tablet Take 180 mg by mouth 2 (two) times daily. 08/01/20   [provider]  polyethylene glycol (MIRALAX / GLYCOLAX) 17 g packet Take 17 g by mouth daily. 01/20/21   Regalado, Belkys A, MD  sodium bicarbonate 650 MG tablet Take 1 tablet by mouth 3 (three) times daily. 08/01/20   [provider]  tamsulosin (FLOMAX) 0.4 MG CAPS capsule Take 0.8 mg by mouth daily. 07/29/20   [provider]    Physical Exam: Vitals:   11/21/21 0151 11/21/21 0200 11/21/21 0229 11/21/21 0244  BP: (!) 176/93 (!) 180/94 (!) 168/89 (!) 179/88  Pulse: 73 73 73 74  Resp: 18 (!) '22 20 18  '$ Temp:   97.6 F (36.4 C) 97.8 F (36.6 C)  TempSrc:   Oral   SpO2: 100% 100% 100% 100%   Physical Exam Vitals and nursing note reviewed.  Constitutional:      General: He is not in acute distress. HENT:     Head: Normocephalic and atraumatic.  Cardiovascular:     Rate and Rhythm: Normal rate and regular rhythm.     Heart sounds: Normal heart sounds.  Pulmonary:     Effort: Pulmonary effort is normal.     Breath sounds: Rales present.  Abdominal:     Palpations: Abdomen is soft.     Tenderness: There is no abdominal tenderness.  Musculoskeletal:     Right lower leg: Edema present.     Left lower leg: Edema present.  Neurological:     Mental Status: Mental status is at  baseline.     Labs on Admission: I have personally reviewed following labs and imaging studies  CBC: Recent Labs  Lab 11/20/21 1839 11/21/21 0115  WBC 4.0 3.8*  NEUTROABS 3.2  --   HGB 7.3* 7.2*  HCT 23.7* 22.7*  MCV 73.6* 73.7*  PLT 153 109   Basic Metabolic Panel: Recent Labs  Lab 11/20/21 1839  NA 140  K 4.2  CL 109  CO2 21*  GLUCOSE 185*  BUN 74*  CREATININE 3.42*  CALCIUM 8.4*   GFR: CrCl cannot be calculated (Unknown ideal weight.). Liver Function Tests: No results for input(s): "AST", "ALT", "ALKPHOS", "BILITOT", "PROT", "  ALBUMIN" in the last 168 hours. No results for input(s): "LIPASE", "AMYLASE" in the last 168 hours. No results for input(s): "AMMONIA" in the last 168 hours. Coagulation Profile: Recent Labs  Lab 11/21/21 0115  INR 1.6*   Cardiac Enzymes: No results for input(s): "CKTOTAL", "CKMB", "CKMBINDEX", "TROPONINI" in the last 168 hours. BNP (last 3 results) No results for input(s): "PROBNP" in the last 8760 hours. HbA1C: No results for input(s): "HGBA1C" in the last 72 hours. CBG: No results for input(s): "GLUCAP" in the last 168 hours. Lipid Profile: No results for input(s): "CHOL", "HDL", "LDLCALC", "TRIG", "CHOLHDL", "LDLDIRECT" in the last 72 hours. Thyroid Function Tests: No results for input(s): "TSH", "T4TOTAL", "FREET4", "T3FREE", "THYROIDAB" in the last 72 hours. Anemia Panel: No results for input(s): "VITAMINB12", "FOLATE", "FERRITIN", "TIBC", "IRON", "RETICCTPCT" in the last 72 hours. Urine analysis:    Component Value Date/Time   COLORURINE YELLOW (A) 09/01/2021 1952   APPEARANCEUR HAZY (A) 09/01/2021 1952   LABSPEC 1.011 09/01/2021 1952   PHURINE 7.0 09/01/2021 1952   GLUCOSEU NEGATIVE 09/01/2021 [place] NEGATIVE 09/01/2021 [place] NEGATIVE 09/01/2021 [place] NEGATIVE 09/01/2021 1952   PROTEINUR 100 (A) 09/01/2021 1952   NITRITE NEGATIVE 09/01/2021 1952   LEUKOCYTESUR LARGE (A) 09/01/2021 1952     Radiological Exams on Admission: DG Chest Portable 1 View  Result Date: 11/21/2021 CLINICAL DATA:  Shortness of breath and anemia. EXAM: PORTABLE CHEST 1 VIEW COMPARISON:  Portable chest 09/09/2021 FINDINGS: There is mild-to-moderate cardiomegaly, unchanged. The mitral ring is heavily calcified. There is aortic tortuosity and scattered calcific plaque with stable mediastinum. There is increased central vascular engorgement and flow cephalization. There is mild interstitial edema in the perihilar areas and bases and small pleural effusions, interval increased. There is focal right infrahilar opacity which could be atelectasis, ground-glass edema, pneumonia or aspiration. No other focal lung opacity is seen. Clinical correlation and radiographic follow-up recommended. Osteopenia. IMPRESSION: 1. Cardiomegaly with increased central vascular engorgement and interstitial edema consistent with CHF or fluid overload. 2. Small pleural effusions. 3. Right infrahilar opacity which could be atelectasis, ground-glass edema, pneumonia or aspiration. 4. Aortic atherosclerosis. Electronically Signed   By: Telford Nab M.D.   On: 11/21/2021 02:54     Data Reviewed: Relevant notes from primary care and specialist visits, past discharge summaries as available in EHR, including Care Everywhere. Prior diagnostic testing as pertinent to current admission diagnoses Updated medications and problem lists for reconciliation ED course, including vitals, labs, imaging, treatment and response to treatment Triage notes, nursing and pharmacy notes and ED provider's notes Notable results as noted in HPI   Assessment and Plan: * Symptomatic anemia Possible mild acute CHF Anemia of chronic kidney disease Hemoglobin 7.2, was 6 his facility and patient noted to be fluid overloaded, with troponin 30 and BNP 1687 with CHF findings on chest x-ray No prior history of CHF.  Echo in January showed EF 60 to 65% with indeterminate  diastolic parameters Continue transfusion of 1 unit PRBC.  Patient gets erythropoietin outpatient for chronic anemia Lasix after unit H&H posttransfusion We will get echocardiogram Can consider GI consult  CKD (chronic kidney disease), stage IV (HCC) Renal transplant recipient Renal function at baseline with creatinine 3.42 Continue mycophenolate and cyclosporine  Essential hypertension Continue amlodipine, Coreg and hydralazine  Type II diabetes mellitus with renal manifestations (HCC) Controlled Sliding scale insulin  BPH (benign prostatic hyperplasia) Continue Flomax and Proscar  Atrial fibrillation ([place]) Continue Coreg.  Will hold  Eliquis due to symptomatic anemia of uncertain etiology        DVT prophylaxis: Lovenox  Consults: none  Advance Care Planning:   Code Status: Prior   Family Communication: none  Disposition Plan: Back to previous home environment  Severity of Illness: The appropriate patient status for this patient is OBSERVATION. Observation status is judged to be reasonable and necessary in order to provide the required intensity of service to ensure the patient's safety. The patient's presenting symptoms, physical exam findings, and initial radiographic and laboratory data in the context of their medical condition is felt to place them at decreased risk for further clinical deterioration. Furthermore, it is anticipated that the patient will be medically stable for discharge from the hospital within 2 midnights of admission.   Author: Athena Masse, MD 11/21/2021 3:06 AM  For on call review [email or web address].

## 2021-11-21 NOTE — Assessment & Plan Note (Signed)
Controlled Sliding scale insulin

## 2021-11-21 NOTE — Assessment & Plan Note (Signed)
Continue statin. 

## 2021-11-21 NOTE — Assessment & Plan Note (Addendum)
Renal transplant recipient Renal function at baseline with creatinine 3.42 Continue mycophenolate and cyclosporine.  Following blood transfusion, creatinine slightly improved at 3.2.  Starting low-dose daily Lasix.  Patient states that he has an appointment with his primary care physician at the [facility] in 1 week.  At that time, recommending repeat basic metabolic panel be checked.

## 2021-11-21 NOTE — Assessment & Plan Note (Signed)
Continue immunosuppressive medications

## 2021-11-21 NOTE — Assessment & Plan Note (Addendum)
No evidence of acute blood loss, given negative Hemoccult, stable blood pressure and no tachycardia.  Hemoglobin on admission elevated to 7.2, up from 6.  Suspect that this is secondary to anemia of chronic disease, likely in part due to his renal failure and possibly from MGUS as well.  Status post 1 unit packed red blood cells.  Follow-up hemoglobin this morning after transfusion at 8.6.  No need for GI evaluation.  No need for endoscopy or colonoscopy.  Continue erythropoietin as outpatient.

## 2021-11-21 NOTE — Hospital Course (Signed)
    CKD (chronic kidney disease), stage IV (HCC) Worsening baseline.  Recent baseline creatinine 3.76, BUN 4.31 -Consulted Dr.  Theador Hawthorne of nephrology -Hold torsemide   Renal transplant recipient Patient is taking mycophenolate and cyclosporine.  US-renal showed transplanted kidney with moderate pelvicaliectasis/hydronephrosis, increased since March 14, 2021 ultrasound imaging. Bladder scan showed 200 retention. No obstructive stone. UA is positive for UTI   -Continue home mycophenolate and cyclosporine -Check cyclosporine level -will place Foley cath -pt is on unasyn (received 1 dose of Rocephin earlier in ED)       Essential hypertension - IV hydralazine as needed -Amlodipine, Coreg, hydralazine -Hold torsemide due to worsening renal function   Seizure-like activity (HCC) - Seizure precaution -As needed Ativan for seizure -Continue home lacosamide   HLD (hyperlipidemia) - Lipitor   Type II diabetes mellitus with renal manifestations (HCC) Recent A1c 5.2, well controlled.  Patient is taking Tradjenta at home -Sliding scale insulin   BPH (benign prostatic hyperplasia) - Flomax and Proscar   Nausea & vomiting Possibly due to UTI.  -As needed Zofran   Anemia in chronic kidney disease (CKD) Hemoglobin 7.1 (7.9 on 03/14/2021) -Continue iron supplement   Depression - Continue home medications   UTI (urinary tract infection) -on IV Unasyn (received 1 dose of Rocephin in ED) -Follow-up urine culture   Chronic a-fib (HCC) Heart rate 89 -Continue Eliquis, Coreg

## 2021-11-21 NOTE — ED Notes (Signed)
[facility]  CALLED  FOR  TRANSPORT  TO  [facility] 

## 2021-11-21 NOTE — Assessment & Plan Note (Signed)
Continue Flomax and Proscar

## 2021-11-21 NOTE — Progress Notes (Signed)
*  PRELIMINARY RESULTS* Echocardiogram 2D Echocardiogram has been performed.  Wallie Char Lavita Pontius 11/21/2021, 1:39 PM

## 2021-11-21 NOTE — Discharge Summary (Signed)
Physician Discharge Summary   Patient: Jared Gregory MRN: [id number] DOB: 18-Feb-1955  Admit date:     11/21/2021  Discharge date: 11/21/21  Discharge Physician: Annita Brod   PCP: [facility] [place]   Recommendations at discharge:   New medication: Lasix 20 mg p.o. daily Patient to resume all of his previous medications upon discharge Patient returning back to skilled nursing facility Patient will follow-up with his primary care physician at the [facility] has appointment scheduled for next week  Discharge Diagnoses: Principal Problem:   Symptomatic anemia Active Problems:   Acute diastolic (congestive) heart failure (HCC)   Atrial fibrillation ([place])   HLD (hyperlipidemia)   CKD (chronic kidney disease), stage IV ([place])   Renal transplant recipient   Type II diabetes mellitus with renal manifestations ([place])   Essential hypertension   BPH (benign prostatic hyperplasia)  Resolved Problems:   * No resolved hospital problems. *  Hospital Course: 66 year old male with past medical history of hypertension, diabetes mellitus, atrial fibrillation on Eliquis, MGUS and kidney transplantation on immunosuppressive medications, now with stage IV chronic kidney disease who presented to the emergency room on 11/13 after lab draws there found he had a hemoglobin of 6.  Repeat hemoglobin here at 7.2.  Patient with no signs of acute blood loss anemia with stable vitals noted 1.6 and BNP of 1687.  Patient given 1 dose of IV Lasix in ordered 1 unit packed red blood cell transfusion.  Assessment and Plan: * Symptomatic anemia No evidence of acute blood loss, given negative Hemoccult, stable blood pressure and no tachycardia.  Hemoglobin on admission elevated to 7.2, up from 6.  Suspect that this is secondary to anemia of chronic disease, likely in part due to his renal failure and possibly from MGUS as well.  Status post 1 unit packed red blood cells.  Follow-up hemoglobin this morning after  transfusion at 8.6.  No need for GI evaluation.  No need for endoscopy or colonoscopy.  Continue erythropoietin as outpatient.    Acute diastolic (congestive) heart failure (HCC) Repeat echocardiogram results pending.  Echocardiogram done from January of this year notes preserved ejection fraction and indeterminate diastolic function.  Patient with BNP of 1500 on admission.  Given 1 dose of Lasix then.  Following blood transfusion, have ordered second dose of IV Lasix.  Likely some of this is falsely elevated due to renal failure, although patient certainly does have component of acute heart failure.  Likely would benefit from low-dose daily Lasix in order 20 mg p.o. daily.  Note that patient himself is asymptomatic and able to lie flat on bed and is not orthopneic and is not hypoxic.  Atrial fibrillation ([place]) Continue Coreg.  Resume Eliquis on discharge given that there is no evidence of blood loss  CKD (chronic kidney disease), stage IV ([place]) Renal transplant recipient Renal function at baseline with creatinine 3.42 Continue mycophenolate and cyclosporine.  Following blood transfusion, creatinine slightly improved at 3.2.  Starting low-dose daily Lasix.  Patient states that he has an appointment with his primary care physician at the [facility] in 1 week.  At that time, recommending repeat basic metabolic panel be checked.  HLD (hyperlipidemia) Continue statin  Renal transplant recipient Continue immunosuppressive medications  Type II diabetes mellitus with renal manifestations (HCC) Controlled Sliding scale insulin  Essential hypertension Continue amlodipine, Coreg and hydralazine  BPH (benign prostatic hyperplasia) Continue Flomax and Proscar         Consultants: None Procedures performed:  -Echocardiogram, results  pending -Status post 1 unit packed red blood cell transfusion 11/13  Disposition: Skilled nursing Diet recommendation:  Discharge Diet Orders (From admission,  onward)     Start     Ordered   11/21/21 0000  Diet - low sodium heart healthy        11/21/21 1436           Heart healthy, carb modified DISCHARGE MEDICATION: Allergies as of 11/21/2021       Reactions   Baclofen    Lisinopril    Remeron [mirtazapine]    Vancomycin    Zofran [ondansetron]         Medication List     TAKE these medications    acetaminophen 325 MG tablet Commonly known as: TYLENOL Take 2 tablets by mouth every 6 (six) hours as needed for moderate pain or mild pain.   amLODipine 10 MG tablet Commonly known as: NORVASC Take 10 mg by mouth daily.   ascorbic acid 500 MG tablet Commonly known as: VITAMIN C Take 500 mg by mouth daily.   atorvastatin 10 MG tablet Commonly known as: LIPITOR Take 10 mg by mouth daily.   carvedilol 25 MG tablet Commonly known as: COREG Take 25 mg by mouth 2 (two) times daily with a meal.   cycloSPORINE 25 MG capsule Commonly known as: SANDIMMUNE Take 3 capsules by mouth 2 (two) times daily. Please give at 0900 and 2100 per Transplant Team.   diclofenac Sodium 1 % Gel Commonly known as: VOLTAREN Apply 2 g topically 4 (four) times daily. Apply to right great toe.   diphenhydrAMINE 25 MG tablet Commonly known as: BENADRYL Take 25 mg by mouth every 6 (six) hours as needed.   DULoxetine 60 MG capsule Commonly known as: CYMBALTA Take 60 mg by mouth daily.   Eliquis 5 MG Tabs tablet Generic drug: apixaban Take 5 mg by mouth 2 (two) times daily.   epoetin alfa 10000 UNIT/ML injection Commonly known as: EPOGEN Inject 10,000 Units into the skin every Wednesday.   ferrous sulfate 324 (65 Fe) MG Tbec Take 324 mg by mouth daily. Take at lunch time along with vitamin C.   finasteride 5 MG tablet Commonly known as: PROSCAR Take 5 mg by mouth daily.   fluticasone furoate-vilanterol 200-25 MCG/ACT Aepb Commonly known as: BREO ELLIPTA Inhale 1 puff into the lungs daily.   folic acid 1 MG tablet Commonly known  as: FOLVITE Take 1 tablet (1 mg total) by mouth daily.   furosemide 20 MG tablet Commonly known as: Lasix Take 1 tablet (20 mg total) by mouth daily.   hydrALAZINE 25 MG tablet Commonly known as: APRESOLINE Take 75 mg by mouth 3 (three) times daily.   Lacosamide 150 MG Tabs Take 1 tablet by mouth every 12 (twelve) hours.   mupirocin ointment 2 % Commonly known as: BACTROBAN APPLY SMALL AMOUNT TOPICALLY TWO TIMES A DAY EROSIONS APPLY THIN LAYER ONTO EROSIONS ON RIGHT FOREARM   mycophenolate 180 MG EC tablet Commonly known as: MYFORTIC Take 180 mg by mouth 2 (two) times daily.   polyethylene glycol 17 g packet Commonly known as: MIRALAX / GLYCOLAX Take 17 g by mouth daily.   sodium bicarbonate 650 MG tablet Take 1 tablet by mouth 3 (three) times daily.   tamsulosin 0.4 MG Caps capsule Commonly known as: FLOMAX Take 0.8 mg by mouth at bedtime.   Vitamin D3 50 MCG (2000 UT) Tabs Take 1 tablet by mouth in the morning and at bedtime.  Discharge Exam: There were no vitals filed for this visit. General: Alert and oriented x2-3, no acute distress Cardiovascular: Irregular rhythm, rate controlled Lungs: Decreased breath sounds bibasilar  Condition at discharge: improving  The results of significant diagnostics from this hospitalization (including imaging, microbiology, ancillary and laboratory) are listed below for reference.   Imaging Studies: DG Chest Portable 1 View  Result Date: 11/21/2021 CLINICAL DATA:  Shortness of breath and anemia. EXAM: PORTABLE CHEST 1 VIEW COMPARISON:  Portable chest 09/09/2021 FINDINGS: There is mild-to-moderate cardiomegaly, unchanged. The mitral ring is heavily calcified. There is aortic tortuosity and scattered calcific plaque with stable mediastinum. There is increased central vascular engorgement and flow cephalization. There is mild interstitial edema in the perihilar areas and bases and small pleural effusions, interval increased.  There is focal right infrahilar opacity which could be atelectasis, ground-glass edema, pneumonia or aspiration. No other focal lung opacity is seen. Clinical correlation and radiographic follow-up recommended. Osteopenia. IMPRESSION: 1. Cardiomegaly with increased central vascular engorgement and interstitial edema consistent with CHF or fluid overload. 2. Small pleural effusions. 3. Right infrahilar opacity which could be atelectasis, ground-glass edema, pneumonia or aspiration. 4. Aortic atherosclerosis. Electronically Signed   By: Telford Nab M.D.   On: 11/21/2021 02:54    Microbiology: Results for orders placed or performed during the hospital encounter of 09/09/21  Blood culture (single)     Status: None   Collection Time: 09/09/21  3:14 PM   Specimen: BLOOD  Result Value Ref Range Status   Specimen Description BLOOD BLOOD RIGHT ARM  Final   Special Requests   Final    BOTTLES DRAWN AEROBIC AND ANAEROBIC Blood Culture results may not be optimal due to an excessive volume of blood received in culture bottles   Culture   Final    NO GROWTH 5 DAYS Performed at [facility], [address] [place], [place] [address]    Report Status 09/14/2021 FINAL  Final    Labs: CBC: Recent Labs  Lab 11/20/21 1839 11/21/21 0115 11/21/21 0845  WBC 4.0 3.8*  --   NEUTROABS 3.2  --   --   HGB 7.3* 7.2* 8.4*  HCT 23.7* 22.7*  --   MCV 73.6* 73.7*  --   PLT 153 158  --    Basic Metabolic Panel: Recent Labs  Lab 11/20/21 1839 11/21/21 1158  NA 140 139  K 4.2 4.1  CL 109 110  CO2 21* 20*  GLUCOSE 185* 125*  BUN 74* 74*  CREATININE 3.42* 3.23*  CALCIUM 8.4* 8.4*   Liver Function Tests: No results for input(s): "AST", "ALT", "ALKPHOS", "BILITOT", "PROT", "ALBUMIN" in the last 168 hours. CBG: Recent Labs  Lab 11/21/21 0822 11/21/21 1205  GLUCAP 120* 129*    Discharge time spent: greater than 30 minutes.  Signed: Annita Brod, MD [facility] 11/21/2021

## 2021-11-21 NOTE — Hospital Course (Signed)
66 year old male with past medical history of hypertension, diabetes mellitus, atrial fibrillation on Eliquis, MGUS and kidney transplantation on immunosuppressive medications, now with stage IV chronic kidney disease who presented to the emergency room on 11/13 after lab draws there found he had a hemoglobin of 6.  Repeat hemoglobin here at 7.2.  Patient with no signs of acute blood loss anemia with stable vitals noted 1.6 and BNP of 1687.  Patient given 1 dose of IV Lasix in ordered 1 unit packed red blood cell transfusion.

## 2021-11-22 LAB — TYPE AND SCREEN
ABO/RH(D): B POS
Antibody Screen: NEGATIVE
Unit division: 0

## 2021-11-22 LAB — BPAM RBC
Blood Product Expiration Date: 202312012359
ISSUE DATE / TIME: 202311140222
Unit Type and Rh: 7300

## 2022-01-08 DEATH — deceased

## 2023-07-25 IMAGING — US US RENAL
1 series · 14 of 25 positions shown · non-contrast
Comparison: None.

CLINICAL DATA: Acute renal insufficiency.

EXAM:
RENAL / URINARY TRACT ULTRASOUND COMPLETE

[Series 1: us renal · 61 acquisitions, 14 frames shown]
[im 1/61]
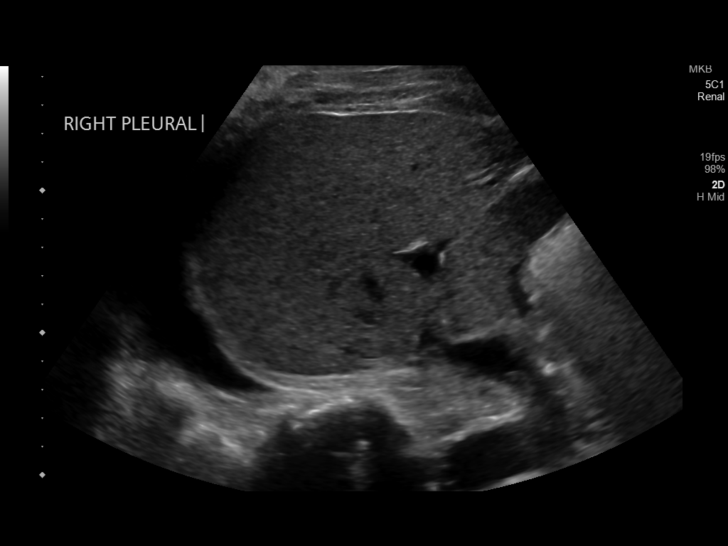
[im 6/61]
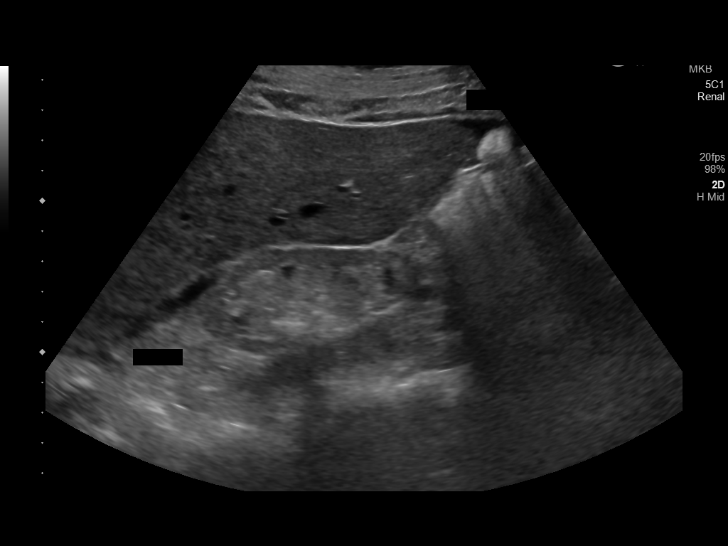
[im 11/61]
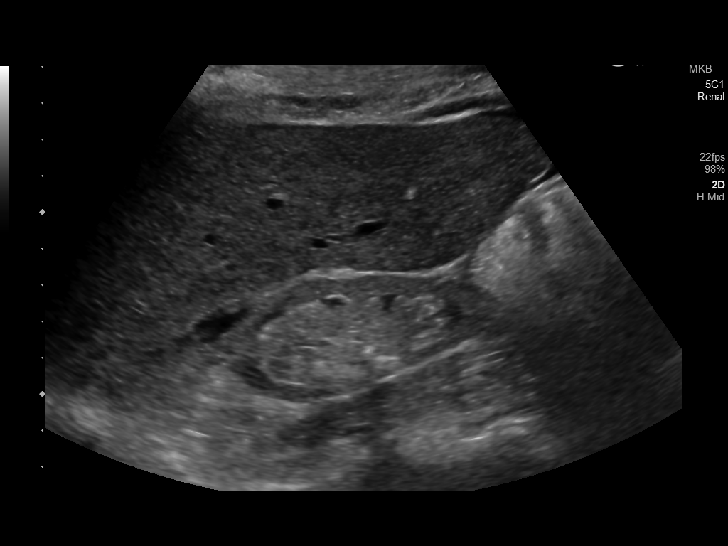
[im 16/61]
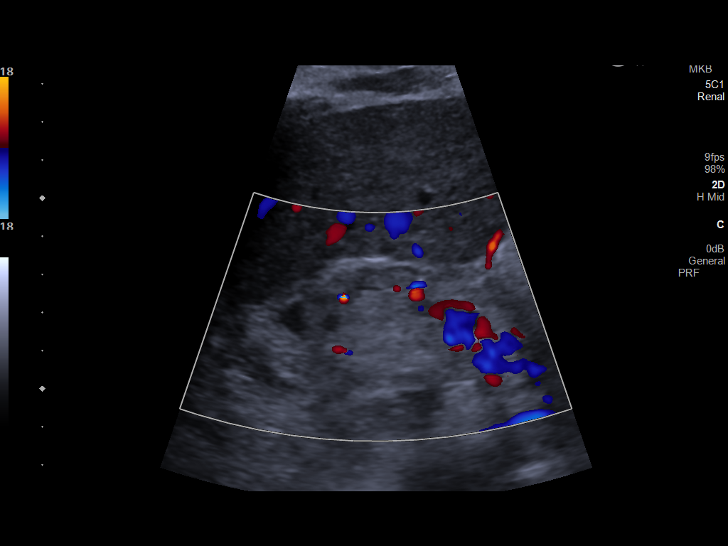
[im 21/61]
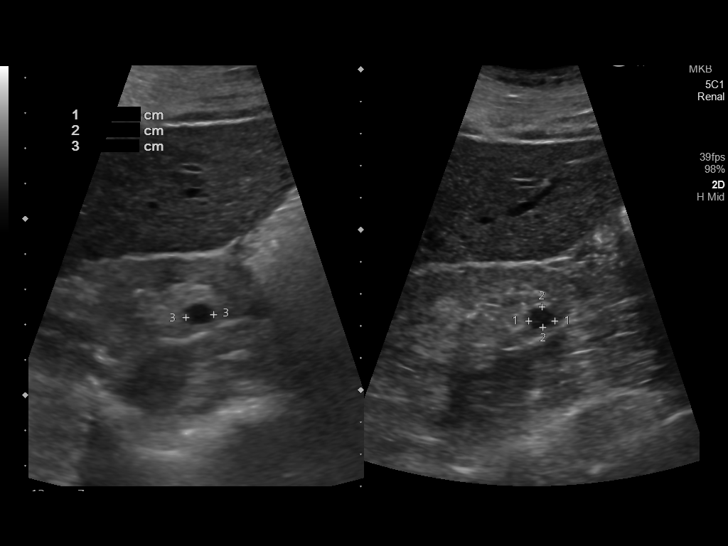
[im 23/61]
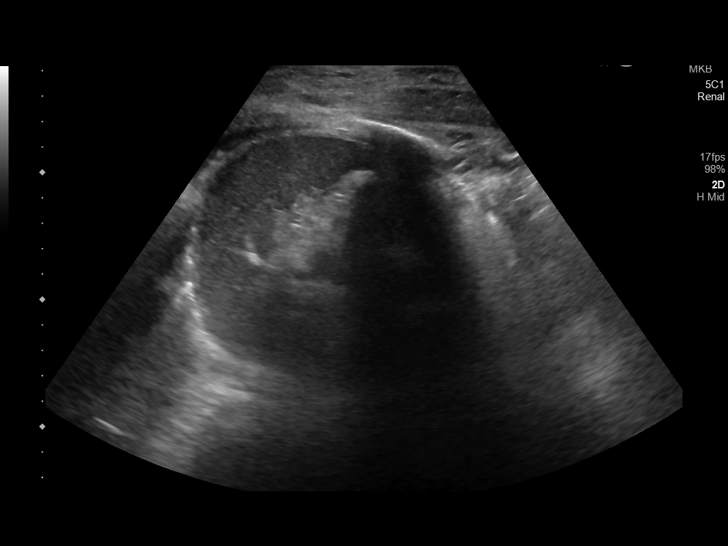
[im 28/61]
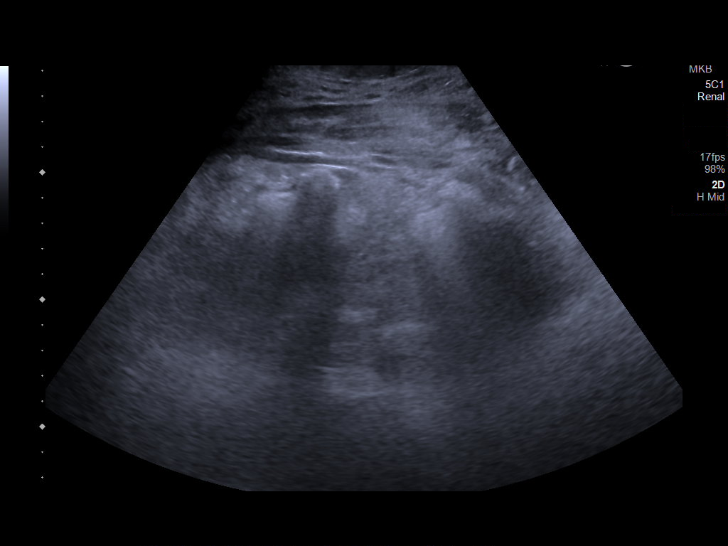
[im 33/61]
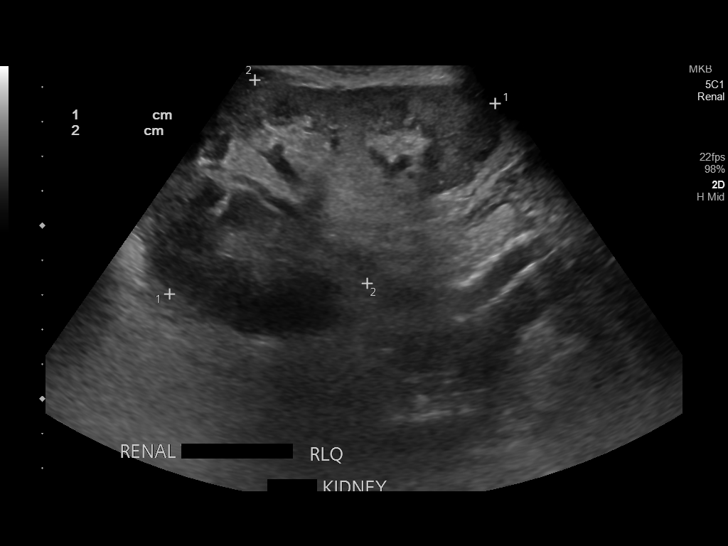
[im 38/61]
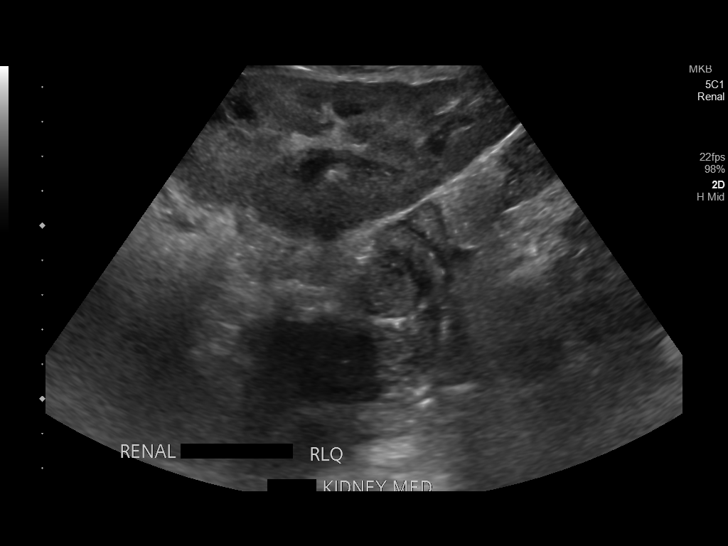
[im 41/61]
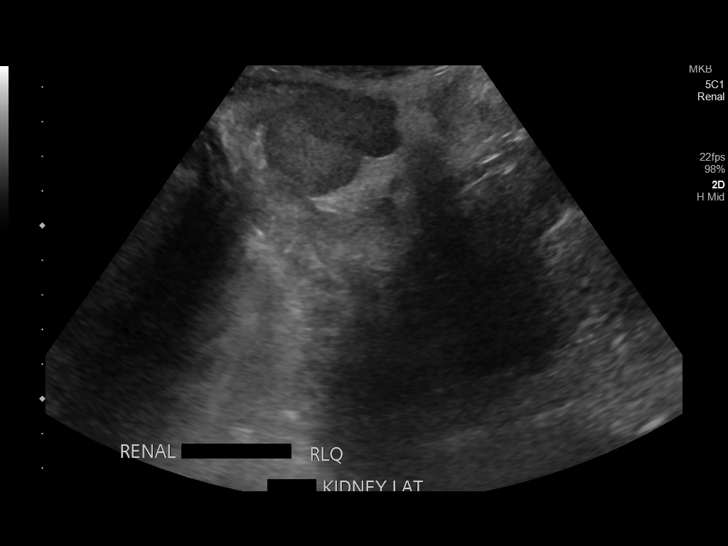
[im 46/61]
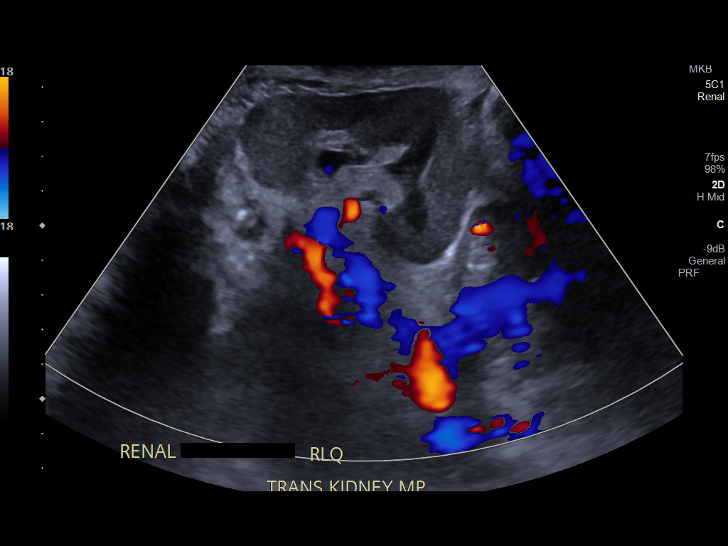
[im 51/61]
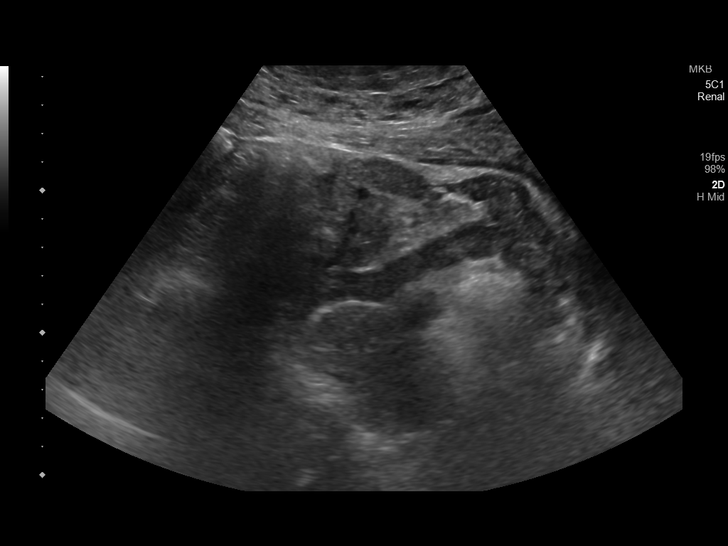
[im 56/61]
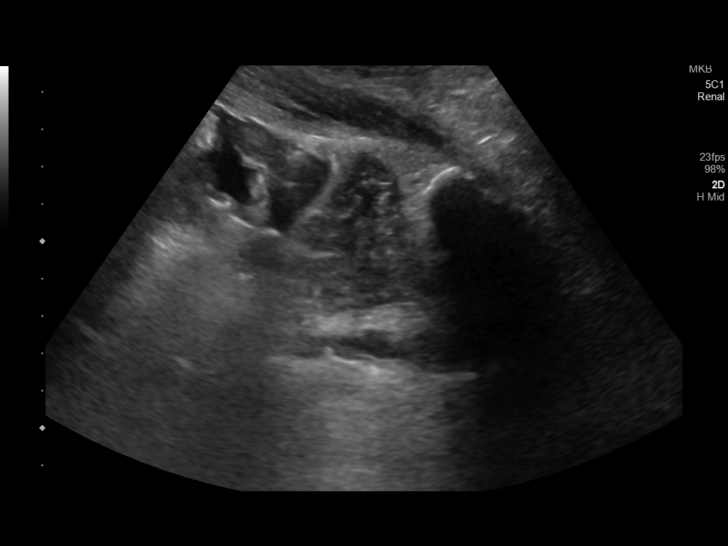
[im 61/61]
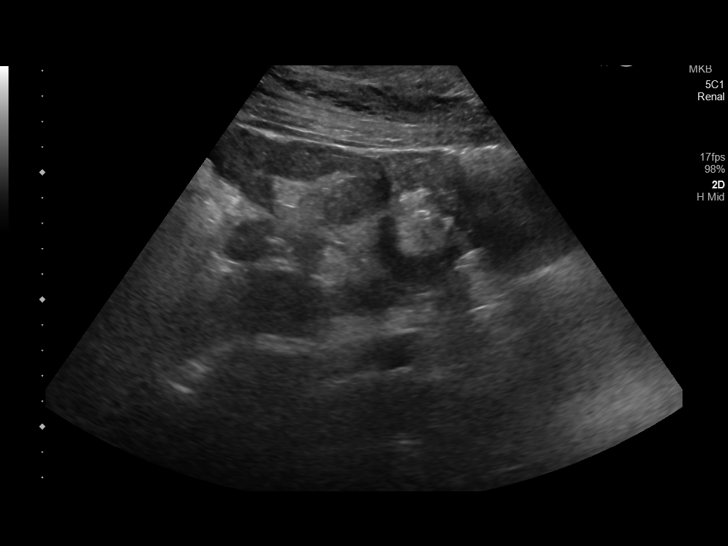

[14 of 25 positions shown; findings below may reference images not displayed]

FINDINGS: Right Kidney:

Renal measurements: 7.4 x 3.1 x 3.8 cm = volume: 46 mL. Moderate
parenchyma atrophy and increased echogenicity. No hydronephrosis or
shadowing stone. Subcentimeter lower pole cyst.

Left Kidney:

The left kidney is poorly visualized.

There is a right lower quadrant renal transplant measuring 10.9 x
6.7 x 5.5 cm. No hydronephrosis or peritransplant collection. The
renal transplant demonstrates a normal echogenicity.

Bladder:

The urinary bladder is decompressed around a Foley catheter.
Diffusely thickened bladder wall may be partly related to
underdistention. Correlation with urinalysis recommended to exclude
cystitis.

Other:

There is a small ascites and a small right pleural effusion.
IMPRESSION: 1. Atrophic and echogenic right kidney in keeping with chronic
kidney disease.
2. Nonvisualization of the left kidney.
3. Unremarkable right lower quadrant renal transplant.
4. Small right pleural effusion and small ascites.

## 2023-09-20 IMAGING — US US RENAL
1 series · 14 of 25 positions shown · non-contrast
Comparison: 01/16/2021

CLINICAL DATA: Acute on chronic kidney disease

EXAM:
RENAL / URINARY TRACT ULTRASOUND COMPLETE

[Series 1: us renal · 14 of 57 slices shown]
[im 1/57]
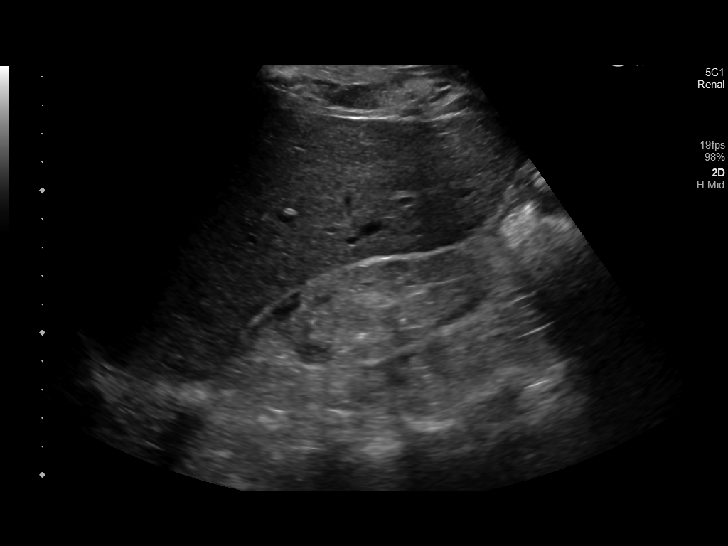
[im 5/57]
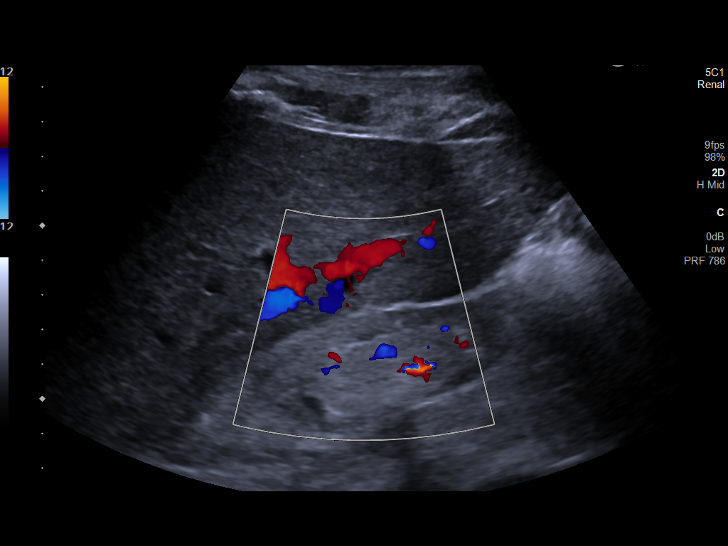
[im 10/57]
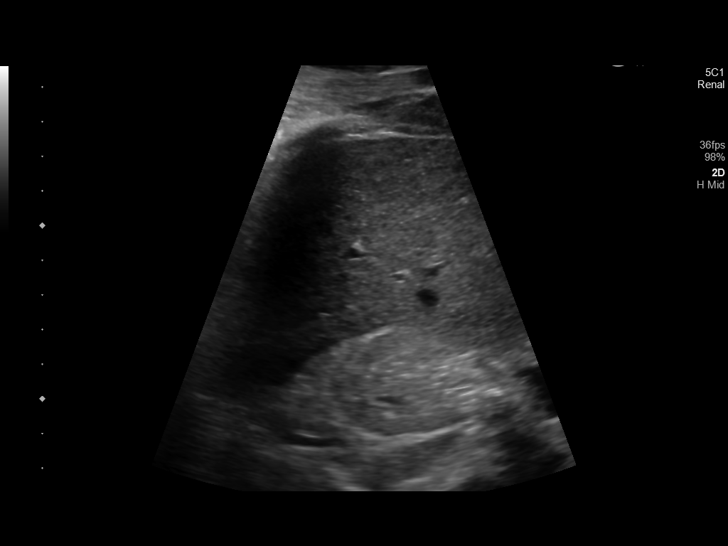
[im 15/57]
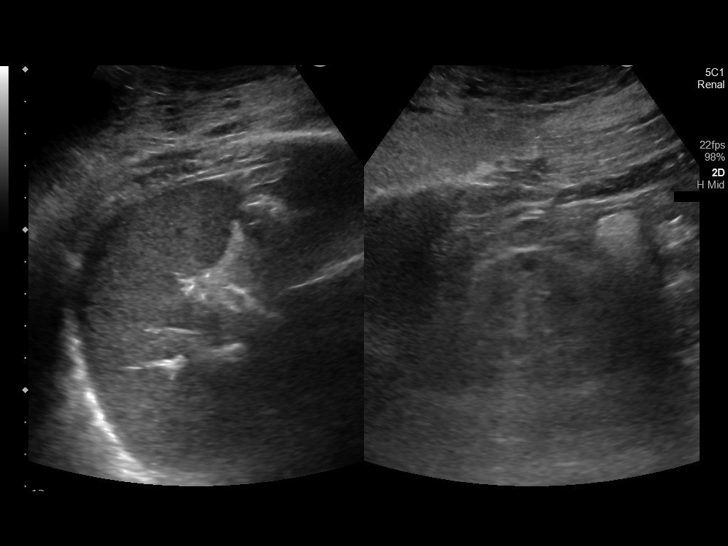
[im 19/57]
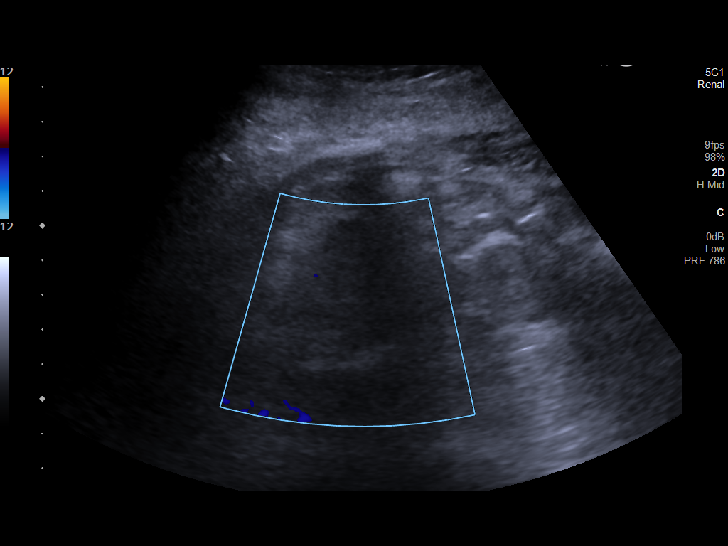
[im 22/57]
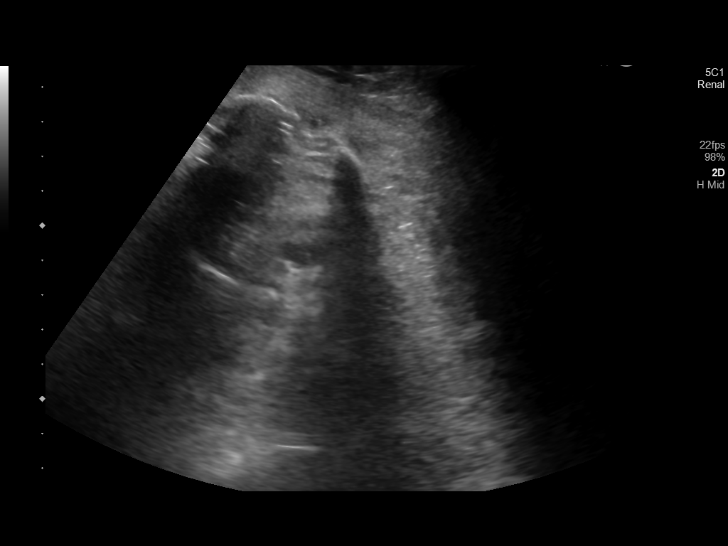
[im 26/57]
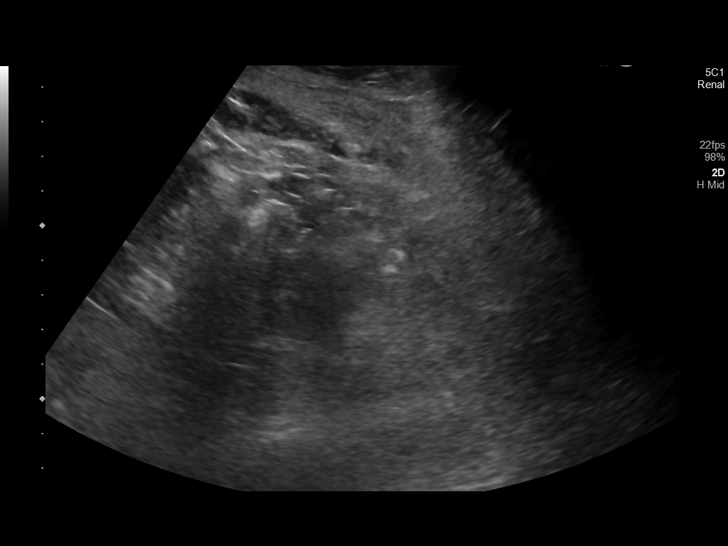
[im 31/57]
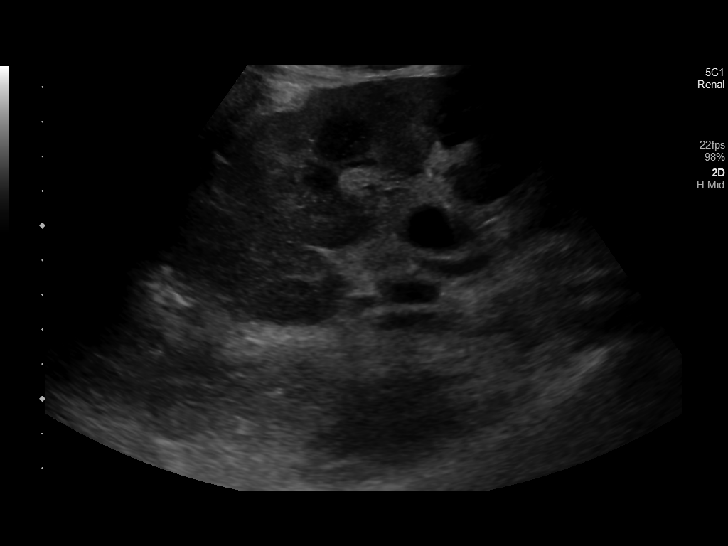
[im 36/57]
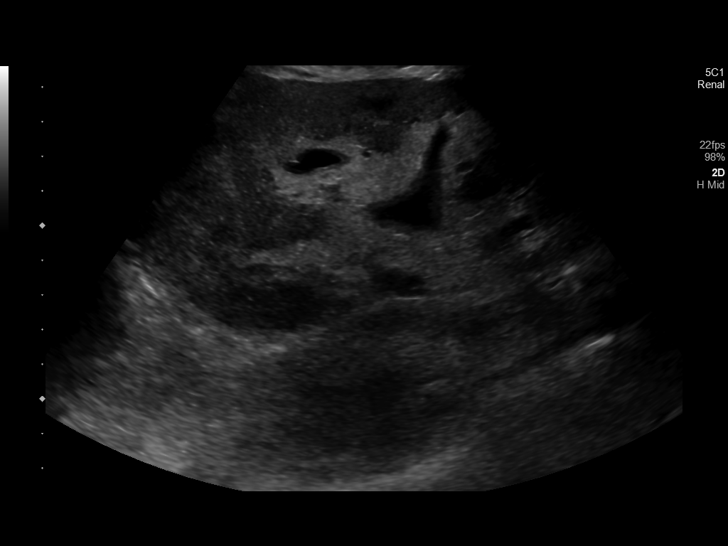
[im 38/57]
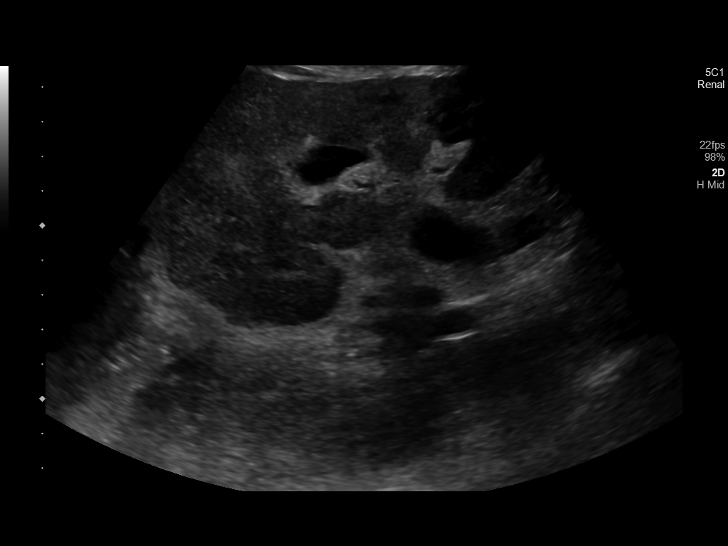
[im 43/57]
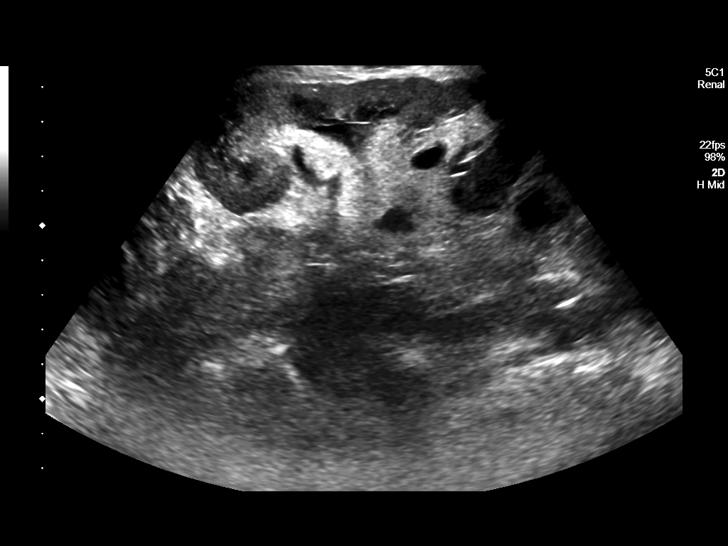
[im 47/57]
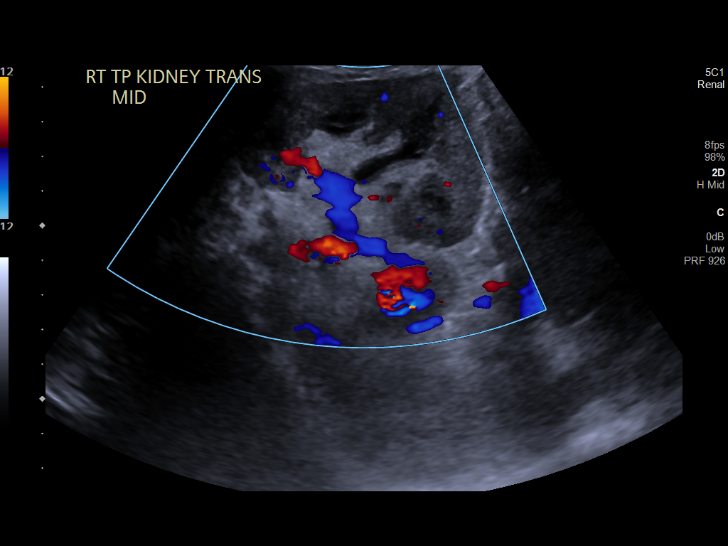
[im 52/57]
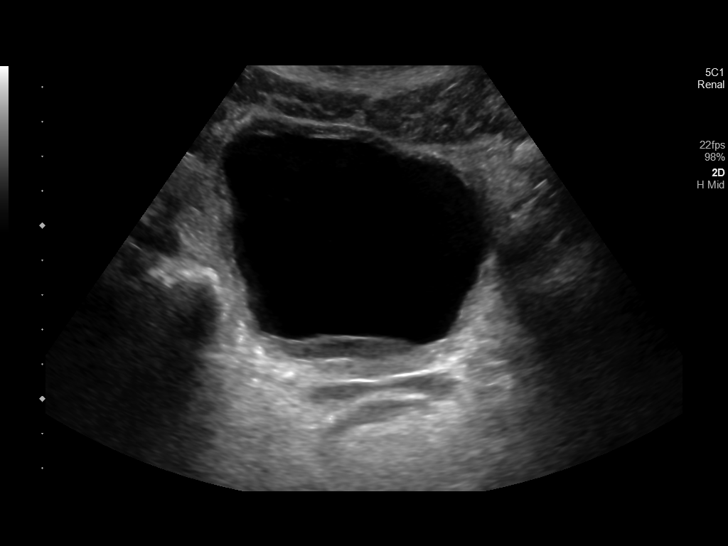
[im 57/57]
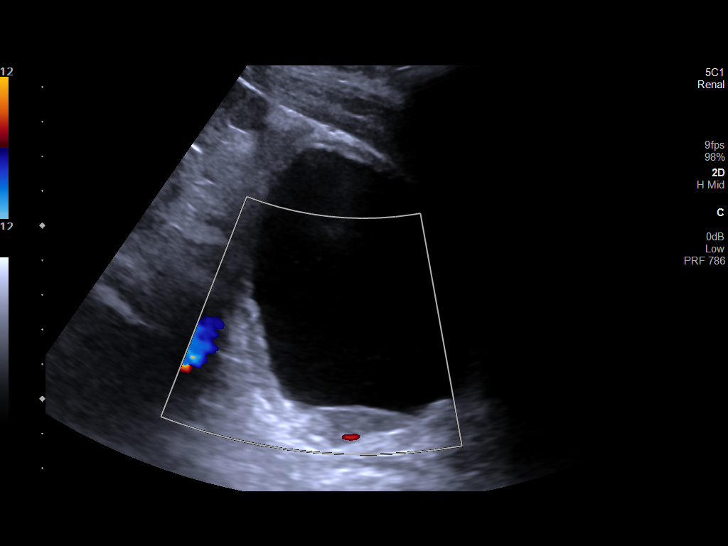

[14 of 25 positions shown; findings below may reference images not displayed]

FINDINGS: Native kidneys:

Right Kidney:

Renal measurements: 7.8 x 2.9 x 3.8 cm = volume: 45 mL. Echogenic
cortex. No mass or hydronephrosis

Left Kidney:

Renal measurements: 5.8 x 3.6 x 3.3 cm = volume: 36 mL. Echogenic
cortex. No hydronephrosis. Subcentimeter cyst.

Right lower quadrant transplant kidney:

Renal measurements: 11.1 x 6 x 5.9 cm. Echogenicity within normal
limits. No mass. Interim mild right hydronephrosis.

Bladder:

Focal wall thickening versus soft tissue mass at the posterior
bladder.

Other:

None.
IMPRESSION: 1. Right lower quadrant transplanted kidney demonstrates interval
mild hydronephrosis.
2. Native kidneys are echogenic and atrophic but without
hydronephrosis
3. Focal wall thickening versus soft tissue mass at the posterior
bladder which could be correlated with direct visualization
